# Patient Record
Sex: Female | Born: 1935 | Race: White | Hispanic: No | Smoking: Never smoker
Health system: Southern US, Community
[De-identification: ages and names within clinical notes are randomized; demographics above are authoritative.]

## PROBLEM LIST (undated history)

## (undated) DIAGNOSIS — M1990 Unspecified osteoarthritis, unspecified site: Secondary | ICD-10-CM

## (undated) DIAGNOSIS — I2510 Atherosclerotic heart disease of native coronary artery without angina pectoris: Secondary | ICD-10-CM

## (undated) DIAGNOSIS — N183 Chronic kidney disease, stage 3 unspecified: Secondary | ICD-10-CM

## (undated) DIAGNOSIS — I779 Disorder of arteries and arterioles, unspecified: Secondary | ICD-10-CM

## (undated) DIAGNOSIS — I4891 Unspecified atrial fibrillation: Secondary | ICD-10-CM

## (undated) DIAGNOSIS — E785 Hyperlipidemia, unspecified: Secondary | ICD-10-CM

## (undated) DIAGNOSIS — N189 Chronic kidney disease, unspecified: Secondary | ICD-10-CM

## (undated) DIAGNOSIS — I5030 Unspecified diastolic (congestive) heart failure: Secondary | ICD-10-CM

## (undated) DIAGNOSIS — I10 Essential (primary) hypertension: Secondary | ICD-10-CM

## (undated) DIAGNOSIS — K219 Gastro-esophageal reflux disease without esophagitis: Secondary | ICD-10-CM

## (undated) DIAGNOSIS — I4821 Permanent atrial fibrillation: Secondary | ICD-10-CM

## (undated) DIAGNOSIS — E039 Hypothyroidism, unspecified: Secondary | ICD-10-CM

## (undated) DIAGNOSIS — D649 Anemia, unspecified: Secondary | ICD-10-CM

## (undated) HISTORY — PX: JOINT REPLACEMENT: SHX530

## (undated) HISTORY — DX: Chronic kidney disease, stage 3 unspecified: N18.30

## (undated) HISTORY — DX: Permanent atrial fibrillation: I48.21

## (undated) HISTORY — PX: NISSEN FUNDOPLICATION: SHX2091

## (undated) HISTORY — PX: APPENDECTOMY: SHX54

## (undated) HISTORY — PX: CARDIAC CATHETERIZATION: SHX172

## (undated) HISTORY — PX: CORONARY STENT PLACEMENT: SHX1402

## (undated) HISTORY — DX: Unspecified diastolic (congestive) heart failure: I50.30

## (undated) HISTORY — DX: Disorder of arteries and arterioles, unspecified: I77.9

## (undated) HISTORY — PX: BACK SURGERY: SHX140

## (undated) HISTORY — PX: THORACOTOMY: SUR1349

## (undated) HISTORY — PX: ABDOMINAL HYSTERECTOMY: SHX81

## (undated) SURGERY — Surgical Case
Anesthesia: *Unknown

---

## 2004-10-03 ENCOUNTER — Ambulatory Visit: Payer: Self-pay | Admitting: Cardiology

## 2004-10-27 ENCOUNTER — Inpatient Hospital Stay: Payer: Self-pay | Admitting: General Practice

## 2004-12-23 ENCOUNTER — Ambulatory Visit: Payer: Self-pay

## 2005-06-25 ENCOUNTER — Ambulatory Visit: Payer: Self-pay

## 2005-08-04 ENCOUNTER — Ambulatory Visit: Payer: Self-pay | Admitting: Unknown Physician Specialty

## 2005-10-19 ENCOUNTER — Ambulatory Visit: Payer: Self-pay | Admitting: Cardiology

## 2005-12-30 ENCOUNTER — Ambulatory Visit: Payer: Self-pay

## 2006-07-09 ENCOUNTER — Ambulatory Visit: Payer: Self-pay | Admitting: Specialist

## 2006-09-29 ENCOUNTER — Ambulatory Visit: Payer: Self-pay | Admitting: Cardiology

## 2006-11-03 ENCOUNTER — Ambulatory Visit: Payer: Self-pay | Admitting: Internal Medicine

## 2006-12-29 ENCOUNTER — Ambulatory Visit: Payer: Self-pay | Admitting: Specialist

## 2007-01-07 ENCOUNTER — Ambulatory Visit: Payer: Self-pay | Admitting: Specialist

## 2007-01-22 ENCOUNTER — Ambulatory Visit: Payer: Self-pay | Admitting: Internal Medicine

## 2007-06-24 ENCOUNTER — Ambulatory Visit: Payer: Self-pay | Admitting: Specialist

## 2007-08-22 ENCOUNTER — Ambulatory Visit: Payer: Self-pay | Admitting: Oncology

## 2007-08-25 ENCOUNTER — Ambulatory Visit: Payer: Self-pay | Admitting: Specialist

## 2007-09-07 ENCOUNTER — Ambulatory Visit: Payer: Self-pay | Admitting: Oncology

## 2007-09-22 ENCOUNTER — Ambulatory Visit: Payer: Self-pay | Admitting: Oncology

## 2007-10-04 ENCOUNTER — Ambulatory Visit: Payer: Self-pay | Admitting: Otolaryngology

## 2007-10-04 ENCOUNTER — Other Ambulatory Visit: Payer: Self-pay

## 2007-11-24 ENCOUNTER — Ambulatory Visit: Payer: Self-pay | Admitting: Otolaryngology

## 2007-11-29 ENCOUNTER — Ambulatory Visit: Payer: Self-pay | Admitting: Otolaryngology

## 2007-12-01 ENCOUNTER — Ambulatory Visit: Payer: Self-pay | Admitting: Otolaryngology

## 2007-12-23 ENCOUNTER — Ambulatory Visit: Payer: Self-pay | Admitting: Internal Medicine

## 2007-12-27 ENCOUNTER — Ambulatory Visit: Payer: Self-pay | Admitting: General Surgery

## 2007-12-28 ENCOUNTER — Ambulatory Visit: Payer: Self-pay | Admitting: Cardiothoracic Surgery

## 2008-01-04 ENCOUNTER — Inpatient Hospital Stay: Payer: Self-pay | Admitting: General Surgery

## 2008-01-25 ENCOUNTER — Ambulatory Visit: Payer: Self-pay | Admitting: General Surgery

## 2008-09-04 ENCOUNTER — Ambulatory Visit: Payer: Self-pay | Admitting: Gastroenterology

## 2008-10-03 IMAGING — CT CT CHEST W/ CM
1 series · 15 of 31 positions shown, 19 images · non-contrast
Comparison: none

REASON FOR EXAM: Pulmonary nodule
COMMENTS:

[Series 2: soft tissue · axial · 0.61mm/px · z∈[+270,+570]mm · 15 of 66 slices shown, 19 images]
[im 3/66  mediastinal]
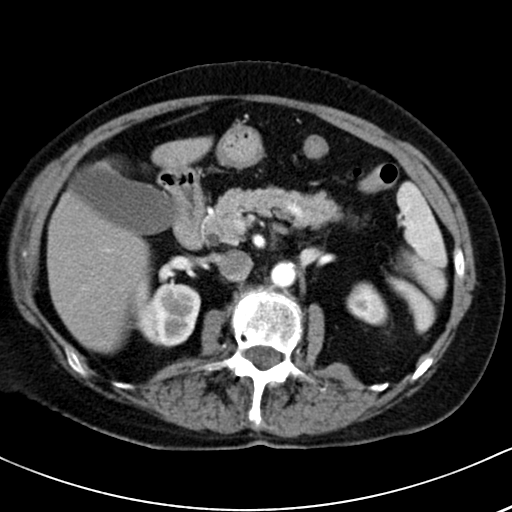
[im 3/66  lung]
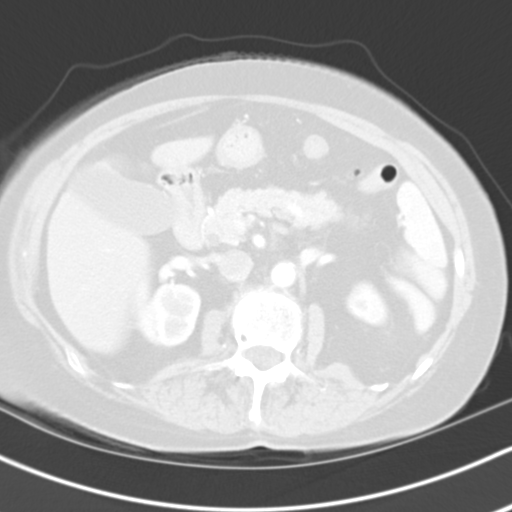
[im 8/66  lung]
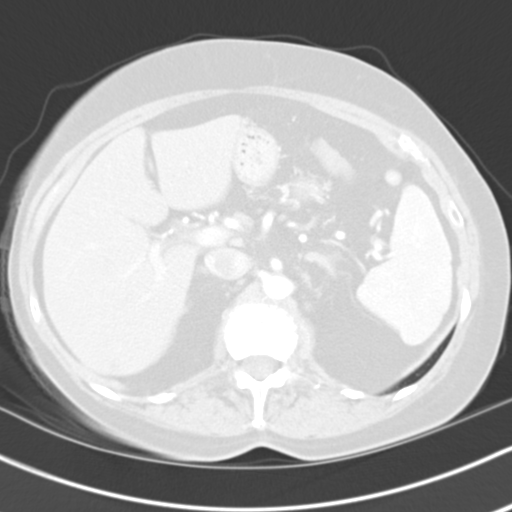
[im 13/66  lung]
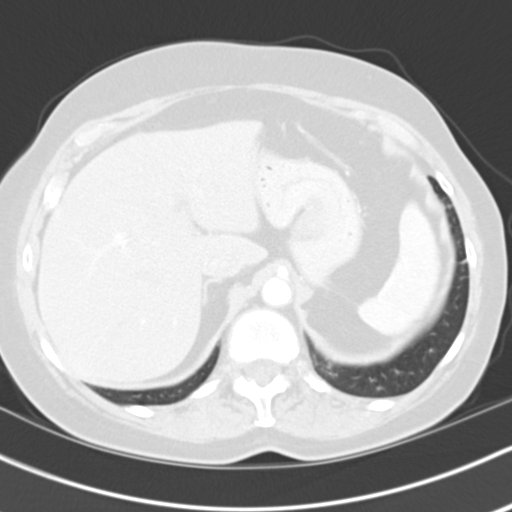
[im 15/66  lung]
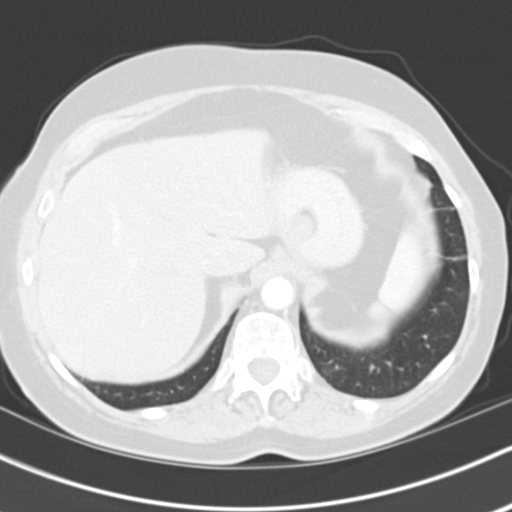
[im 20/66  mediastinal]
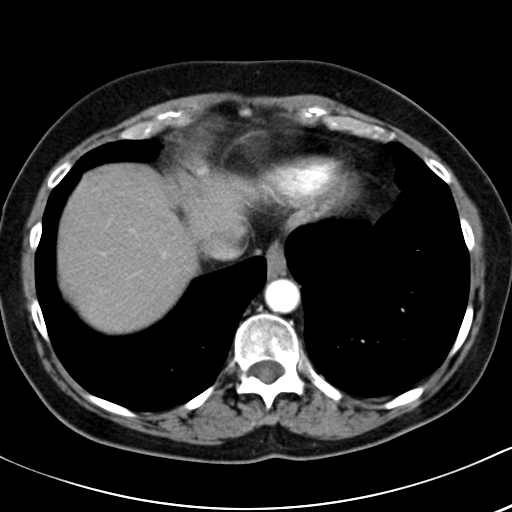
[im 20/66  lung]
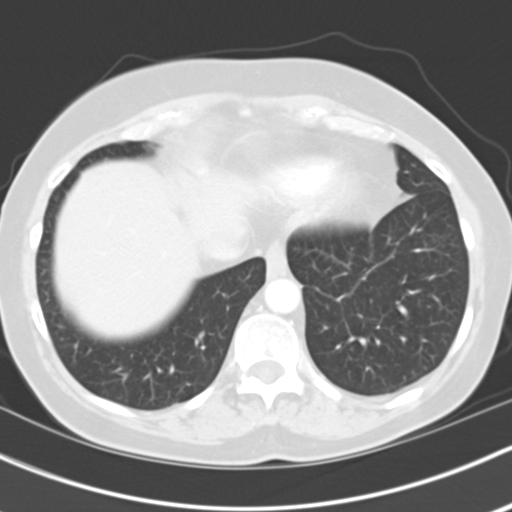
[im 25/66  lung]
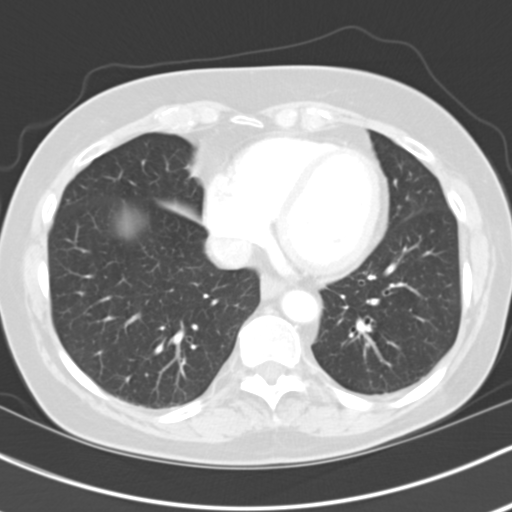
[im 29/66  lung]
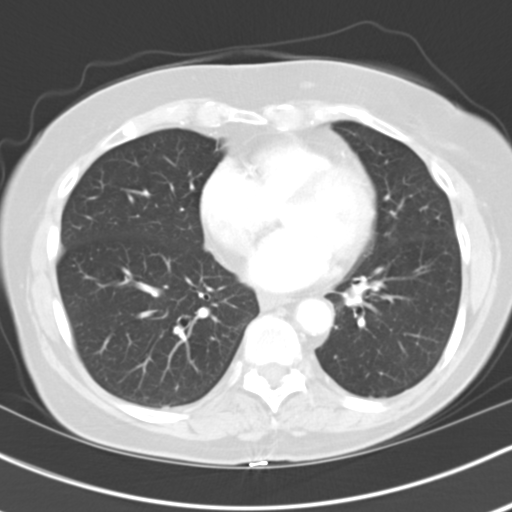
[im 34/66  lung]
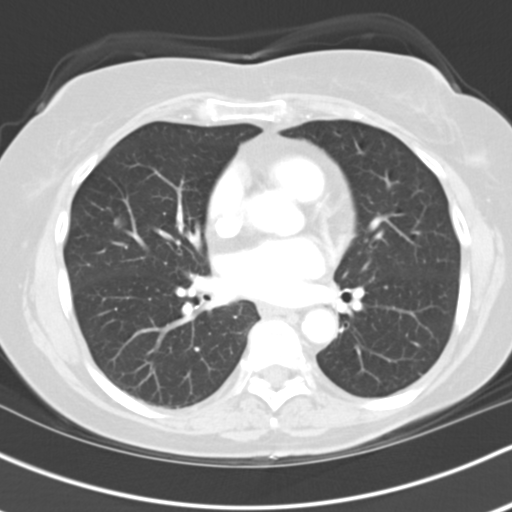
[im 37/66  mediastinal]
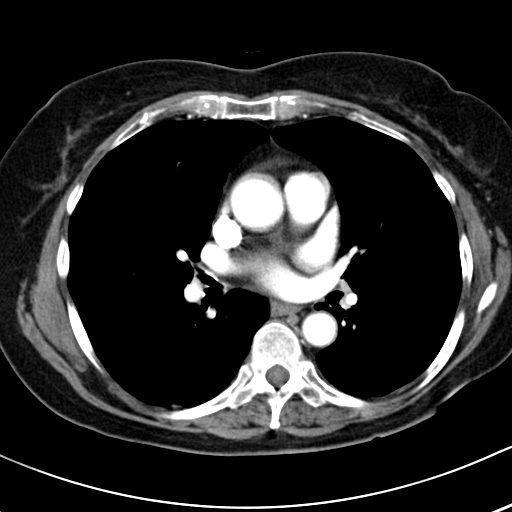
[im 37/66  lung]
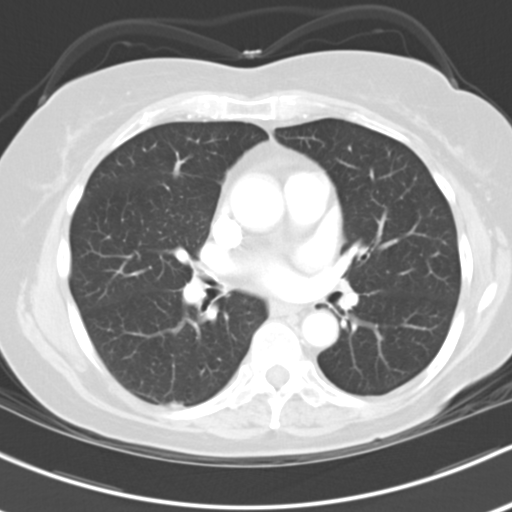
[im 40/66  lung]
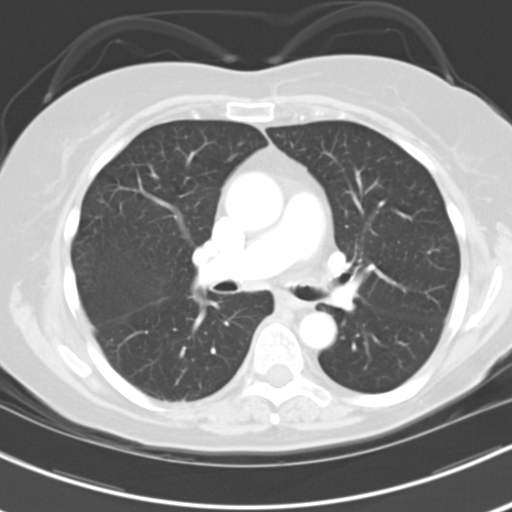
[im 44/66  lung]
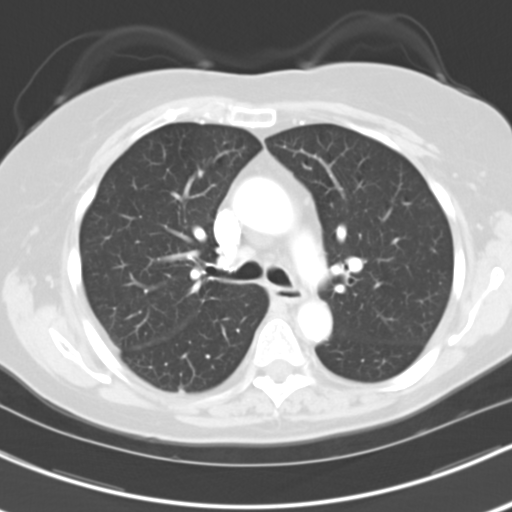
[im 49/66  lung]
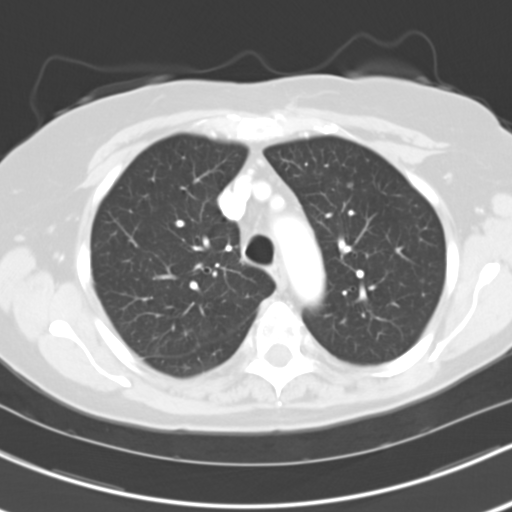
[im 53/66  mediastinal]
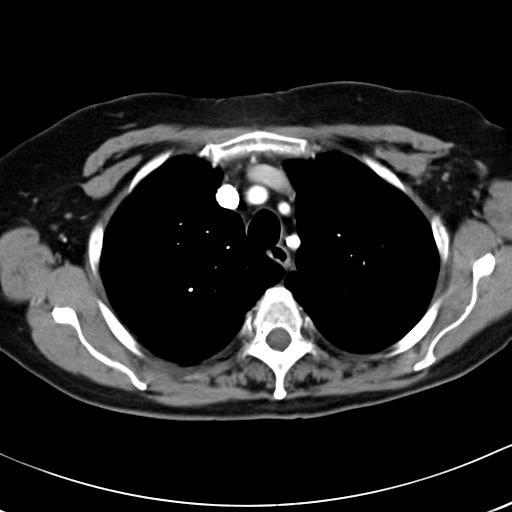
[im 53/66  lung]
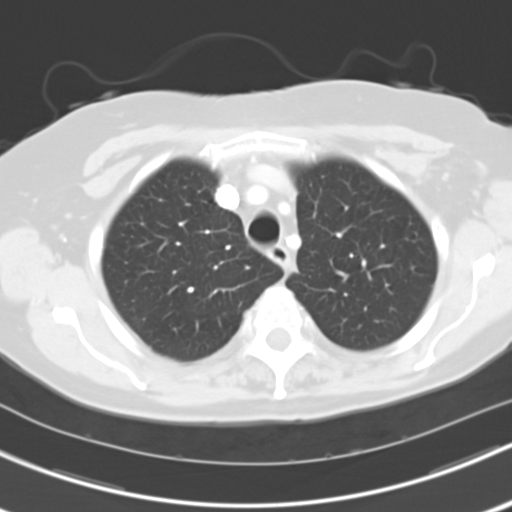
[im 58/66  lung]
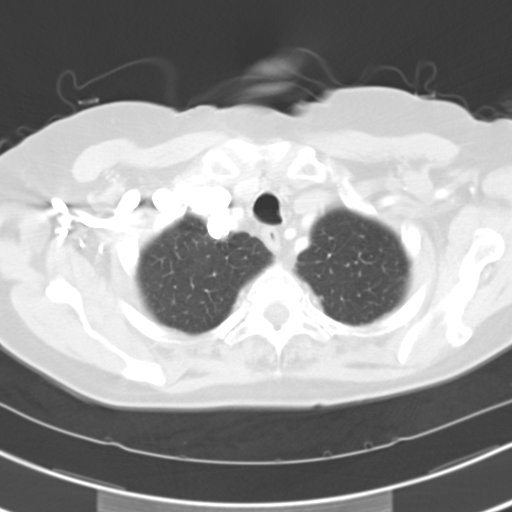
[im 63/66  lung]
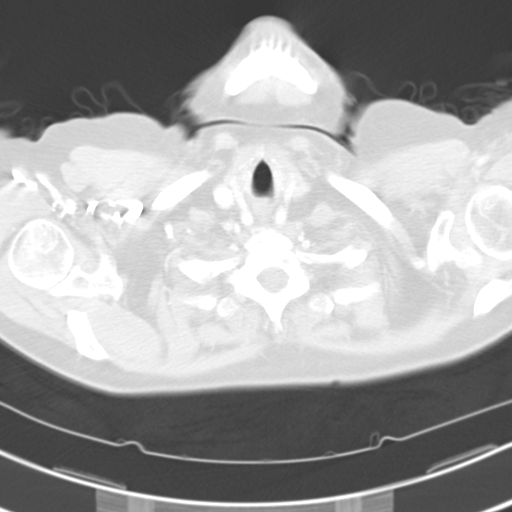

[15 of 31 positions shown; findings below may reference images not displayed]

PROCEDURE:     CT  - CT CHEST WITH CONTRAST  - December 29, 2006  [DATE]

RESULT:     Multislice helical acquisition through the chest during contrast
administration is compared to the previous CT scans of the chest dating back
to 12/23/2004. The RIGHT middle lobe nodular density has increased in size
with a volume now at 490.51 mm3 with a maximal diameter of 12.84 mm.
Originally in 4665 the volume was 280.1 mm with a maximal diameter measured
at 9.05 mm. Given the increase in size of this lesion PET/CT would be
suggested to evaluate for neoplasm. There is no mediastinal or hilar mass.
The axillary regions appear to be unremarkable. No new pulmonary parenchymal
nodules are present. There is no pleural or pericardial effusion. The
adrenal glands are unremarkable. The upper abdominal viscera appear to be
unremarkable. The thoracic aorta is normal in caliber. There is no
dissection.
IMPRESSION: 1.     Enlarging noncalcified mass in the RIGHT middle lobe region. PET/CT
is recommended for further investigation. The lungs otherwise are clear. No
additional nodules are present. There is no mediastinal or hilar mass or
adenopathy.

## 2008-11-19 ENCOUNTER — Ambulatory Visit: Payer: Self-pay | Admitting: Cardiovascular Disease

## 2008-12-19 ENCOUNTER — Encounter: Payer: Self-pay | Admitting: Cardiovascular Disease

## 2008-12-20 ENCOUNTER — Encounter: Payer: Self-pay | Admitting: Cardiovascular Disease

## 2009-01-19 ENCOUNTER — Encounter: Payer: Self-pay | Admitting: Cardiovascular Disease

## 2009-05-14 ENCOUNTER — Ambulatory Visit: Payer: Self-pay | Admitting: Internal Medicine

## 2009-05-28 ENCOUNTER — Ambulatory Visit: Payer: Self-pay | Admitting: Orthopedic Surgery

## 2009-10-09 IMAGING — CR DG CHEST 1V PORT
1 series · 1 of 1 positions shown · non-contrast
Comparison: none

REASON FOR EXAM: Post-Op
COMMENTS:

[view not recorded]
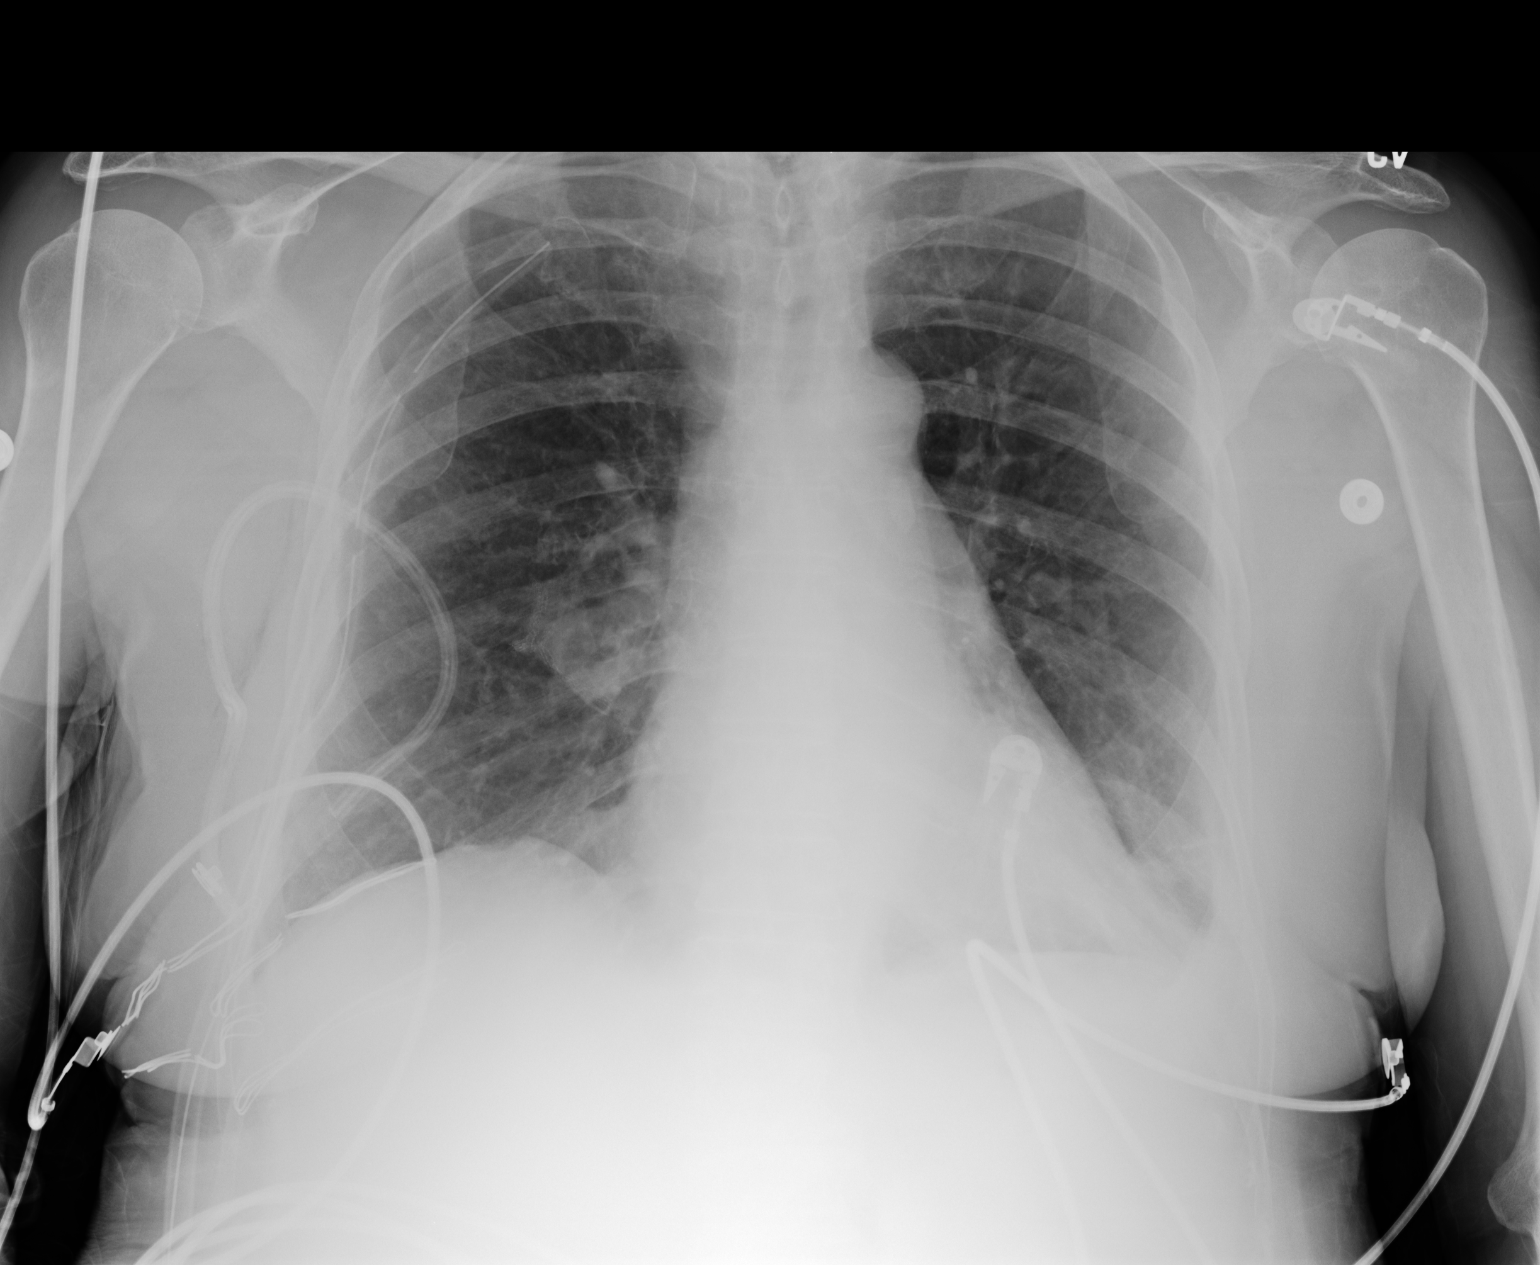

[1 of 1 positions shown; findings below may reference images not displayed]

PROCEDURE:     DXR - DXR PORTABLE CHEST SINGLE VIEW  - January 04, 2008 [DATE]

RESULT:     A RIGHT chest tube is present. No appreciable pneumothorax is
seen. No pleural effusion is noted. There is increased density about the
RIGHT hilum compatible with postoperative change or with atelectasis. Noted
also is mild atelectasis at the LEFT lung base. The pulmonary nodule on the
RIGHT previously noted at chest CT is not definitely appreciated on this
exam.
IMPRESSION: 1.     Please see above.

## 2009-10-10 IMAGING — CR DG CHEST 1V PORT
1 series · 1 of 1 positions shown · non-contrast
Comparison: none

REASON FOR EXAM: Post-Op
COMMENTS:

[view not recorded]
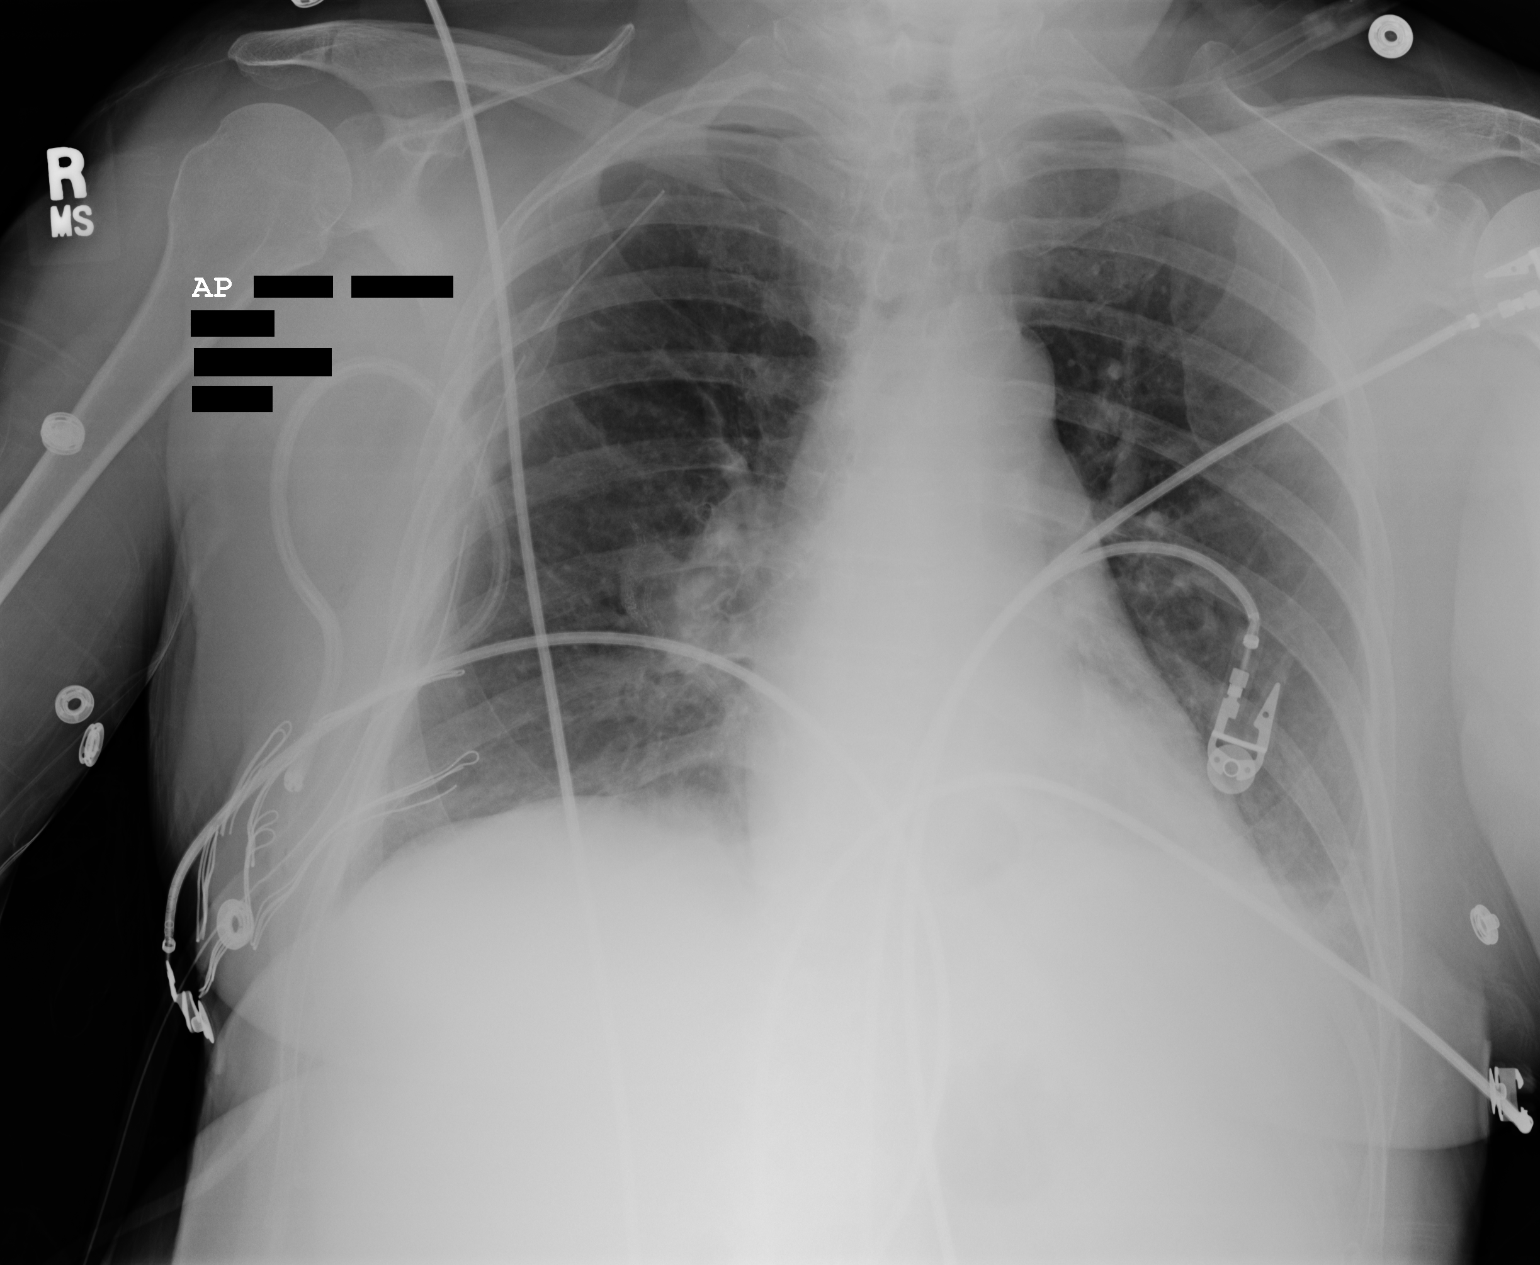

[1 of 1 positions shown; findings below may reference images not displayed]

PROCEDURE:     DXR - DXR PORTABLE CHEST SINGLE VIEW  - January 05, 2008  [DATE]

RESULT:     Comparison is made to the previous exam dated 01/04/2008.

There appears to be a trace LEFT pleural effusion with basilar atelectasis.
There is a RIGHT-sided chest tube. No significant pneumothorax or
subcutaneous emphysema is evident. Cardiac monitoring electrodes are
present. The heart appears to be normal in size.
IMPRESSION: Status post RIGHT thoracotomy with RIGHT chest tube
present. There is no significant pneumothorax. There is minimal LEFT basilar
atelectasis.

## 2010-06-12 ENCOUNTER — Ambulatory Visit: Payer: Self-pay | Admitting: Internal Medicine

## 2010-08-13 ENCOUNTER — Ambulatory Visit: Payer: Self-pay | Admitting: Ophthalmology

## 2010-11-27 ENCOUNTER — Ambulatory Visit: Payer: Self-pay | Admitting: Unknown Physician Specialty

## 2011-06-16 ENCOUNTER — Ambulatory Visit: Payer: Self-pay | Admitting: Internal Medicine

## 2012-06-21 ENCOUNTER — Ambulatory Visit: Payer: Self-pay | Admitting: Internal Medicine

## 2012-11-21 ENCOUNTER — Ambulatory Visit: Payer: Self-pay | Admitting: Nurse Practitioner

## 2013-03-08 ENCOUNTER — Ambulatory Visit: Payer: Self-pay | Admitting: Internal Medicine

## 2013-04-10 ENCOUNTER — Ambulatory Visit: Payer: Self-pay | Admitting: General Practice

## 2013-04-10 LAB — URINALYSIS, COMPLETE
Bacteria: NONE SEEN
Bilirubin,UR: NEGATIVE
Blood: NEGATIVE
Glucose,UR: >=500 mg/dL (ref 0–75)
Ketone: NEGATIVE
Leukocyte Esterase: NEGATIVE
Protein: NEGATIVE
Specific Gravity: 1.015 (ref 1.003–1.030)
Squamous Epithelial: 3

## 2013-04-10 LAB — BASIC METABOLIC PANEL
Calcium, Total: 9.3 mg/dL (ref 8.5–10.1)
Chloride: 104 mmol/L (ref 98–107)
Co2: 31 mmol/L (ref 21–32)
Creatinine: 1.19 mg/dL (ref 0.60–1.30)
EGFR (Non-African Amer.): 43 — ABNORMAL LOW
Osmolality: 279 (ref 275–301)
Sodium: 138 mmol/L (ref 136–145)

## 2013-04-10 LAB — CBC
MCH: 30.1 pg (ref 26.0–34.0)
RBC: 3.88 10*6/uL (ref 3.80–5.20)
RDW: 14.1 % (ref 11.5–14.5)

## 2013-04-10 LAB — SEDIMENTATION RATE: Erythrocyte Sed Rate: 21 mm/hr (ref 0–30)

## 2013-04-10 LAB — PROTIME-INR: INR: 0.9

## 2013-04-24 ENCOUNTER — Inpatient Hospital Stay: Payer: Self-pay | Admitting: General Practice

## 2013-04-25 LAB — BASIC METABOLIC PANEL
Anion Gap: 4 — ABNORMAL LOW (ref 7–16)
Calcium, Total: 7.6 mg/dL — ABNORMAL LOW (ref 8.5–10.1)
Chloride: 103 mmol/L (ref 98–107)
EGFR (African American): >60
EGFR (Non-African Amer.): >60
Glucose: 113 mg/dL — ABNORMAL HIGH (ref 65–99)
Potassium: 3.1 mmol/L — ABNORMAL LOW (ref 3.5–5.1)
Sodium: 135 mmol/L — ABNORMAL LOW (ref 136–145)

## 2013-04-25 LAB — HEMOGLOBIN: HGB: 8.2 g/dL — ABNORMAL LOW (ref 12.0–16.0)

## 2013-04-25 LAB — PLATELET COUNT: Platelet: 136 10*3/uL — ABNORMAL LOW (ref 150–440)

## 2013-04-26 LAB — BASIC METABOLIC PANEL
Anion Gap: 6 — ABNORMAL LOW (ref 7–16)
Calcium, Total: 7.9 mg/dL — ABNORMAL LOW (ref 8.5–10.1)
Chloride: 108 mmol/L — ABNORMAL HIGH (ref 98–107)
Creatinine: 0.83 mg/dL (ref 0.60–1.30)
EGFR (African American): >60
Potassium: 3.5 mmol/L (ref 3.5–5.1)
Sodium: 140 mmol/L (ref 136–145)

## 2013-04-26 LAB — PLATELET COUNT: Platelet: 138 10*3/uL — ABNORMAL LOW (ref 150–440)

## 2013-04-26 LAB — PATHOLOGY REPORT

## 2013-04-26 LAB — HEMOGLOBIN: HGB: 7.6 g/dL — ABNORMAL LOW (ref 12.0–16.0)

## 2013-07-11 ENCOUNTER — Ambulatory Visit: Payer: Self-pay | Admitting: Internal Medicine

## 2014-01-29 ENCOUNTER — Ambulatory Visit (INDEPENDENT_AMBULATORY_CARE_PROVIDER_SITE_OTHER): Payer: Medicare Other | Admitting: Podiatry

## 2014-01-29 VITALS — BP 133/63 | HR 76 | Resp 20 | Ht 61.0 in | Wt 140.0 lb

## 2014-01-29 DIAGNOSIS — M79609 Pain in unspecified limb: Secondary | ICD-10-CM

## 2014-01-29 DIAGNOSIS — B351 Tinea unguium: Secondary | ICD-10-CM

## 2014-01-29 NOTE — Progress Notes (Signed)
   Subjective:    Patient ID: Karen MagesColleen B Henes, female    DOB: [birth year], 80 y.o.   MRN: [id number]  HPI Comments: Its my big toenails. They do not hurt. They are very thick. i cut my toenails.     Review of Systems  HENT: Positive for hearing loss and sneezing.   Eyes: Positive for redness.  Respiratory: Positive for wheezing.   All other systems reviewed and are negative.      Objective:   Physical Exam: I have reviewed her past medical history medications allergies surgeries social history and review of systems. Pulses are strongly palpable bilateral no of the lesions are noted. Her nails are thick yellow dystrophic with mycotic and painful palpation.        Assessment & Plan:  Assessment: Pain in limb secondary to onychomycosis 1 through 5 bilateral.  Plan: Debridement of nails 1 through 5 bilateral.

## 2014-04-23 ENCOUNTER — Ambulatory Visit: Payer: Self-pay | Admitting: Unknown Physician Specialty

## 2014-04-23 LAB — BASIC METABOLIC PANEL
Anion Gap: 1 — ABNORMAL LOW (ref 7–16)
BUN: 17 mg/dL (ref 7–18)
CO2: 27 mmol/L (ref 21–32)
CREATININE: 1.15 mg/dL (ref 0.60–1.30)
Calcium, Total: 9 mg/dL (ref 8.5–10.1)
Chloride: 106 mmol/L (ref 98–107)
EGFR (African American): 52 — ABNORMAL LOW
EGFR (Non-African Amer.): 45 — ABNORMAL LOW
Glucose: 105 mg/dL — ABNORMAL HIGH (ref 65–99)
OSMOLALITY: 270 (ref 275–301)
Potassium: 3.2 mmol/L — ABNORMAL LOW (ref 3.5–5.1)
Sodium: 134 mmol/L — ABNORMAL LOW (ref 136–145)

## 2014-04-23 LAB — CBC WITH DIFFERENTIAL/PLATELET
BASOS PCT: 0.7 %
Basophil #: 0.1 10*3/uL (ref 0.0–0.1)
Eosinophil #: 0.1 10*3/uL (ref 0.0–0.7)
Eosinophil %: 1 %
HCT: 35.1 % (ref 35.0–47.0)
HGB: 11.5 g/dL — AB (ref 12.0–16.0)
LYMPHS PCT: 10.3 %
Lymphocyte #: 1 10*3/uL (ref 1.0–3.6)
MCH: 28 pg (ref 26.0–34.0)
MCHC: 32.8 g/dL (ref 32.0–36.0)
MCV: 85 fL (ref 80–100)
Monocyte #: 0.6 x10 3/mm (ref 0.2–0.9)
Monocyte %: 6.1 %
Neutrophil #: 8.4 10*3/uL — ABNORMAL HIGH (ref 1.4–6.5)
Neutrophil %: 81.9 %
PLATELETS: 314 10*3/uL (ref 150–440)
RBC: 4.12 10*6/uL (ref 3.80–5.20)
RDW: 15.3 % — ABNORMAL HIGH (ref 11.5–14.5)
WBC: 10.2 10*3/uL (ref 3.6–11.0)

## 2014-04-24 ENCOUNTER — Ambulatory Visit: Payer: Self-pay | Admitting: Unknown Physician Specialty

## 2014-04-25 LAB — PATHOLOGY REPORT

## 2014-05-07 ENCOUNTER — Ambulatory Visit (INDEPENDENT_AMBULATORY_CARE_PROVIDER_SITE_OTHER): Payer: Medicare Other | Admitting: Podiatry

## 2014-05-07 ENCOUNTER — Encounter: Payer: Self-pay | Admitting: Podiatry

## 2014-05-07 DIAGNOSIS — M79676 Pain in unspecified toe(s): Secondary | ICD-10-CM

## 2014-05-07 DIAGNOSIS — B351 Tinea unguium: Secondary | ICD-10-CM

## 2014-05-07 DIAGNOSIS — M79609 Pain in unspecified limb: Secondary | ICD-10-CM

## 2014-05-07 NOTE — Progress Notes (Signed)
She presents today chief complaint of painful elongated toenails one through 5 bilateral. ° °Objective: Nails are thick yellow dystrophic with mycotic and painful palpation. ° °Assessment: Pain in limb secondary to onychomycosis 1 through 5 bilateral. ° °Plan: Debridement nails 1 through 5 bilateral. °

## 2014-07-13 ENCOUNTER — Ambulatory Visit: Payer: Self-pay | Admitting: Internal Medicine

## 2014-08-06 ENCOUNTER — Ambulatory Visit (INDEPENDENT_AMBULATORY_CARE_PROVIDER_SITE_OTHER): Payer: Medicare Other | Admitting: Podiatry

## 2014-08-06 DIAGNOSIS — B351 Tinea unguium: Secondary | ICD-10-CM | POA: Diagnosis not present

## 2014-08-06 DIAGNOSIS — M79676 Pain in unspecified toe(s): Secondary | ICD-10-CM | POA: Diagnosis not present

## 2014-08-06 NOTE — Progress Notes (Signed)
She presents today chief complaint of painful elongated toenails one through 5 bilateral.  Objective: Nails are thick yellow dystrophic with mycotic and painful palpation.  Assessment: Pain in limb secondary to onychomycosis 1 through 5 bilateral.  Plan: Debridement nails 1 through 5 bilateral.

## 2014-09-11 ENCOUNTER — Emergency Department: Payer: Self-pay | Admitting: Emergency Medicine

## 2014-11-12 ENCOUNTER — Ambulatory Visit: Payer: Medicare Other

## 2014-11-12 ENCOUNTER — Ambulatory Visit: Payer: Medicare Other | Admitting: Podiatry

## 2015-01-07 ENCOUNTER — Ambulatory Visit (INDEPENDENT_AMBULATORY_CARE_PROVIDER_SITE_OTHER): Payer: Medicare Other | Admitting: Podiatry

## 2015-01-07 DIAGNOSIS — B351 Tinea unguium: Secondary | ICD-10-CM | POA: Diagnosis not present

## 2015-01-07 DIAGNOSIS — M79676 Pain in unspecified toe(s): Secondary | ICD-10-CM | POA: Diagnosis not present

## 2015-01-07 NOTE — Progress Notes (Signed)
She presents today chief complaint of painful elongated toenails one through 5 bilateral. ° °Objective: Nails are thick yellow dystrophic with mycotic and painful palpation. ° °Assessment: Pain in limb secondary to onychomycosis 1 through 5 bilateral. ° °Plan: Debridement nails 1 through 5 bilateral. °

## 2015-01-11 NOTE — Discharge Summary (Signed)
PATIENT NAME:  Karen Cunningham, Karen Cunningham MR#:  [id number] DATE OF BIRTH:  07/25/1931  DATE OF ADMISSION:  04/24/2013 DATE OF DISCHARGE:    ADMITTING DIAGNOSIS: Degenerative arthrosis of the left hip.   DISCHARGE DIAGNOSIS: Degenerative arthrosis of the left hip.   HISTORY: The patient is a pleasant 81 year old, who has been [facility] for progression of left hip pain. Her pain was noted to increase with activity such as weight-bearing. The patient did receive an intra-articular cortisone injection to the left hip , which gave her no significant improvement. She was having difficulty with home exercises. She recently was noted to have hypokalemia. She subsequently was started on Klor-Con. The patient states that her pain had increased to the point that it was significantly interfering with her activities and ability to get around the house. [facility] taken in [facility] showed severe degenerative arthrosis. After discussion of the risks and benefits of surgical intervention, the patient expressed her understanding of the risks and benefits and agreed for plans for surgical intervention.   HOSPITAL COURSE:   PROCEDURE: Left total hip arthroplasty.   ANESTHESIA: Spinal.   IMPLANTS UTILIZED: DePuy 12 mm small stature AML femoral stem, a 52 mm outer diameter Pinnacle 100 acetabular component, + 4 mm 10 degree Pinnacle Marathon polyethylene liner, and a 36 mm outer diameter M-Spec femoral head with a + 1.5 mm neck length.   The patient tolerated the procedure very well. She had no complications. She was then taken to the PACU where she was stabilized and then transferred to the orthopedic floor. The patient began receiving anticoagulation therapy of Lovenox 30 mg subcutaneous every 12 hours per anesthesia and pharmacy protocol. She was fitted with TED stockings bilaterally. These were allowed to be removed 1 hour per 8 hour shift. She was also fitted with AVI compression foot pumps  bilaterally set at 80 mmHg. Her calves were nontender. There has been no evidence of any deep venous thromboses. Heels were elevated off the bed using rolled towels.   The patient has denied any chest pain or shortness of breath. She has been asymptomatic. Her vital signs have been stable. No transfusions were needed following surgery. However, hemoglobin did drop to 7.5, but she was asymptomatic and she had no dizziness or any weakness. Followup hemoglobin showed this to be 8.6. The patient has been afebrile throughout the hospital course. There have been no complications.   Physical therapy was initiated on day 1 for gait training and transfers. Upon being discharged, she was ambulating greater than 200 feet. She was able go up and down 4 sets of steps. She was independent with bed to chair transfers. She could recall her 4 hip precautions. Occupational therapy was also initiated on day 1 for ADLs and assistive devices.   The patient's IV, Foley and Hemovac were discontinued on day 2 along with a dressing change. The wound was free of any drainage or any signs of infection.   DISPOSITION: The patient is being discharged to home in improved and stable condition.   DISCHARGE INSTRUCTIONS:  1.  She may weight bear as tolerated. 2.  She was put on hip precautions once again.  3.  Continue to use a walker until cleared by physical therapy to go to a quad cane.  4.  She will receive home health physical therapy.  5.  TED stockings are to be worn during the day, but may be removed at night.  6.  She was instructed in  wound care.  7.  Staples will be removed on 08/18 followed by application of benzoin and Steri-Strips.  8.  She has a follow-up appointment on 09/16 at 1:30 with Dr. Ernest Pine. Call sooner if any temperatures of 101.5 or greater or excessive bleeding.  9.  She is placed on a regular diet.  10. She is to resume her regular medication that she was on prior to admission. She; however, is to  start her aspirin and Plavix after finishing the Lovenox. She was given a prescription for Lovenox 40 mg subcutaneously daily for 14 days. Also, a prescription for oxycodone 5 to 10 mg every 4 to 6 hours p.r.n. for pain and tramadol 50 to 100 mg every 4 to 6 hours p.r.n. for pain.   PAST MEDICAL HISTORY:  1.  Arthritis. 2.  Cataracts. 3.  Hyperlipidemia. 4.  Coronary artery disease, status post stent.  5.  Hypertension.  6.  Diverticulosis.  7.  Gastroesophageal reflux disease. 8.  Thyroid disease.  ____________________________ Van Clines, PA jrw:aw D: 04/27/2013 07:05:13 ET T: 04/27/2013 08:31:15 ET JOB#: [id number]  cc: Van Clines, PA, <Dictator> Cathlene Gardella PA ELECTRONICALLY SIGNED 04/28/2013 8:27

## 2015-01-11 NOTE — Op Note (Signed)
PATIENT NAME:  Karen Cunningham, Karen Cunningham MR#:  [id number] DATE OF BIRTH:  05/21/1931  DATE OF PROCEDURE:  04/24/2013  PREOPERATIVE DIAGNOSIS: Degenerative arthrosis of the left hip.   POSTOPERATIVE DIAGNOSIS: Degenerative arthrosis of the left hip.   PROCEDURE PERFORMED: Left total hip arthroplasty.   SURGEON: Dr. Francesco Sor.   ASSISTANT: Van Clines, PA (required to maintain retraction throughout the procedure).   ANESTHESIA: Spinal.   ESTIMATED BLOOD LOSS: 450 mL.   FLUIDS REPLACED: 2000 mL of crystalloid.   DRAINS: Two medium drains to Hemovac reservoir.   IMPLANTS UTILIZED: DePuy 12 mm small-stature AML femoral stem, a 52 mm outer diameter Pinnacle 100 acetabular component, +4 mm, 10-degree Pinnacle Marathon polyethylene liner, and a 36 mm outer diameter M-SPEC femoral head with a +1.5 mm neck length.   INDICATIONS FOR SURGERY: The patient is an 81 year old female who has been seen for complaints of progressive left hip and groin pain. X-rays demonstrated severe degenerative arthrosis. After discussion of the risks and benefits of surgical intervention, the patient expressed understanding of the risks and benefits and agreed with plans for surgical intervention.   PROCEDURE IN DETAIL: The patient was brought into the operating room, and after adequate spinal anesthesia was achieved the patient was placed in a right lateral decubitus position. Axillary roll was placed, and all bony prominences were well-padded. The patient's left hip and leg were cleaned and prepped with alcohol and DuraPrep, draped in the usual sterile fashion. A "time-out" was performed as per usual protocol. A lateral curvilinear incision was made gently, curving towards the posterior superior iliac spine. IT band was incised in-line with the skin incision. Fibers of the gluteus maximus were split in-line. The piriformis tendon was identified, skeletonized, and incised at its insertion on the proximal femur and reflected  posteriorly. In a similar fashion, short external rotators were incised and reflected posteriorly. A T-type posterior capsulotomy was performed. Prior to dislocation of the femoral head a threaded Steinmann pin was inserted through a separate stab incision into the pelvis superior to the acetabulum and then bent in the form of a stylus so as to assess limb length and hip offset throughout the procedure.   The femoral head was then dislocated posteriorly. Inspection of the femoral head demonstrated severe degenerative changes, most notably to the superior aspect. The femoral neck cut was performed using an oscillating saw. The anterior capsule was elevated off the femoral neck. Inspection of the acetabulum also demonstrated degenerative changes. The labrum was excised using electrocautery. The acetabulum was reamed in a sequential fashion up to a 51 mm diameter. Excellent punctate, bleeding bone was encountered. The wound was irrigated with copious amounts of normal saline with antibiotic solution. A 52 mm outer diameter Pinnacle 100 acetabular component was positioned and impacted into place. Excellent scratch-fit was appreciated. A +4 neutral polyethylene trial was inserted and attention was directed  to the proximal femur.   A pilot hole for reaming of the proximal femoral canal was created using a high-speed bur. Proximal femoral canal was then reamed in a sequential fashion up to an 11.5 mm diameter. This allowed for approximately 6.5 cm of scratch-fit. A 12 mm aggressive side-biting reamers was used to prepare the proximal femur. Serial broaches were inserted up to a 12 mm small-stature broach. The calcar region was planed and a trial reduction was performed with a 36 mm ball with +1.5 mm neck length. Good equalization of limb lengths was appreciated, and good stability was appreciated anteriorly and posteriorly.  The hip was dislocated again and a +4 mm 10-degree trial liner was positioned with the high  side at approximately 4 o'clock position and stability again assessed. Improved posterior stability was appreciated with the 10-degree offset.   The trial components were removed. The acetabular shell was irrigated and suctioned dry. A +4 mm, 10-degree Pinnacle Marathon polyethylene liner was positioned with the high side at approximately the 4 o'clock position and impacted into place. Next, a 12 mm small-stature AML femoral component was positioned and impacted into place. Excellent scratch-fit was appreciated. Trial reduction was again performed with a 36 mm hip ball with a +1.5 mm neck length. Again, excellent stability was appreciated both anteriorly and posteriorly.   Good equalization of limb lengths was noted and good restoration of hip offset was appreciated. Trial hip ball was removed. The Morse taper was cleaned and dried. A 36 mm M-SPEC femoral head with a +1.5 mm neck length was placed on the trunnion and impacted into place. The hip was reduced and placed through a range of motion. Again, excellent stability was appreciated anteriorly and posteriorly.   The wound was irrigated with copious amounts of normal saline with antibiotic solution using pulsatile lavage and then suctioned dry. The posterior capsulotomy was repaired using #5 Ethibond.   The piriformis tendon was reapproximated on the undersurface of the gluteus medius tendon using #5 Ethibond. Two medium drains were placed in the wound bed and brought out through a separate stab incision, to be attached to a Hemovac reservoir. IT band was repaired using interrupted sutures of #1 Vicryl. The subcutaneous tissues were approximated in layers using, first, a #0 Vicryl, followed by a #2-0 Vicryl. Skin was closed with skin staples. A sterile dressing was applied.   The patient tolerated the procedure well. She was transported to the recovery room in stable condition.    ____________________________ Illene LabradorJames P. Angie FavaHooten Jr.,  MD jph:dm D: 04/24/2013 10:57:05 ET T: 04/24/2013 11:12:20 ET JOB#: [id number]  cc: Fayrene FearingJames P. Angie FavaHooten Jr., MD, <Dictator> Illene LabradorJAMES P Angie FavaHOOTEN JR MD ELECTRONICALLY SIGNED 04/30/2013 17:23

## 2015-01-12 NOTE — Op Note (Signed)
PATIENT NAME:  Karen Cunningham, Buffi B MR#:  [id number] DATE OF BIRTH:  13-Mar-1931  DATE OF PROCEDURE:  04/24/2014  PREOPERATIVE DIAGNOSIS: Left temporal arteritis.   POSTOPERATIVE DIAGNOSIS: Left temporal arteritis.  PROCEDURE: left temporal artery biopsy.   SURGEON: Davina Pokehapman T. Emmaclaire Switala, MD  OPERATIVE FINDINGS: Normal appearing temporal artery.   DESCRIPTION OF PROCEDURE: Jill SideColleen was identified in the holding area, taken to the operating room and placed in the supine position. The table was then turned 90 degrees, after IV sedation. With the left temporal artery exposed and the left temporal area prepped and draped sterilely, a Doppler was used to identify the temporal artery. An incision line was marked. Local anesthetic of 2% plain lidocaine was used to inject over the temporal artery region; approximately 2 mL was used. With the area prepped and draped sterilely, a 15 blade was used to incise down to and through the subcutaneous tissues overlying the temporal artery. A temporal vein was then identified as it ran over top of the temporal artery. This temporal vein was divided between hemostats and removed from the surface of the temporal artery, and this was sent for specimen. The stumps of the temporal vein were suture ligated using 2-0 silk suture ligature. Beneath this, the temporal artery could easily be identified. This was again Dopplered and indeed was pulsatile, consistent with the temporal artery. An approximately 2 cm segment was identified and dissected free superiorly and inferiorly. Clamps were placed across this, and a 2 cm segment was removed. Then 2-0 silk suture ligatures were placed around the hemostats for hemostasis.   With this completed, the wound was then copiously irrigated with saline. Any other bleeding points were cauterized using the microbipolar. With this completed, the skin incision was closed using interrupted 4-0 Prolene.   The patient was then returned to Anesthesia,  where she was awakened in the operating room and taken to the recovery room in stable condition.   CULTURES: None.   SPECIMENS: Left temporal artery and vein.   ESTIMATED BLOOD LOSS: Less than 5 mL.    ____________________________ Davina Pokehapman T. Ellis Mehaffey, MD ctm:ms D: 04/24/2014 09:40:26 ET T: 04/24/2014 10:28:27 ET JOB#: [id number]  cc: Davina Pokehapman T. Anacarolina Evelyn, MD, <Dictator> Davina PokeHAPMAN T Aven Christen MD ELECTRONICALLY SIGNED 05/25/2014 7:44

## 2015-04-09 ENCOUNTER — Ambulatory Visit (INDEPENDENT_AMBULATORY_CARE_PROVIDER_SITE_OTHER): Payer: Medicare Other | Admitting: Podiatry

## 2015-04-09 DIAGNOSIS — B351 Tinea unguium: Secondary | ICD-10-CM | POA: Diagnosis not present

## 2015-04-09 DIAGNOSIS — M79676 Pain in unspecified toe(s): Secondary | ICD-10-CM | POA: Diagnosis not present

## 2015-04-09 NOTE — Progress Notes (Signed)
Subjective: 81 y.o. female returns the office today for painful, elongated, thickened toenails which she is unable to trim herself. Denies any redness or drainage around the nails. Denies any acute changes since last appointment and no new complaints today. Denies any systemic complaints such as fevers, chills, nausea, vomiting.   Objective: AAO 3, NAD DP/PT pulses palpable, CRT less than 3 seconds Nails hypertrophic, dystrophic, elongated, brittle, discolored 10. There is tenderness overlying the nails 1-5 bilaterally. There is no surrounding erythema or drainage along the nail sites. No open lesions or pre-ulcerative lesions are identified. No other areas of tenderness bilateral lower extremities. No overlying edema, erythema, increased warmth. No pain with calf compression, swelling, warmth, erythema.  Assessment: Patient presents with symptomatic onychomycosis  Plan: -Treatment options including alternatives, risks, complications were discussed -Nails sharply debrided 10 without complication/bleeding. -Discussed daily foot inspection. If there are any changes, to call the office immediately.  -Follow-up in 3 months or sooner if any problems are to arise. In the meantime, encouraged to call the office with any questions, concerns, changes symptoms.   Ovid CurdMatthew Wagoner, DPM

## 2015-06-09 DIAGNOSIS — M169 Osteoarthritis of hip, unspecified: Secondary | ICD-10-CM | POA: Insufficient documentation

## 2015-06-09 DIAGNOSIS — Z96659 Presence of unspecified artificial knee joint: Secondary | ICD-10-CM | POA: Insufficient documentation

## 2015-06-09 DIAGNOSIS — Z96649 Presence of unspecified artificial hip joint: Secondary | ICD-10-CM | POA: Insufficient documentation

## 2015-07-16 ENCOUNTER — Ambulatory Visit: Payer: Medicare Other | Admitting: Sports Medicine

## 2015-07-16 ENCOUNTER — Ambulatory Visit: Payer: Medicare Other

## 2015-07-22 ENCOUNTER — Ambulatory Visit (INDEPENDENT_AMBULATORY_CARE_PROVIDER_SITE_OTHER): Payer: Medicare Other | Admitting: Podiatry

## 2015-07-22 DIAGNOSIS — B351 Tinea unguium: Secondary | ICD-10-CM

## 2015-07-22 DIAGNOSIS — M79676 Pain in unspecified toe(s): Secondary | ICD-10-CM

## 2015-07-22 NOTE — Progress Notes (Signed)
She presents today chief complaint of painful elongated toenails one through 5 bilateral.  Objective: Nails are thick yellow dystrophic with mycotic and painful palpation.  Assessment: Pain in limb secondary to onychomycosis 1 through 5 bilateral.  Plan: Debridement nails 1 through 5 bilateral. 3 mo.  Arbutus Pedodd Hyatt DPM

## 2015-08-23 ENCOUNTER — Other Ambulatory Visit: Payer: Self-pay | Admitting: Internal Medicine

## 2015-08-23 DIAGNOSIS — Z1231 Encounter for screening mammogram for malignant neoplasm of breast: Secondary | ICD-10-CM

## 2015-08-30 ENCOUNTER — Ambulatory Visit: Payer: Self-pay | Attending: Internal Medicine

## 2015-10-21 ENCOUNTER — Ambulatory Visit: Payer: Medicare Other

## 2015-10-21 ENCOUNTER — Encounter: Payer: Self-pay | Admitting: Podiatry

## 2015-10-21 ENCOUNTER — Encounter: Payer: Medicare Other | Admitting: Podiatry

## 2015-10-22 NOTE — Progress Notes (Signed)
This encounter was created in error - please disregard.

## 2015-10-30 ENCOUNTER — Ambulatory Visit: Payer: Medicare Other | Admitting: Podiatry

## 2015-11-08 ENCOUNTER — Ambulatory Visit (INDEPENDENT_AMBULATORY_CARE_PROVIDER_SITE_OTHER): Payer: Medicare Other | Admitting: Sports Medicine

## 2015-11-08 ENCOUNTER — Encounter: Payer: Self-pay | Admitting: Sports Medicine

## 2015-11-08 DIAGNOSIS — M79676 Pain in unspecified toe(s): Secondary | ICD-10-CM | POA: Diagnosis not present

## 2015-11-08 DIAGNOSIS — B351 Tinea unguium: Secondary | ICD-10-CM

## 2015-11-08 NOTE — Progress Notes (Signed)
Patient ID: Karen Cunningham, female   DOB: July 12, 1931, 82 y.o.   MRN: [id number] Subjective: Karen Cunningham is a 82 y.o. female patient seen today in office with complaint of painful thickened and elongated toenails; unable to trim. Patient denies history of Diabetes, Neuropathy, or Vascular disease. Patient has no other pedal complaints at this time.   There are no active problems to display for this patient.  Current Outpatient Prescriptions on File Prior to Visit  Medication Sig Dispense Refill  . amiodarone (PACERONE) 200 MG tablet Take 200 mg by mouth daily.    Marland Kitchen amLODipine (NORVASC) 10 MG tablet Take 10 mg by mouth daily.    Marland Kitchen aspirin EC 81 MG tablet Take 81 mg by mouth daily.    . Cholecalciferol (VITAMIN D3) 1000 UNITS CAPS Take by mouth daily.    . clopidogrel (PLAVIX) 75 MG tablet Take 75 mg by mouth daily with breakfast.    . Ferrous Sulfate (IRON) 325 (65 FE) MG TABS Take by mouth daily.    Marland Kitchen levothyroxine (SYNTHROID, LEVOTHROID) 88 MCG tablet Take 88 mcg by mouth daily before breakfast.    . losartan-hydrochlorothiazide (HYZAAR) 100-12.5 MG per tablet Take 1 tablet by mouth daily.    . ranitidine (ZANTAC) 150 MG capsule Take 150 mg by mouth 2 (two) times daily.    . simvastatin (ZOCOR) 20 MG tablet Take 20 mg by mouth daily.     No current facility-administered medications on file prior to visit.   Allergies  Allergen Reactions  . Codeine Other (See Comments)    unknown  . Codeine Sulfate Nausea Only  . Ibuprofen Other (See Comments)    INSTRUCTED NOT TO TAKE BY KIDNEY DOCTOR.  . Prednisone Other (See Comments)    ELEVATED BLOOD PRESSURE. unknown     Objective: Physical Exam  General: Well developed, nourished, no acute distress, awake, alert and oriented x 3  Vascular: Dorsalis pedis artery 1/4 bilateral, Posterior tibial artery 1/4 bilateral, skin temperature warm to warm proximal to distal bilateral lower extremities, + varicosities, pedal hair present  bilateral.  Neurological: Gross sensation present via light touch bilateral.   Dermatological: Skin is warm, dry, and supple bilateral, Nails 1-10 are tender, long, thick, and discolored with mild subungal debris, no webspace macerations present bilateral, no open lesions present bilateral, no callus/corns/hyperkeratotic tissue present bilateral. No signs of infection bilateral.  Musculoskeletal: No boney deformities noted bilateral. Muscular strength within normal limits without pain or limitation on range of motion. No pain with calf compression bilateral.  Assessment and Plan:  Problem List Items Addressed This Visit    None    Visit Diagnoses    Dermatophytosis of nail    -  Primary    Pain of toe, unspecified laterality          -Examined patient.  -Discussed treatment options for painful mycotic nails. -Mechanically debrided and reduced mycotic nails with sterile nail nipper and dremel nail file without incident. -Patient to return in 3 months  for follow up evaluation or sooner if symptoms worsen.  Asencion Islam, DPM

## 2016-02-11 ENCOUNTER — Ambulatory Visit (INDEPENDENT_AMBULATORY_CARE_PROVIDER_SITE_OTHER): Payer: Medicare Other | Admitting: Sports Medicine

## 2016-02-11 ENCOUNTER — Encounter: Payer: Self-pay | Admitting: Sports Medicine

## 2016-02-11 DIAGNOSIS — B351 Tinea unguium: Secondary | ICD-10-CM | POA: Diagnosis not present

## 2016-02-11 DIAGNOSIS — M79676 Pain in unspecified toe(s): Secondary | ICD-10-CM

## 2016-02-11 NOTE — Progress Notes (Signed)
Patient ID: Karen Cunningham, female   DOB: 10-17-1930, 82 y.o.   MRN: [id number]   Subjective: Karen Cunningham is a 82 y.o. female patient seen today in office with complaint of painful thickened and elongated toenails; unable to trim. Patient denies any changes with medical history since last encouter. Patient has no other pedal complaints at this time.   Patient Active Problem List   Diagnosis Date Noted  . Degenerative arthritis of hip 06/09/2015  . H/O total knee replacement 06/09/2015  . H/O total hip arthroplasty 06/09/2015   Current Outpatient Prescriptions on File Prior to Visit  Medication Sig Dispense Refill  . amiodarone (PACERONE) 200 MG tablet Take 200 mg by mouth daily.    Marland Kitchen. amLODipine (NORVASC) 10 MG tablet Take 10 mg by mouth daily.    Marland Kitchen. aspirin EC 81 MG tablet Take 81 mg by mouth daily.    . Cholecalciferol (VITAMIN D3) 1000 UNITS CAPS Take by mouth daily.    . clopidogrel (PLAVIX) 75 MG tablet Take 75 mg by mouth daily with breakfast.    . Ferrous Sulfate (IRON) 325 (65 FE) MG TABS Take by mouth daily.    Marland Kitchen. levothyroxine (SYNTHROID, LEVOTHROID) 88 MCG tablet Take 88 mcg by mouth daily before breakfast.    . losartan-hydrochlorothiazide (HYZAAR) 100-12.5 MG per tablet Take 1 tablet by mouth daily.    . ranitidine (ZANTAC) 150 MG capsule Take 150 mg by mouth 2 (two) times daily.    . simvastatin (ZOCOR) 20 MG tablet Take 20 mg by mouth daily.     No current facility-administered medications on file prior to visit.   Allergies  Allergen Reactions  . Codeine Other (See Comments)    unknown  . Codeine Sulfate Nausea Only  . Ibuprofen Other (See Comments)    INSTRUCTED NOT TO TAKE BY KIDNEY DOCTOR.  . Prednisone Other (See Comments)    ELEVATED BLOOD PRESSURE. unknown     Objective: Physical Exam  General: Well developed, nourished, no acute distress, awake, alert and oriented x 3  Vascular: Dorsalis pedis artery 1/4 bilateral, Posterior tibial artery 1/4  bilateral, skin temperature warm to warm proximal to distal bilateral lower extremities, + varicosities, pedal hair present bilateral.  Neurological: Gross sensation present via light touch bilateral.   Dermatological: Skin is warm, dry, and supple bilateral, Nails 1-10 are tender, long, thick, and discolored with mild subungal debris, no webspace macerations present bilateral, no open lesions present bilateral, no callus/corns/hyperkeratotic tissue present bilateral. No signs of infection bilateral.  Musculoskeletal: No boney deformities noted bilateral. Muscular strength within normal limits without pain or limitation on range of motion. No pain with calf compression bilateral.  Assessment and Plan:  Problem List Items Addressed This Visit    None    Visit Diagnoses    Dermatophytosis of nail    -  Primary    Pain of toe, unspecified laterality          -Examined patient.  -Discussed treatment options for painful mycotic nails. -Mechanically debrided and reduced mycotic nails with sterile nail nipper and dremel nail file without incident. -Patient to return in 3 months  for follow up evaluation or sooner if symptoms worsen.  Karen Cunningham, DPM

## 2016-02-21 ENCOUNTER — Ambulatory Visit: Payer: Medicare Other | Admitting: Sports Medicine

## 2016-02-21 ENCOUNTER — Other Ambulatory Visit: Payer: Self-pay | Admitting: Cardiovascular Disease

## 2016-02-21 ENCOUNTER — Encounter: Admission: RE | Payer: Self-pay | Source: Ambulatory Visit

## 2016-02-21 ENCOUNTER — Ambulatory Visit: Admission: RE | Admit: 2016-02-21 | Payer: Medicare Other | Source: Ambulatory Visit | Admitting: Cardiovascular Disease

## 2016-02-21 SURGERY — CARDIOVERSION (CATH LAB)
Anesthesia: General

## 2016-03-05 ENCOUNTER — Ambulatory Visit: Admit: 2016-03-05 | Payer: Self-pay | Admitting: Cardiovascular Disease

## 2016-03-05 SURGERY — CARDIOVERSION (CATH LAB)
Anesthesia: General

## 2016-05-15 ENCOUNTER — Ambulatory Visit (INDEPENDENT_AMBULATORY_CARE_PROVIDER_SITE_OTHER): Payer: Medicare Other | Admitting: Sports Medicine

## 2016-05-15 ENCOUNTER — Encounter: Payer: Self-pay | Admitting: Sports Medicine

## 2016-05-15 DIAGNOSIS — B351 Tinea unguium: Secondary | ICD-10-CM | POA: Diagnosis not present

## 2016-05-15 DIAGNOSIS — M79676 Pain in unspecified toe(s): Secondary | ICD-10-CM

## 2016-05-16 NOTE — Progress Notes (Signed)
Patient ID: Karen Cunningham Bieda, female   DOB: November 21, 1930, 82 y.o.   MRN: [id number]   Subjective: Karen Cunningham Boutelle is a 82 y.o. female patient seen today in office with complaint of painful thickened and elongated toenails; unable to trim. Patient denies any changes with medical history since last encouter. Patient has no other pedal complaints at this time.   Patient Active Problem List   Diagnosis Date Noted  . Degenerative arthritis of hip 06/09/2015  . H/O total knee replacement 06/09/2015  . H/O total hip arthroplasty 06/09/2015   Current Outpatient Prescriptions on File Prior to Visit  Medication Sig Dispense Refill  . amiodarone (PACERONE) 200 MG tablet Take 200 mg by mouth daily.    Marland Kitchen. amLODipine (NORVASC) 10 MG tablet Take 10 mg by mouth daily.    Marland Kitchen. aspirin EC 81 MG tablet Take 81 mg by mouth daily.    . Cholecalciferol (VITAMIN D3) 1000 UNITS CAPS Take by mouth daily.    . clopidogrel (PLAVIX) 75 MG tablet Take 75 mg by mouth daily with breakfast.    . Ferrous Sulfate (IRON) 325 (65 FE) MG TABS Take by mouth daily.    Marland Kitchen. levothyroxine (SYNTHROID, LEVOTHROID) 88 MCG tablet Take 88 mcg by mouth daily before breakfast.    . losartan-hydrochlorothiazide (HYZAAR) 100-12.5 MG per tablet Take 1 tablet by mouth daily.    . ranitidine (ZANTAC) 150 MG capsule Take 150 mg by mouth 2 (two) times daily.    . simvastatin (ZOCOR) 20 MG tablet Take 20 mg by mouth daily.     No current facility-administered medications on file prior to visit.    Allergies  Allergen Reactions  . Codeine Other (See Comments)    unknown  . Codeine Sulfate Nausea Only  . Ibuprofen Other (See Comments)    INSTRUCTED NOT TO TAKE BY KIDNEY DOCTOR.  . Prednisone Other (See Comments)    ELEVATED BLOOD PRESSURE. unknown     Objective: Physical Exam  General: Well developed, nourished, no acute distress, awake, alert and oriented x 3  Vascular: Dorsalis pedis artery 1/4 bilateral, Posterior tibial artery 1/4  bilateral, skin temperature warm to warm proximal to distal bilateral lower extremities, + varicosities, pedal hair present bilateral.  Neurological: Gross sensation present via light touch bilateral.   Dermatological: Skin is warm, dry, and supple bilateral, Nails 1-10 are tender, long, thick, and discolored with mild subungal debris, no webspace macerations present bilateral, no open lesions present bilateral, no callus/corns/hyperkeratotic tissue present bilateral. No signs of infection bilateral.  Musculoskeletal: No boney deformities noted bilateral. Muscular strength within normal limits without pain or limitation on range of motion. No pain with calf compression bilateral.  Assessment and Plan:  Problem List Items Addressed This Visit    None    Visit Diagnoses    Dermatophytosis of nail    -  Primary   Pain of toe, unspecified laterality         -Examined patient.  -Discussed treatment options for painful mycotic nails. -Mechanically debrided and reduced mycotic nails with sterile nail nipper and dremel nail file without incident. -Patient to return in 3 months  for follow up evaluation or sooner if symptoms worsen.  Asencion Islamitorya Dianey Suchy, DPM

## 2016-08-21 ENCOUNTER — Ambulatory Visit: Payer: Medicare Other | Admitting: Podiatry

## 2016-09-04 ENCOUNTER — Ambulatory Visit (INDEPENDENT_AMBULATORY_CARE_PROVIDER_SITE_OTHER): Payer: Medicare Other | Admitting: Podiatry

## 2016-09-04 ENCOUNTER — Encounter: Payer: Self-pay | Admitting: Podiatry

## 2016-09-04 DIAGNOSIS — M1990 Unspecified osteoarthritis, unspecified site: Secondary | ICD-10-CM

## 2016-09-04 DIAGNOSIS — M25571 Pain in right ankle and joints of right foot: Secondary | ICD-10-CM

## 2016-09-04 DIAGNOSIS — L603 Nail dystrophy: Secondary | ICD-10-CM

## 2016-09-04 DIAGNOSIS — M659 Synovitis and tenosynovitis, unspecified: Secondary | ICD-10-CM | POA: Diagnosis not present

## 2016-09-04 DIAGNOSIS — M19072 Primary osteoarthritis, left ankle and foot: Secondary | ICD-10-CM | POA: Diagnosis not present

## 2016-09-04 DIAGNOSIS — M7752 Other enthesopathy of left foot: Secondary | ICD-10-CM

## 2016-09-04 DIAGNOSIS — M79609 Pain in unspecified limb: Secondary | ICD-10-CM

## 2016-09-04 DIAGNOSIS — M19071 Primary osteoarthritis, right ankle and foot: Secondary | ICD-10-CM

## 2016-09-04 DIAGNOSIS — L608 Other nail disorders: Secondary | ICD-10-CM | POA: Diagnosis not present

## 2016-09-04 DIAGNOSIS — B351 Tinea unguium: Secondary | ICD-10-CM | POA: Diagnosis not present

## 2016-09-04 DIAGNOSIS — M7751 Other enthesopathy of right foot: Secondary | ICD-10-CM | POA: Diagnosis not present

## 2016-09-04 DIAGNOSIS — M25572 Pain in left ankle and joints of left foot: Secondary | ICD-10-CM

## 2016-09-04 MED ORDER — NONFORMULARY OR COMPOUNDED ITEM: 1.0000 g | Freq: Four times a day (QID) | 2 refills | Status: DC

## 2016-09-06 MED ORDER — BETAMETHASONE SOD PHOS & ACET 6 (3-3) MG/ML IJ SUSP: 3.0000 mg | Freq: Once | INTRAMUSCULAR | Status: DC

## 2016-09-06 NOTE — Progress Notes (Signed)
SUBJECTIVE Patient  presents to office today complaining of elongated, thickened nails. Pain while ambulating in shoes. Patient is unable to trim their own nails.  Patient also presents today for new complaint of bilateral ankle pain. Patient states that it hurts when she walks. She is unable to walk long distances without pain in her bilateral ankles. Patient denies a history of trauma. patient presents today for further treatment and evaluation  OBJECTIVE General Patient is awake, alert, and oriented x 3 and in no acute distress. Derm Skin is dry and supple bilateral. Negative open lesions or macerations. Remaining integument unremarkable. Nails are tender, long, thickened and dystrophic with subungual debris, consistent with onychomycosis, 1-5 bilateral. No signs of infection noted. Vasc  DP and PT pedal pulses palpable bilaterally. Temperature gradient within normal limits.  Neuro Epicritic and protective threshold sensation diminished bilaterally.  Musculoskeletal Exam pain on palpation to the anterior medial and lateral aspects of bilateral ankle joints. Likely consistent with a degenerative joint ankle arthritis. No symptomatic pedal deformities noted bilateral. Muscular strength within normal limits.  ASSESSMENT 1. Onychodystrophic nails 1-5 bilateral with hyperkeratosis of nails.  2. Onychomycosis of nail due to dermatophyte bilateral 3. Pain in foot bilateral 4. Bilateral ankle pain 5. 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 Patient evaluated today.  2. Instructed to maintain good pedal hygiene and foot care.  3. Mechanical debridement of nails 1-5 bilaterally performed using a nail nipper. Filed with dremel without incident.  4. Injection of 0.5 mL Celestone Soluspan injected in the patient's bilateral ankle joints  5. Prescription for anti-inflammatory pain cream  [facility]  6. Return to clinic in 3 mos.    Felecia ShellingBrent M Casimer Russett, DPM

## 2016-12-07 ENCOUNTER — Ambulatory Visit: Payer: Medicare Other | Admitting: Podiatry

## 2016-12-21 ENCOUNTER — Ambulatory Visit (INDEPENDENT_AMBULATORY_CARE_PROVIDER_SITE_OTHER): Payer: Medicare Other | Admitting: Podiatry

## 2016-12-21 ENCOUNTER — Encounter: Payer: Self-pay | Admitting: Podiatry

## 2016-12-21 DIAGNOSIS — B351 Tinea unguium: Secondary | ICD-10-CM | POA: Diagnosis not present

## 2016-12-21 DIAGNOSIS — M79609 Pain in unspecified limb: Secondary | ICD-10-CM

## 2016-12-21 NOTE — Progress Notes (Signed)
Complaint:  Visit Type: Patient returns to my office for continued preventative foot care services. Complaint: Patient states" my nails have grown long and thick and become painful to walk and wear shoes" . The patient presents for preventative foot care services. No changes to ROS  Podiatric Exam: Vascular: dorsalis pedis and posterior tibial pulses are palpable bilateral. Capillary return is immediate. Temperature gradient is WNL. Skin turgor WNL  Sensorium: Normal Semmes Weinstein monofilament test. Normal tactile sensation bilaterally. Nail Exam: Pt has thick disfigured discolored nails with subungual debris noted bilateral entire nail hallux through fifth toenails Ulcer Exam: There is no evidence of ulcer or pre-ulcerative changes or infection. Orthopedic Exam: Muscle tone and strength are WNL. No limitations in general ROM. No crepitus or effusions noted. Foot type and digits show no abnormalities. Bony prominences are unremarkable. Skin: No Porokeratosis. No infection or ulcers  Diagnosis:  Onychomycosis, , Pain in right toe, pain in left toes  Treatment & Plan Procedures and Treatment: Consent by patient was obtained for treatment procedures. The patient understood the discussion of treatment and procedures well. All questions were answered thoroughly reviewed. Debridement of mycotic and hypertrophic toenails, 1 through 5 bilateral and clearing of subungual debris. No ulceration, no infection noted.  Return Visit-Office Procedure: Patient instructed to return to the office for a follow up visit 3 months   for continued evaluation and treatment.    Giacomo Valone DPM 

## 2017-03-29 ENCOUNTER — Ambulatory Visit (INDEPENDENT_AMBULATORY_CARE_PROVIDER_SITE_OTHER): Payer: Medicare Other | Admitting: Podiatry

## 2017-03-29 DIAGNOSIS — M79609 Pain in unspecified limb: Secondary | ICD-10-CM | POA: Diagnosis not present

## 2017-03-29 DIAGNOSIS — B351 Tinea unguium: Secondary | ICD-10-CM

## 2017-03-29 NOTE — Progress Notes (Signed)
Complaint:  Visit Type: Patient returns to my office for continued preventative foot care services. Complaint: Patient states" my nails have grown long and thick and become painful to walk and wear shoes" . The patient presents for preventative foot care services. No changes to ROS  Podiatric Exam: Vascular: dorsalis pedis and posterior tibial pulses are palpable bilateral. Capillary return is immediate. Temperature gradient is WNL. Skin turgor WNL  Sensorium: Normal Semmes Weinstein monofilament test. Normal tactile sensation bilaterally. Nail Exam: Pt has thick disfigured discolored nails with subungual debris noted bilateral entire nail hallux through fifth toenails Ulcer Exam: There is no evidence of ulcer or pre-ulcerative changes or infection. Orthopedic Exam: Muscle tone and strength are WNL. No limitations in general ROM. No crepitus or effusions noted. Foot type and digits show no abnormalities. Bony prominences are unremarkable. Skin: No Porokeratosis. No infection or ulcers  Diagnosis:  Onychomycosis, , Pain in right toe, pain in left toes  Treatment & Plan Procedures and Treatment: Consent by patient was obtained for treatment procedures. The patient understood the discussion of treatment and procedures well. All questions were answered thoroughly reviewed. Debridement of mycotic and hypertrophic toenails, 1 through 5 bilateral and clearing of subungual debris. No ulceration, no infection noted.  Return Visit-Office Procedure: Patient instructed to return to the office for a follow up visit 3 months   for continued evaluation and treatment.    Ytzel Gubler DPM 

## 2017-03-30 ENCOUNTER — Encounter
Admission: RE | Admit: 2017-03-30 | Discharge: 2017-03-30 | Disposition: A | Discharge status: Home / Self Care | Payer: Medicare Other | Source: Ambulatory Visit | Attending: Orthopedic Surgery | Admitting: Orthopedic Surgery

## 2017-03-30 DIAGNOSIS — M1611 Unilateral primary osteoarthritis, right hip: Secondary | ICD-10-CM | POA: Insufficient documentation

## 2017-03-30 DIAGNOSIS — Z01812 Encounter for preprocedural laboratory examination: Secondary | ICD-10-CM | POA: Insufficient documentation

## 2017-03-30 DIAGNOSIS — Z0183 Encounter for blood typing: Secondary | ICD-10-CM | POA: Insufficient documentation

## 2017-03-30 HISTORY — DX: Hyperlipidemia, unspecified: E78.5

## 2017-03-30 HISTORY — DX: Chronic kidney disease, unspecified: N18.9

## 2017-03-30 HISTORY — DX: Unspecified atrial fibrillation: I48.91

## 2017-03-30 HISTORY — DX: Gastro-esophageal reflux disease without esophagitis: K21.9

## 2017-03-30 HISTORY — DX: Atherosclerotic heart disease of native coronary artery without angina pectoris: I25.10

## 2017-03-30 HISTORY — DX: Anemia, unspecified: D64.9

## 2017-03-30 HISTORY — DX: Essential (primary) hypertension: I10

## 2017-03-30 HISTORY — DX: Hypothyroidism, unspecified: E03.9

## 2017-03-30 HISTORY — DX: Unspecified osteoarthritis, unspecified site: M19.90

## 2017-03-30 LAB — COMPREHENSIVE METABOLIC PANEL
ALBUMIN: 4.1 g/dL (ref 3.5–5.0)
ALK PHOS: 75 U/L (ref 38–126)
ALT: 14 U/L (ref 14–54)
ANION GAP: 10 (ref 5–15)
AST: 21 U/L (ref 15–41)
BUN: 21 mg/dL — ABNORMAL HIGH (ref 6–20)
CALCIUM: 9.8 mg/dL (ref 8.9–10.3)
CO2: 26 mmol/L (ref 22–32)
Chloride: 106 mmol/L (ref 101–111)
Creatinine, Ser: 1.1 mg/dL — ABNORMAL HIGH (ref 0.44–1.00)
GFR calc non Af Amer: 45 mL/min — ABNORMAL LOW (ref >60)
GFR, EST AFRICAN AMERICAN: 52 mL/min — AB (ref >60)
Glucose, Bld: 104 mg/dL — ABNORMAL HIGH (ref 65–99)
POTASSIUM: 4.3 mmol/L (ref 3.5–5.1)
SODIUM: 142 mmol/L (ref 135–145)
Total Bilirubin: 1.3 mg/dL — ABNORMAL HIGH (ref 0.3–1.2)
Total Protein: 7.4 g/dL (ref 6.5–8.1)

## 2017-03-30 LAB — CBC
HCT: 39.4 % (ref 35.0–47.0)
Hemoglobin: 13.2 g/dL (ref 12.0–16.0)
MCH: 29.9 pg (ref 26.0–34.0)
MCHC: 33.5 g/dL (ref 32.0–36.0)
MCV: 89.3 fL (ref 80.0–100.0)
PLATELETS: 237 10*3/uL (ref 150–440)
RBC: 4.42 MIL/uL (ref 3.80–5.20)
RDW: 14.4 % (ref 11.5–14.5)
WBC: 9.2 10*3/uL (ref 3.6–11.0)

## 2017-03-30 LAB — TYPE AND SCREEN
ABO/RH(D): O NEG
ANTIBODY SCREEN: NEGATIVE

## 2017-03-30 LAB — SURGICAL PCR SCREEN
MRSA, PCR: NEGATIVE
STAPHYLOCOCCUS AUREUS: NEGATIVE

## 2017-03-30 LAB — C-REACTIVE PROTEIN

## 2017-03-30 LAB — URINALYSIS, ROUTINE W REFLEX MICROSCOPIC
Bacteria, UA: NONE SEEN
Bilirubin Urine: NEGATIVE
Glucose, UA: NEGATIVE mg/dL
HGB URINE DIPSTICK: NEGATIVE
Ketones, ur: NEGATIVE mg/dL
NITRITE: NEGATIVE
PROTEIN: NEGATIVE mg/dL
Specific Gravity, Urine: 1.011 (ref 1.005–1.030)
pH: 6 (ref 5.0–8.0)

## 2017-03-30 LAB — APTT: aPTT: 37 seconds — ABNORMAL HIGH (ref 24–36)

## 2017-03-30 LAB — PROTIME-INR
INR: 2.69
Prothrombin Time: 29.1 seconds — ABNORMAL HIGH (ref 11.4–15.2)

## 2017-03-30 LAB — SEDIMENTATION RATE: SED RATE: 18 mm/h (ref 0–30)

## 2017-03-30 NOTE — Patient Instructions (Signed)
Your procedure is scheduled on: Wednesday 04/14/17 Report to DAY SURGERY. 2ND FLOOR MEDICAL MALL ENTRANCE. To find out your arrival time please call [phone] between 1PM - 3PM on Tuesday 04/13/17.  Remember: Instructions that are not followed completely may result in serious medical risk, up to and including death, or upon the discretion of your surgeon and anesthesiologist your surgery may need to be rescheduled.    __X__ 1. Do not eat food or drink liquids after midnight. No gum chewing or hard candies.     __X__ 2. No Alcohol for 24 hours before or after surgery.   ____ 3. Bring all medications with you on the day of surgery if instructed.    __X__ 4. Notify your doctor if there is any change in your medical condition     (cold, fever, infections).             ___X__5. No smoking within 24 hours of your surgery.     Do not wear jewelry, make-up, hairpins, clips or nail polish.  Do not wear lotions, powders, or perfumes.   Do not shave 48 hours prior to surgery. Men may shave face and neck.  Do not bring valuables to the hospital.    [facility] is not responsible for any belongings or valuables.               Contacts, dentures or bridgework may not be worn into surgery.  Leave your suitcase in the car. After surgery it may be brought to your room.  For patients admitted to the hospital, discharge time is determined by your                treatment team.   Patients discharged the day of surgery will not be allowed to drive home.   Please read over the following fact sheets that you were given:   MRSA Information   __X__ Take these medicines the morning of surgery with A SIP OF WATER:    1. TAKE YOUR DIGOXIN IF IT IS THE DAY TO DO SO  2. LEVTHYROXINE  3. METOPROLOL  4. RANITIDAINE  5.  6.  ____ Fleet Enema (as directed)   __X__ Use CHG Soap as directed  ____ Use inhalers on the day of surgery  ____ Stop metformin 2 days prior to surgery    ____ Take 1/2 of usual  insulin dose the night before surgery and none on the morning of surgery.   __X__ CONTACT YOUR CARDIOLOGIST IN REGARDS TO YOUR ABILITY TO STOP YOUR WARFARIN/COUMADIN AND/OR YOUR ASPIRIN FOR THIS SURGERY  __X__ Stop Anti-inflammatories such as Advil, Aleve, Ibuprofen, Motrin, Naproxen, Naprosyn, Goodies,powder, or aspirin products.  OK to take Tylenol.   __X__ Stop supplements until after surgery.  STOP YOUR MELATONIN 7 DAYS BEFORE SURGERY  ____ Bring C-Pap to the hospital.

## 2017-03-31 ENCOUNTER — Encounter: Payer: Self-pay | Admitting: *Deleted

## 2017-03-31 LAB — URINE CULTURE: SPECIAL REQUESTS: NORMAL

## 2017-04-14 ENCOUNTER — Inpatient Hospital Stay: Admission: RE | Admit: 2017-04-14 | Payer: Medicare Other | Source: Ambulatory Visit | Admitting: Orthopedic Surgery

## 2017-04-14 ENCOUNTER — Encounter: Admission: RE | Payer: Self-pay | Source: Ambulatory Visit

## 2017-04-14 SURGERY — ARTHROPLASTY, HIP, TOTAL,POSTERIOR APPROACH
Anesthesia: Choice | Laterality: Right

## 2017-07-12 ENCOUNTER — Ambulatory Visit: Payer: Medicare Other | Admitting: Podiatry

## 2017-07-13 ENCOUNTER — Encounter: Payer: Self-pay | Admitting: Podiatry

## 2017-07-13 ENCOUNTER — Ambulatory Visit (INDEPENDENT_AMBULATORY_CARE_PROVIDER_SITE_OTHER): Payer: Medicare Other | Admitting: Podiatry

## 2017-07-13 DIAGNOSIS — M79609 Pain in unspecified limb: Secondary | ICD-10-CM | POA: Diagnosis not present

## 2017-07-13 DIAGNOSIS — B351 Tinea unguium: Secondary | ICD-10-CM

## 2017-07-15 NOTE — Progress Notes (Signed)
   SUBJECTIVE Patient presents to office today complaining of elongated, thickened nails. Pain while ambulating in shoes. Patient is unable to trim their own nails.   Past Medical History:  Diagnosis Date  . Anemia   . Arthritis   . Atrial fibrillation (HCC)   . Chronic kidney disease   . Coronary artery disease   . GERD (gastroesophageal reflux disease)   . HLD (hyperlipidemia)   . Hypertension   . Hypothyroidism     OBJECTIVE General Patient is awake, alert, and oriented x 3 and in no acute distress. Derm Skin is dry and supple bilateral. Negative open lesions or macerations. Remaining integument unremarkable. Nails are tender, long, thickened and dystrophic with subungual debris, consistent with onychomycosis, 1-5 bilateral. No signs of infection noted. Vasc  DP and PT pedal pulses palpable bilaterally. Temperature gradient within normal limits.  Neuro Epicritic and protective threshold sensation diminished bilaterally.  Musculoskeletal Exam No symptomatic pedal deformities noted bilateral. Muscular strength within normal limits.  ASSESSMENT 1. Onychodystrophic nails 1-5 bilateral with hyperkeratosis of nails.  2. Onychomycosis of nail due to dermatophyte bilateral 3. Pain in foot bilateral  PLAN OF CARE 1. Patient evaluated today.  2. Instructed to maintain good pedal hygiene and foot care.  3. Mechanical debridement of nails 1-5 bilaterally performed using a nail nipper. Filed with dremel without incident.  4. Return to clinic in 3 mos.    Felecia ShellingBrent M. Evans, DPM [facility]  Dr. Felecia ShellingBrent M. Evans, DPM    [address]                                        [place], [place] [address]                Office [phone]  Fax [phone]

## 2017-10-15 ENCOUNTER — Ambulatory Visit (INDEPENDENT_AMBULATORY_CARE_PROVIDER_SITE_OTHER): Payer: Medicare Other | Admitting: Podiatry

## 2017-10-15 ENCOUNTER — Encounter: Payer: Self-pay | Admitting: Podiatry

## 2017-10-15 DIAGNOSIS — M79609 Pain in unspecified limb: Secondary | ICD-10-CM

## 2017-10-15 DIAGNOSIS — B351 Tinea unguium: Secondary | ICD-10-CM

## 2017-10-17 NOTE — Progress Notes (Signed)
   SUBJECTIVE Patient presents to office today complaining of elongated, thickened nails. Pain while ambulating in shoes. Patient is unable to trim their own nails.   Past Medical History:  Diagnosis Date  . Anemia   . Arthritis   . Atrial fibrillation (HCC)   . Chronic kidney disease   . Coronary artery disease   . GERD (gastroesophageal reflux disease)   . HLD (hyperlipidemia)   . Hypertension   . Hypothyroidism     OBJECTIVE General Patient is awake, alert, and oriented x 3 and in no acute distress. Derm Skin is dry and supple bilateral. Negative open lesions or macerations. Remaining integument unremarkable. Nails are tender, long, thickened and dystrophic with subungual debris, consistent with onychomycosis, 1-5 bilateral. No signs of infection noted. Vasc  DP and PT pedal pulses palpable bilaterally. Temperature gradient within normal limits.  Neuro Epicritic and protective threshold sensation diminished bilaterally.  Musculoskeletal Exam No symptomatic pedal deformities noted bilateral. Muscular strength within normal limits.  ASSESSMENT 1. Onychodystrophic nails 1-5 bilateral with hyperkeratosis of nails.  2. Onychomycosis of nail due to dermatophyte bilateral 3. Pain in foot bilateral  PLAN OF CARE 1. Patient evaluated today.  2. Instructed to maintain good pedal hygiene and foot care.  3. Mechanical debridement of nails 1-5 bilaterally performed using a nail nipper. Filed with dremel without incident.  4. Return to clinic in 3 mos.    Carely Nappier M. Sherleen Pangborn, DPM [facility]  Dr. Alicianna Litchford M. Reyaan Thoma, DPM    [address]                                        [place], [place] [address]                Office [phone]  Fax [phone]     

## 2018-01-14 ENCOUNTER — Ambulatory Visit (INDEPENDENT_AMBULATORY_CARE_PROVIDER_SITE_OTHER): Payer: Medicare Other | Admitting: Podiatry

## 2018-01-14 ENCOUNTER — Encounter: Payer: Self-pay | Admitting: Podiatry

## 2018-01-14 DIAGNOSIS — M79609 Pain in unspecified limb: Secondary | ICD-10-CM | POA: Diagnosis not present

## 2018-01-14 DIAGNOSIS — B351 Tinea unguium: Secondary | ICD-10-CM

## 2018-01-17 NOTE — Progress Notes (Signed)
   SUBJECTIVE Patient presents to office today complaining of elongated, thickened nails that cause pain while ambulating in shoes. She is unable to trim her own nails. Patient is here for further evaluation and treatment.  Past Medical History:  Diagnosis Date  . Anemia   . Arthritis   . Atrial fibrillation (HCC)   . Chronic kidney disease   . Coronary artery disease   . GERD (gastroesophageal reflux disease)   . HLD (hyperlipidemia)   . Hypertension   . Hypothyroidism     OBJECTIVE General Patient is awake, alert, and oriented x 3 and in no acute distress. Derm Skin is dry and supple bilateral. Negative open lesions or macerations. Remaining integument unremarkable. Nails are tender, long, thickened and dystrophic with subungual debris, consistent with onychomycosis, 1-5 bilateral. No signs of infection noted. Vasc  DP and PT pedal pulses palpable bilaterally. Temperature gradient within normal limits.  Neuro Epicritic and protective threshold sensation grossly intact bilaterally.  Musculoskeletal Exam No symptomatic pedal deformities noted bilateral. Muscular strength within normal limits.  ASSESSMENT 1. Onychodystrophic nails 1-5 bilateral with hyperkeratosis of nails.  2. Onychomycosis of nail due to dermatophyte bilateral 3. Pain in foot bilateral  PLAN OF CARE 1. Patient evaluated today.  2. Instructed to maintain good pedal hygiene and foot care.  3. Mechanical debridement of nails 1-5 bilaterally performed using a nail nipper. Filed with dremel without incident.  4. Return to clinic in 3 mos.   Has right hip replacement on June 19th.   Felecia Shelling, DPM [facility]  Dr. Felecia Shelling, DPM    [address]                                        [place], [place] [address]                Office [phone]  Fax [phone]

## 2018-02-25 ENCOUNTER — Other Ambulatory Visit: Payer: Self-pay

## 2018-02-25 ENCOUNTER — Encounter
Admission: RE | Admit: 2018-02-25 | Discharge: 2018-02-25 | Disposition: A | Discharge status: Home / Self Care | Payer: Medicare Other | Source: Ambulatory Visit | Attending: Orthopedic Surgery | Admitting: Orthopedic Surgery

## 2018-02-25 DIAGNOSIS — Z01810 Encounter for preprocedural cardiovascular examination: Secondary | ICD-10-CM | POA: Insufficient documentation

## 2018-02-25 DIAGNOSIS — I4891 Unspecified atrial fibrillation: Secondary | ICD-10-CM | POA: Diagnosis not present

## 2018-02-25 DIAGNOSIS — Z01812 Encounter for preprocedural laboratory examination: Secondary | ICD-10-CM | POA: Insufficient documentation

## 2018-02-25 LAB — URINALYSIS, ROUTINE W REFLEX MICROSCOPIC
Bilirubin Urine: NEGATIVE
Glucose, UA: 50 mg/dL — AB
HGB URINE DIPSTICK: NEGATIVE
KETONES UR: NEGATIVE mg/dL
Leukocytes, UA: NEGATIVE
Nitrite: NEGATIVE
PROTEIN: NEGATIVE mg/dL
Specific Gravity, Urine: 1.009 (ref 1.005–1.030)
pH: 6 (ref 5.0–8.0)

## 2018-02-25 LAB — CBC
HCT: 41.1 % (ref 35.0–47.0)
HEMOGLOBIN: 13.6 g/dL (ref 12.0–16.0)
MCH: 29.6 pg (ref 26.0–34.0)
MCHC: 33.1 g/dL (ref 32.0–36.0)
MCV: 89.3 fL (ref 80.0–100.0)
Platelets: 259 10*3/uL (ref 150–440)
RBC: 4.6 MIL/uL (ref 3.80–5.20)
RDW: 14.3 % (ref 11.5–14.5)
WBC: 11 10*3/uL (ref 3.6–11.0)

## 2018-02-25 LAB — PROTIME-INR
INR: 2.25
Prothrombin Time: 24.7 seconds — ABNORMAL HIGH (ref 11.4–15.2)

## 2018-02-25 LAB — SURGICAL PCR SCREEN
MRSA, PCR: NEGATIVE
STAPHYLOCOCCUS AUREUS: NEGATIVE

## 2018-02-25 LAB — COMPREHENSIVE METABOLIC PANEL
ALBUMIN: 4.6 g/dL (ref 3.5–5.0)
ALT: 15 U/L (ref 14–54)
AST: 25 U/L (ref 15–41)
Alkaline Phosphatase: 87 U/L (ref 38–126)
Anion gap: 11 (ref 5–15)
BUN: 20 mg/dL (ref 6–20)
CO2: 26 mmol/L (ref 22–32)
CREATININE: 0.98 mg/dL (ref 0.44–1.00)
Calcium: 9.3 mg/dL (ref 8.9–10.3)
Chloride: 101 mmol/L (ref 101–111)
GFR calc Af Amer: 59 mL/min — ABNORMAL LOW (ref >60)
GFR calc non Af Amer: 51 mL/min — ABNORMAL LOW (ref >60)
GLUCOSE: 103 mg/dL — AB (ref 65–99)
Potassium: 3.1 mmol/L — ABNORMAL LOW (ref 3.5–5.1)
SODIUM: 138 mmol/L (ref 135–145)
Total Bilirubin: 1.5 mg/dL — ABNORMAL HIGH (ref 0.3–1.2)
Total Protein: 7.9 g/dL (ref 6.5–8.1)

## 2018-02-25 LAB — TYPE AND SCREEN
ABO/RH(D): O NEG
Antibody Screen: NEGATIVE

## 2018-02-25 LAB — SEDIMENTATION RATE: Sed Rate: 16 mm/hr (ref 0–30)

## 2018-02-25 LAB — APTT: APTT: 36 s (ref 24–36)

## 2018-02-25 LAB — C-REACTIVE PROTEIN: CRP: <0.8 mg/dL (ref <1.0)

## 2018-02-25 NOTE — Patient Instructions (Signed)
[facility]: [facility] 2nd [facility] of [facility]. To find out your arrival time, please call [phone] between 1PM - 3PM on: Tuesday March 08, 2018  REMEMBER: Instructions that are not followed completely may result in serious medical risk, up to and including death; or upon the discretion of your surgeon and anesthesiologist your surgery may need to be rescheduled.  Do not eat food after midnight the night before your procedure.  No gum chewing, lozengers or hard candies.  You may however, drink CLEAR liquids up to 2 hours before you are scheduled to arrive for your surgery. Do not drink anything within 2 hours of the start of your surgery.  Clear liquids include: - water  - apple juice without pulp - clear gatorade - black coffee or tea (Do NOT add anything to the coffee or tea) Do NOT drink anything that is not on this list.    No Alcohol for 24 hours before or after surgery.  No Smoking including e-cigarettes for 24 hours prior to surgery.  No chewable tobacco products for at least 6 hours prior to surgery.  No nicotine patches on the day of surgery.  On the morning of surgery brush your teeth with toothpaste and water, you may rinse your mouth with mouthwash if you wish. Do not swallow any toothpaste or mouthwash.  Notify your doctor if there is any change in your medical condition (cold, fever, infection).  Do not wear jewelry, make-up, hairpins, clips or nail polish.  Do not wear lotions, powders, or perfumes. You may wear deodorant.  Do not shave 48 hours prior to surgery. Men may shave face and neck.  Contacts and dentures may not be worn into surgery.  Do not bring valuables to the hospital, including drivers license, insurance or credit cards.  [facility] is not responsible for any belongings or valuables.   TAKE THESE MEDICATIONS THE MORNING OF SURGERY: AMLODIPINE IF EVERY OTHER TAKE  DIGOXIN LEVOTHYROXINE METOPROLOL  Use CHG Soap as directed on instruction sheet.  Follow recommendations from Cardiologist, Pulmonologist or PCP regarding stopping Aspirin LAST DOSE March 01, 2018 Coumadin LAST DOSE March 03, 2018 PER PREVIOUS INSTRUCTION FOR SURGERY THAT WAS CANCELLED  Stop Anti-inflammatories (NSAIDS) such as Advil, Aleve, Ibuprofen, Motrin, Naproxen, Naprosyn and Aspirin based products such as Excedrin, Goodys Powder, BC Powder. (May take Tylenol or Acetaminophen if needed.)  Stop ANY OVER THE COUNTER supplements until after surgery MELATONIN (May continue Vitamin D, Vitamin B, IRON and multivitamin.)  Wear comfortable clothing (specific to your surgery type) to the hospital.  Plan for stool softeners for home use.  If you are being admitted to the hospital overnight, leave your suitcase in the car. After surgery it may be brought to your room.  If you are being discharged the day of surgery, you will not be allowed to drive home. You will need a responsible adult to drive you home and stay with you that night.   If you are taking public transportation, you will need to have a responsible adult with you. Please confirm with your physician that it is acceptable to use public transportation.   Please call [phone] if you have any questions about these instructions.

## 2018-02-26 LAB — URINE CULTURE
Culture: <10000 — AB
Special Requests: NORMAL

## 2018-03-01 NOTE — Pre-Procedure Instructions (Signed)
REQUEST FOR CARDIAC CLEARANCE/ EKG CALLED AND FAXED TO DR SADAT Welton FlakesKHAN. ALSO FAXED TO DR Ernest PineHOOTEN

## 2018-03-08 MED ORDER — TRANEXAMIC ACID 1000 MG/10ML IV SOLN
1000.0000 mg | INTRAVENOUS | Status: DC
  Filled 2018-03-08: qty 10

## 2018-03-08 MED ORDER — CEFAZOLIN SODIUM-DEXTROSE 2-4 GM/100ML-% IV SOLN: 2.0000 g | INTRAVENOUS | Status: DC

## 2018-03-08 NOTE — Pre-Procedure Instructions (Signed)
CLEARANCE FROM DR Milta DeitersS KHAN ON CHART. RECHECK KT 4.6 ON 03/04/18

## 2018-03-09 ENCOUNTER — Inpatient Hospital Stay: Payer: Medicare Other

## 2018-03-09 ENCOUNTER — Encounter
Admission: RE | Disposition: A | Discharge status: Home Health | Payer: Self-pay | Source: Home / Self Care | Attending: Orthopedic Surgery

## 2018-03-09 ENCOUNTER — Inpatient Hospital Stay: Payer: Medicare Other | Admitting: Anesthesiology

## 2018-03-09 ENCOUNTER — Encounter: Payer: Self-pay | Admitting: Orthopedic Surgery

## 2018-03-09 ENCOUNTER — Other Ambulatory Visit: Payer: Self-pay

## 2018-03-09 ENCOUNTER — Inpatient Hospital Stay
Admission: RE | Admit: 2018-03-09 | Discharge: 2018-03-11 | DRG: 470 | Disposition: A | Discharge status: Home Health | Payer: Medicare Other | Attending: Orthopedic Surgery | Admitting: Orthopedic Surgery

## 2018-03-09 DIAGNOSIS — M1611 Unilateral primary osteoarthritis, right hip: Secondary | ICD-10-CM | POA: Diagnosis present

## 2018-03-09 DIAGNOSIS — Z8249 Family history of ischemic heart disease and other diseases of the circulatory system: Secondary | ICD-10-CM

## 2018-03-09 DIAGNOSIS — Z96649 Presence of unspecified artificial hip joint: Secondary | ICD-10-CM

## 2018-03-09 DIAGNOSIS — K219 Gastro-esophageal reflux disease without esophagitis: Secondary | ICD-10-CM | POA: Diagnosis present

## 2018-03-09 DIAGNOSIS — Z7901 Long term (current) use of anticoagulants: Secondary | ICD-10-CM | POA: Diagnosis not present

## 2018-03-09 DIAGNOSIS — Z7982 Long term (current) use of aspirin: Secondary | ICD-10-CM | POA: Diagnosis not present

## 2018-03-09 DIAGNOSIS — E039 Hypothyroidism, unspecified: Secondary | ICD-10-CM | POA: Diagnosis present

## 2018-03-09 DIAGNOSIS — Z823 Family history of stroke: Secondary | ICD-10-CM

## 2018-03-09 DIAGNOSIS — Z885 Allergy status to narcotic agent status: Secondary | ICD-10-CM

## 2018-03-09 DIAGNOSIS — Z96642 Presence of left artificial hip joint: Secondary | ICD-10-CM | POA: Diagnosis present

## 2018-03-09 DIAGNOSIS — D649 Anemia, unspecified: Secondary | ICD-10-CM | POA: Diagnosis present

## 2018-03-09 DIAGNOSIS — Z888 Allergy status to other drugs, medicaments and biological substances status: Secondary | ICD-10-CM | POA: Diagnosis not present

## 2018-03-09 DIAGNOSIS — E785 Hyperlipidemia, unspecified: Secondary | ICD-10-CM | POA: Diagnosis present

## 2018-03-09 DIAGNOSIS — Z96653 Presence of artificial knee joint, bilateral: Secondary | ICD-10-CM | POA: Diagnosis present

## 2018-03-09 DIAGNOSIS — I129 Hypertensive chronic kidney disease with stage 1 through stage 4 chronic kidney disease, or unspecified chronic kidney disease: Secondary | ICD-10-CM | POA: Diagnosis present

## 2018-03-09 DIAGNOSIS — Z79890 Hormone replacement therapy (postmenopausal): Secondary | ICD-10-CM | POA: Diagnosis not present

## 2018-03-09 DIAGNOSIS — Z79899 Other long term (current) drug therapy: Secondary | ICD-10-CM

## 2018-03-09 DIAGNOSIS — N189 Chronic kidney disease, unspecified: Secondary | ICD-10-CM | POA: Diagnosis present

## 2018-03-09 DIAGNOSIS — I4891 Unspecified atrial fibrillation: Secondary | ICD-10-CM | POA: Diagnosis present

## 2018-03-09 DIAGNOSIS — I2510 Atherosclerotic heart disease of native coronary artery without angina pectoris: Secondary | ICD-10-CM | POA: Diagnosis present

## 2018-03-09 DIAGNOSIS — Z90710 Acquired absence of both cervix and uterus: Secondary | ICD-10-CM

## 2018-03-09 HISTORY — PX: TOTAL HIP ARTHROPLASTY: SHX124

## 2018-03-09 LAB — POCT I-STAT 4, (NA,K, GLUC, HGB,HCT)
Glucose, Bld: 124 mg/dL — ABNORMAL HIGH (ref 65–99)
HEMATOCRIT: 37 % (ref 36.0–46.0)
Hemoglobin: 12.6 g/dL (ref 12.0–15.0)
POTASSIUM: 3.4 mmol/L — AB (ref 3.5–5.1)
SODIUM: 140 mmol/L (ref 135–145)

## 2018-03-09 LAB — APTT: aPTT: 27 seconds (ref 24–36)

## 2018-03-09 LAB — PROTIME-INR
INR: 1.05
PROTHROMBIN TIME: 13.6 s (ref 11.4–15.2)

## 2018-03-09 SURGERY — ARTHROPLASTY, HIP, TOTAL,POSTERIOR APPROACH
Anesthesia: Spinal | Laterality: Right

## 2018-03-09 MED ORDER — METOCLOPRAMIDE HCL 5 MG/ML IJ SOLN: 5.0000 mg | Freq: Three times a day (TID) | INTRAMUSCULAR | Status: DC | PRN

## 2018-03-09 MED ORDER — TRAMADOL HCL 50 MG PO TABS
50.0000 mg | ORAL_TABLET | ORAL | Status: DC | PRN
  Administered 2018-03-09 – 2018-03-11 (×6): 50 mg via ORAL
  Filled 2018-03-09 (×6): qty 1

## 2018-03-09 MED ORDER — FENTANYL CITRATE (PF) 100 MCG/2ML IJ SOLN: 25.0000 ug | INTRAMUSCULAR | Status: DC | PRN

## 2018-03-09 MED ORDER — ALUM & MAG HYDROXIDE-SIMETH 200-200-20 MG/5ML PO SUSP: 30.0000 mL | ORAL | Status: DC | PRN

## 2018-03-09 MED ORDER — LEVOTHYROXINE SODIUM 88 MCG PO TABS
88.0000 ug | ORAL_TABLET | Freq: Every day | ORAL | Status: DC
  Administered 2018-03-10 – 2018-03-11 (×2): 88 ug via ORAL
  Filled 2018-03-09 (×2): qty 1

## 2018-03-09 MED ORDER — SENNOSIDES-DOCUSATE SODIUM 8.6-50 MG PO TABS
1.0000 | ORAL_TABLET | Freq: Two times a day (BID) | ORAL | Status: DC
  Administered 2018-03-10 – 2018-03-11 (×3): 1 via ORAL
  Filled 2018-03-09 (×3): qty 1

## 2018-03-09 MED ORDER — FLEET ENEMA 7-19 GM/118ML RE ENEM: 1.0000 | ENEMA | Freq: Once | RECTAL | Status: DC | PRN

## 2018-03-09 MED ORDER — DIGOXIN 125 MCG PO TABS
0.1250 mg | ORAL_TABLET | ORAL | Status: DC
  Administered 2018-03-11: 0.125 mg via ORAL
  Filled 2018-03-09: qty 1

## 2018-03-09 MED ORDER — WARFARIN SODIUM 2 MG PO TABS
2.0000 mg | ORAL_TABLET | Freq: Every day | ORAL | Status: DC
  Administered 2018-03-10: 2 mg via ORAL
  Filled 2018-03-09 (×2): qty 1

## 2018-03-09 MED ORDER — PROPOFOL 500 MG/50ML IV EMUL
INTRAVENOUS | Status: DC | PRN
  Administered 2018-03-09: 60 ug/kg/min via INTRAVENOUS

## 2018-03-09 MED ORDER — ACETAMINOPHEN 10 MG/ML IV SOLN
1000.0000 mg | Freq: Four times a day (QID) | INTRAVENOUS | Status: AC
  Administered 2018-03-09 – 2018-03-10 (×3): 1000 mg via INTRAVENOUS
  Filled 2018-03-09 (×4): qty 100

## 2018-03-09 MED ORDER — FERROUS SULFATE 325 (65 FE) MG PO TABS
325.0000 mg | ORAL_TABLET | Freq: Every day | ORAL | Status: DC
  Administered 2018-03-10 – 2018-03-11 (×2): 325 mg via ORAL
  Filled 2018-03-09 (×2): qty 1

## 2018-03-09 MED ORDER — ACETAMINOPHEN 10 MG/ML IV SOLN
INTRAVENOUS | Status: AC
  Filled 2018-03-09: qty 100

## 2018-03-09 MED ORDER — DEXAMETHASONE SODIUM PHOSPHATE 10 MG/ML IJ SOLN
INTRAMUSCULAR | Status: AC
  Administered 2018-03-09: 8 mg via INTRAVENOUS
  Filled 2018-03-09: qty 1

## 2018-03-09 MED ORDER — VASOPRESSIN 20 UNIT/ML IV SOLN
INTRAVENOUS | Status: DC | PRN
  Administered 2018-03-09: 2 [IU] via INTRAVENOUS

## 2018-03-09 MED ORDER — FAMOTIDINE 20 MG PO TABS
ORAL_TABLET | ORAL | Status: AC
  Administered 2018-03-09: 20 mg via ORAL
  Filled 2018-03-09: qty 1

## 2018-03-09 MED ORDER — BISACODYL 10 MG RE SUPP
10.0000 mg | Freq: Every day | RECTAL | Status: DC | PRN
  Administered 2018-03-11: 10 mg via RECTAL
  Filled 2018-03-09: qty 1

## 2018-03-09 MED ORDER — METOCLOPRAMIDE HCL 10 MG PO TABS: 5.0000 mg | ORAL_TABLET | Freq: Three times a day (TID) | ORAL | Status: DC | PRN

## 2018-03-09 MED ORDER — FENTANYL CITRATE (PF) 100 MCG/2ML IJ SOLN
INTRAMUSCULAR | Status: AC
  Filled 2018-03-09: qty 2

## 2018-03-09 MED ORDER — FAMOTIDINE 20 MG PO TABS
20.0000 mg | ORAL_TABLET | Freq: Once | ORAL | Status: AC
  Administered 2018-03-09: 20 mg via ORAL

## 2018-03-09 MED ORDER — EPHEDRINE SULFATE 50 MG/ML IJ SOLN
INTRAMUSCULAR | Status: DC | PRN
  Administered 2018-03-09 (×3): 5 mg via INTRAVENOUS
  Administered 2018-03-09: 10 mg via INTRAVENOUS
  Administered 2018-03-09 (×4): 5 mg via INTRAVENOUS

## 2018-03-09 MED ORDER — ONDANSETRON HCL 4 MG/2ML IJ SOLN: 4.0000 mg | Freq: Four times a day (QID) | INTRAMUSCULAR | Status: DC | PRN

## 2018-03-09 MED ORDER — LOSARTAN POTASSIUM 50 MG PO TABS
50.0000 mg | ORAL_TABLET | Freq: Every day | ORAL | Status: DC
Start: 2018-03-09 — End: 2018-03-11
  Administered 2018-03-10 – 2018-03-11 (×2): 50 mg via ORAL
  Filled 2018-03-09 (×2): qty 1

## 2018-03-09 MED ORDER — PHENOL 1.4 % MT LIQD
1.0000 | OROMUCOSAL | Status: DC | PRN
  Filled 2018-03-09: qty 177

## 2018-03-09 MED ORDER — OXYCODONE HCL 5 MG PO TABS: 5.0000 mg | ORAL_TABLET | ORAL | Status: DC | PRN

## 2018-03-09 MED ORDER — PROPOFOL 500 MG/50ML IV EMUL
INTRAVENOUS | Status: AC
  Filled 2018-03-09: qty 50

## 2018-03-09 MED ORDER — OXYCODONE HCL 5 MG PO TABS: 10.0000 mg | ORAL_TABLET | ORAL | Status: DC | PRN

## 2018-03-09 MED ORDER — SPIRONOLACTONE 25 MG PO TABS
12.5000 mg | ORAL_TABLET | ORAL | Status: DC
  Administered 2018-03-10: 12.5 mg via ORAL
  Filled 2018-03-09: qty 0.5

## 2018-03-09 MED ORDER — ACETAMINOPHEN 10 MG/ML IV SOLN
INTRAVENOUS | Status: DC | PRN
  Administered 2018-03-09: 1000 mg via INTRAVENOUS

## 2018-03-09 MED ORDER — VITAMIN D3 25 MCG (1000 UNIT) PO TABS
1000.0000 [IU] | ORAL_TABLET | Freq: Every day | ORAL | Status: DC
  Administered 2018-03-10 – 2018-03-11 (×2): 1000 [IU] via ORAL
  Filled 2018-03-09 (×5): qty 1

## 2018-03-09 MED ORDER — METOPROLOL TARTRATE 50 MG PO TABS
100.0000 mg | ORAL_TABLET | Freq: Two times a day (BID) | ORAL | Status: DC
  Administered 2018-03-09 – 2018-03-11 (×3): 100 mg via ORAL
  Filled 2018-03-09 (×4): qty 2

## 2018-03-09 MED ORDER — CEFAZOLIN SODIUM-DEXTROSE 2-4 GM/100ML-% IV SOLN
2.0000 g | Freq: Four times a day (QID) | INTRAVENOUS | Status: AC
  Administered 2018-03-09 – 2018-03-10 (×4): 2 g via INTRAVENOUS
  Filled 2018-03-09 (×4): qty 100

## 2018-03-09 MED ORDER — LIDOCAINE HCL (PF) 2 % IJ SOLN
INTRAMUSCULAR | Status: AC
  Filled 2018-03-09: qty 10

## 2018-03-09 MED ORDER — PANTOPRAZOLE SODIUM 40 MG PO TBEC
40.0000 mg | DELAYED_RELEASE_TABLET | Freq: Two times a day (BID) | ORAL | Status: DC
  Administered 2018-03-09 – 2018-03-11 (×4): 40 mg via ORAL
  Filled 2018-03-09 (×4): qty 1

## 2018-03-09 MED ORDER — LOSARTAN POTASSIUM-HCTZ 50-12.5 MG PO TABS: 1.0000 | ORAL_TABLET | Freq: Every day | ORAL | Status: DC

## 2018-03-09 MED ORDER — ACETAMINOPHEN 325 MG PO TABS
325.0000 mg | ORAL_TABLET | Freq: Four times a day (QID) | ORAL | Status: DC | PRN
Start: 2018-03-10 — End: 2018-03-11

## 2018-03-09 MED ORDER — TRANEXAMIC ACID 1000 MG/10ML IV SOLN
1000.0000 mg | Freq: Once | INTRAVENOUS | Status: AC
  Administered 2018-03-09: 1000 mg via INTRAVENOUS
  Filled 2018-03-09: qty 1100

## 2018-03-09 MED ORDER — WARFARIN - PHYSICIAN DOSING INPATIENT: Freq: Every day | Status: DC

## 2018-03-09 MED ORDER — MAGNESIUM HYDROXIDE 400 MG/5ML PO SUSP
30.0000 mL | Freq: Every day | ORAL | Status: DC
  Administered 2018-03-10 – 2018-03-11 (×2): 30 mL via ORAL
  Filled 2018-03-09 (×2): qty 30

## 2018-03-09 MED ORDER — CEFAZOLIN SODIUM-DEXTROSE 2-3 GM-%(50ML) IV SOLR
INTRAVENOUS | Status: DC | PRN
  Administered 2018-03-09: 2 g via INTRAVENOUS

## 2018-03-09 MED ORDER — SODIUM CHLORIDE 0.9 % IV SOLN
INTRAVENOUS | Status: DC
  Administered 2018-03-09 (×2): via INTRAVENOUS

## 2018-03-09 MED ORDER — ONDANSETRON HCL 4 MG PO TABS: 4.0000 mg | ORAL_TABLET | Freq: Four times a day (QID) | ORAL | Status: DC | PRN

## 2018-03-09 MED ORDER — GLYCOPYRROLATE 0.2 MG/ML IJ SOLN
INTRAMUSCULAR | Status: AC
  Filled 2018-03-09: qty 1

## 2018-03-09 MED ORDER — MENTHOL 3 MG MT LOZG
1.0000 | LOZENGE | OROMUCOSAL | Status: DC | PRN
  Filled 2018-03-09: qty 9

## 2018-03-09 MED ORDER — CEFAZOLIN SODIUM-DEXTROSE 2-4 GM/100ML-% IV SOLN
INTRAVENOUS | Status: AC
  Filled 2018-03-09: qty 100

## 2018-03-09 MED ORDER — DIPHENHYDRAMINE HCL 12.5 MG/5ML PO ELIX: 12.5000 mg | ORAL_SOLUTION | ORAL | Status: DC | PRN

## 2018-03-09 MED ORDER — LACTATED RINGERS IV SOLN
INTRAVENOUS | Status: DC
  Administered 2018-03-09 (×2): via INTRAVENOUS

## 2018-03-09 MED ORDER — METOCLOPRAMIDE HCL 10 MG PO TABS
10.0000 mg | ORAL_TABLET | Freq: Three times a day (TID) | ORAL | Status: AC
  Administered 2018-03-09 – 2018-03-11 (×5): 10 mg via ORAL
  Filled 2018-03-09 (×5): qty 1

## 2018-03-09 MED ORDER — GABAPENTIN 300 MG PO CAPS
ORAL_CAPSULE | ORAL | Status: AC
  Administered 2018-03-09: 300 mg via ORAL
  Filled 2018-03-09: qty 1

## 2018-03-09 MED ORDER — FUROSEMIDE 20 MG PO TABS
20.0000 mg | ORAL_TABLET | ORAL | Status: DC
  Administered 2018-03-10: 20 mg via ORAL
  Filled 2018-03-09: qty 1

## 2018-03-09 MED ORDER — GABAPENTIN 300 MG PO CAPS
300.0000 mg | ORAL_CAPSULE | Freq: Once | ORAL | Status: AC
  Administered 2018-03-09: 300 mg via ORAL

## 2018-03-09 MED ORDER — HYDROMORPHONE HCL 1 MG/ML IJ SOLN: 0.5000 mg | INTRAMUSCULAR | Status: DC | PRN

## 2018-03-09 MED ORDER — BUPIVACAINE HCL (PF) 0.5 % IJ SOLN
INTRAMUSCULAR | Status: AC
  Filled 2018-03-09: qty 10

## 2018-03-09 MED ORDER — HYDROCHLOROTHIAZIDE 12.5 MG PO CAPS
12.5000 mg | ORAL_CAPSULE | Freq: Every day | ORAL | Status: DC
Start: 2018-03-09 — End: 2018-03-11
  Administered 2018-03-10 – 2018-03-11 (×2): 12.5 mg via ORAL
  Filled 2018-03-09: qty 1

## 2018-03-09 MED ORDER — AMLODIPINE BESYLATE 5 MG PO TABS
5.0000 mg | ORAL_TABLET | Freq: Every day | ORAL | Status: DC
  Administered 2018-03-10 – 2018-03-11 (×2): 5 mg via ORAL
  Filled 2018-03-09 (×2): qty 1

## 2018-03-09 MED ORDER — CELECOXIB 200 MG PO CAPS
400.0000 mg | ORAL_CAPSULE | Freq: Once | ORAL | Status: AC
  Administered 2018-03-09: 400 mg via ORAL

## 2018-03-09 MED ORDER — NEOMYCIN-POLYMYXIN B GU 40-200000 IR SOLN
Status: DC | PRN
Start: 2018-03-09 — End: 2018-03-09
  Administered 2018-03-09: 14 mL

## 2018-03-09 MED ORDER — FENTANYL CITRATE (PF) 100 MCG/2ML IJ SOLN
INTRAMUSCULAR | Status: DC | PRN
  Administered 2018-03-09: 100 ug via INTRAVENOUS

## 2018-03-09 MED ORDER — DEXAMETHASONE SODIUM PHOSPHATE 10 MG/ML IJ SOLN
8.0000 mg | Freq: Once | INTRAMUSCULAR | Status: AC
  Administered 2018-03-09: 8 mg via INTRAVENOUS

## 2018-03-09 MED ORDER — SODIUM CHLORIDE 0.9 % IV SOLN
INTRAVENOUS | Status: DC | PRN
  Administered 2018-03-09: 20 ug/min via INTRAVENOUS

## 2018-03-09 MED ORDER — GABAPENTIN 300 MG PO CAPS
300.0000 mg | ORAL_CAPSULE | Freq: Every day | ORAL | Status: DC
  Administered 2018-03-09 – 2018-03-10 (×2): 300 mg via ORAL
  Filled 2018-03-09 (×2): qty 1

## 2018-03-09 MED ORDER — TRANEXAMIC ACID 1000 MG/10ML IV SOLN
INTRAVENOUS | Status: DC | PRN
  Administered 2018-03-09: 1000 mg via INTRAVENOUS

## 2018-03-09 MED ORDER — CHLORHEXIDINE GLUCONATE 4 % EX LIQD: 60.0000 mL | Freq: Once | CUTANEOUS | Status: DC

## 2018-03-09 MED ORDER — PROPOFOL 10 MG/ML IV BOLUS
INTRAVENOUS | Status: AC
  Filled 2018-03-09: qty 20

## 2018-03-09 MED ORDER — CELECOXIB 200 MG PO CAPS
ORAL_CAPSULE | ORAL | Status: AC
  Administered 2018-03-09: 400 mg via ORAL
  Filled 2018-03-09: qty 2

## 2018-03-09 MED ORDER — ATORVASTATIN CALCIUM 10 MG PO TABS
10.0000 mg | ORAL_TABLET | Freq: Every day | ORAL | Status: DC
  Administered 2018-03-09 – 2018-03-10 (×2): 10 mg via ORAL
  Filled 2018-03-09 (×2): qty 1

## 2018-03-09 MED ORDER — PROPOFOL 10 MG/ML IV BOLUS
INTRAVENOUS | Status: DC | PRN
  Administered 2018-03-09 (×2): 11 mg via INTRAVENOUS

## 2018-03-09 SURGICAL SUPPLY — 54 items
BLADE DRUM FLTD (BLADE) ×2 IMPLANT
BLADE SAW 1 (BLADE) ×2 IMPLANT
CANISTER SUCT 1200ML W/VALVE (MISCELLANEOUS) ×2 IMPLANT
CANISTER SUCT 3000ML PPV (MISCELLANEOUS) ×4 IMPLANT
CAPT HIP TOTAL 2 ×1 IMPLANT
CARTRIDGE OIL MAESTRO DRILL (MISCELLANEOUS) ×1 IMPLANT
DIFFUSER DRILL AIR PNEUMATIC (MISCELLANEOUS) ×2 IMPLANT
DRAPE INCISE IOBAN 66X60 STRL (DRAPES) ×2 IMPLANT
DRAPE SHEET LG 3/4 BI-LAMINATE (DRAPES) ×2 IMPLANT
DRSG DERMACEA 8X12 NADH (GAUZE/BANDAGES/DRESSINGS) ×2 IMPLANT
DRSG OPSITE POSTOP 4X12 (GAUZE/BANDAGES/DRESSINGS) ×2 IMPLANT
DRSG OPSITE POSTOP 4X14 (GAUZE/BANDAGES/DRESSINGS) IMPLANT
DRSG TEGADERM 4X4.75 (GAUZE/BANDAGES/DRESSINGS) ×2 IMPLANT
DURAPREP 26ML APPLICATOR (WOUND CARE) ×2 IMPLANT
ELECT BLADE 6.5 EXT (BLADE) ×2 IMPLANT
ELECT CAUTERY BLADE 6.4 (BLADE) ×2 IMPLANT
GLOVE BIOGEL M STRL SZ7.5 (GLOVE) ×4 IMPLANT
GLOVE BIOGEL PI IND STRL 9 (GLOVE) ×1 IMPLANT
GLOVE BIOGEL PI INDICATOR 9 (GLOVE) ×1
GLOVE INDICATOR 8.0 STRL GRN (GLOVE) ×2 IMPLANT
GLOVE SURG SYN 9.0  PF PI (GLOVE) ×1
GLOVE SURG SYN 9.0 PF PI (GLOVE) ×1 IMPLANT
GOWN STRL REUS W/ TWL LRG LVL3 (GOWN DISPOSABLE) ×2 IMPLANT
GOWN STRL REUS W/TWL 2XL LVL3 (GOWN DISPOSABLE) ×2 IMPLANT
GOWN STRL REUS W/TWL LRG LVL3 (GOWN DISPOSABLE) ×4
HEMOVAC 400CC 10FR (MISCELLANEOUS) ×2 IMPLANT
HOLDER FOLEY CATH W/STRAP (MISCELLANEOUS) ×2 IMPLANT
HOOD PEEL AWAY FLYTE STAYCOOL (MISCELLANEOUS) ×4 IMPLANT
KIT TURNOVER KIT A (KITS) ×2 IMPLANT
NDL SAFETY ECLIPSE 18X1.5 (NEEDLE) ×1 IMPLANT
NEEDLE HYPO 18GX1.5 SHARP (NEEDLE) ×2
NS IRRIG 500ML POUR BTL (IV SOLUTION) ×3 IMPLANT
OIL CARTRIDGE MAESTRO DRILL (MISCELLANEOUS) ×2
PACK HIP PROSTHESIS (MISCELLANEOUS) ×2 IMPLANT
PIN STEIN THRED 5/32 (Pin) ×2 IMPLANT
PULSAVAC PLUS IRRIG FAN TIP (DISPOSABLE) ×2
SOL .9 NS 3000ML IRR  AL (IV SOLUTION) ×1
SOL .9 NS 3000ML IRR AL (IV SOLUTION) ×1
SOL .9 NS 3000ML IRR UROMATIC (IV SOLUTION) ×1 IMPLANT
SOL PREP PVP 2OZ (MISCELLANEOUS) ×2
SOLUTION PREP PVP 2OZ (MISCELLANEOUS) ×1 IMPLANT
SPONGE DRAIN TRACH 4X4 STRL 2S (GAUZE/BANDAGES/DRESSINGS) ×2 IMPLANT
STAPLER SKIN PROX 35W (STAPLE) ×2 IMPLANT
SUT ETHIBOND #5 BRAIDED 30INL (SUTURE) ×2 IMPLANT
SUT VIC AB 0 CT1 36 (SUTURE) ×2 IMPLANT
SUT VIC AB 1 CT1 36 (SUTURE) ×4 IMPLANT
SUT VIC AB 2-0 CT1 27 (SUTURE) ×2
SUT VIC AB 2-0 CT1 TAPERPNT 27 (SUTURE) ×1 IMPLANT
SYR 20CC LL (SYRINGE) ×2 IMPLANT
TAPE ADH 3 LX (MISCELLANEOUS) ×2 IMPLANT
TAPE TRANSPORE STRL 2 31045 (GAUZE/BANDAGES/DRESSINGS) ×2 IMPLANT
TIP FAN IRRIG PULSAVAC PLUS (DISPOSABLE) ×1 IMPLANT
TOWEL OR 17X26 4PK STRL BLUE (TOWEL DISPOSABLE) ×2 IMPLANT
TRAY FOLEY MTR SLVR 16FR STAT (SET/KITS/TRAYS/PACK) ×2 IMPLANT

## 2018-03-09 NOTE — Transfer of Care (Signed)
Immediate Anesthesia Transfer of Care Note  [name]  Procedure(s) Performed: TOTAL HIP ARTHROPLASTY (Right )  Patient Location: PACU  Anesthesia Type:Spinal  Level of Consciousness: awake and patient cooperative  Airway & Oxygen Therapy: Patient Spontanous Breathing and Patient connected to nasal cannula oxygen  Post-op Assessment: Report given to RN and Post -op Vital signs reviewed and stable  Post vital signs: Reviewed and stable  Last Vitals:  Vitals Value Taken Time  BP    Temp    Pulse 88 03/09/2018 10:41 AM  Resp    SpO2 97 % 03/09/2018 10:41 AM  Vitals shown include unvalidated device data.  Last Pain:  Vitals:   03/09/18 0614  TempSrc: Oral  PainSc: 0-No pain         Complications: No apparent anesthesia complications

## 2018-03-09 NOTE — Anesthesia Post-op Follow-up Note (Signed)
Anesthesia QCDR form completed.        

## 2018-03-09 NOTE — Clinical Social Work Note (Signed)
CSW received referral for SNF.  Pt is recommending home health, plan to discharge home with home health.  CSW to sign off please re-consult if social work needs arise.  Ervin KnackEric R. Rossi Silvestro, MSW, [employer]WA [phone]

## 2018-03-09 NOTE — Anesthesia Preprocedure Evaluation (Addendum)
Anesthesia Evaluation  Patient identified by MRN, date of birth, ID band Patient awake    Reviewed: Allergy & Precautions, H&P , NPO status , Patient's Chart, lab work & pertinent test results  History of Anesthesia Complications Negative for: history of anesthetic complications  Airway Mallampati: III  TM Distance: <3 FB Neck ROM: full    Dental  (+) Chipped, Poor Dentition, Missing   Pulmonary neg shortness of breath,           Cardiovascular Exercise Tolerance: Good hypertension, (-) angina+ CAD and + Cardiac Stents  (-) Past MI and (-) DOE      Neuro/Psych negative neurological ROS  negative psych ROS   GI/Hepatic Neg liver ROS, GERD  ,  Endo/Other  Hypothyroidism   Renal/GU Renal disease     Musculoskeletal  (+) Arthritis ,   Abdominal   Peds  Hematology negative hematology ROS (+)   Anesthesia Other Findings Past Medical History: No date: Anemia No date: Arthritis No date: Atrial fibrillation (HCC) No date: Chronic kidney disease No date: Coronary artery disease No date: GERD (gastroesophageal reflux disease) No date: HLD (hyperlipidemia) No date: Hypertension No date: Hypothyroidism  Past Surgical History: No date: ABDOMINAL HYSTERECTOMY No date: APPENDECTOMY No date: BACK SURGERY No date: CARDIAC CATHETERIZATION No date: CORONARY STENT PLACEMENT No date: JOINT REPLACEMENT; Left     Comment:  hip No date: JOINT REPLACEMENT; Bilateral     Comment:  knees No date: NISSEN FUNDOPLICATION No date: THORACOTOMY  BMI    Body Mass Index:  23.90 kg/m      Reproductive/Obstetrics negative OB ROS                             Anesthesia Physical Anesthesia Plan  ASA: III  Anesthesia Plan: Spinal   Post-op Pain Management:    Induction:   PONV Risk Score and Plan:   Airway Management Planned: Natural Airway and Nasal Cannula  Additional Equipment:   Intra-op  Plan:   Post-operative Plan:   Informed Consent: I have reviewed the patients History and Physical, chart, labs and discussed the procedure including the risks, benefits and alternatives for the proposed anesthesia with the patient or authorized representative who has indicated his/her understanding and acceptance.   Dental Advisory Given  Plan Discussed with: Anesthesiologist, CRNA and Surgeon  Anesthesia Plan Comments: (Patient reports no bleeding problems and no anticoagulant use (patients reports no warfarin in the past 6 days and normal INR today).  Plan for spinal with backup GA  Patient consented for risks of anesthesia including but not limited to:  - adverse reactions to medications - risk of bleeding, infection, nerve damage and headache - risk of failed spinal - damage to teeth, lips or other oral mucosa - sore throat or hoarseness - Damage to heart, brain, lungs or loss of life  Patient voiced understanding.)       Anesthesia Quick Evaluation

## 2018-03-09 NOTE — H&P (Signed)
The patient has been re-examined, and the chart reviewed, and there have been no interval changes to the documented history and physical.    The risks, benefits, and alternatives have been discussed at length. The patient expressed understanding of the risks benefits and agreed with plans for surgical intervention.  Zaine Elsass P. Benji Poynter, Jr. M.D.    

## 2018-03-09 NOTE — Progress Notes (Signed)
Patient is admitted to room 145. Family at bedside. Honeycomb dressing to right hip intact. Ice to hip. Foley, hemovac, TEDs and SCDs in place. A&O x4, HOH. Bed alarm on for safety. Reviewed POC and orders.

## 2018-03-09 NOTE — Op Note (Signed)
OPERATIVE NOTE  DATE OF SURGERY:  03/09/2018  PATIENT NAME:  Karen Cunningham   DOB: 01/16/1931  MRN: [id number]  PRE-OPERATIVE DIAGNOSIS: Degenerative arthrosis of the right hip, primary  POST-OPERATIVE DIAGNOSIS:  Same  PROCEDURE:  Right total hip arthroplasty  SURGEON:  Jena Gauss. M.D.  ASSISTANT:  Van Clines, PA (present and scrubbed throughout the case, critical for assistance with exposure, retraction, instrumentation, and closure)  ANESTHESIA: spinal  ESTIMATED BLOOD LOSS: 100 mL  FLUIDS REPLACED: 1400 mL of crystalloid  DRAINS: 2 medium drains to a Hemovac reservoir  IMPLANTS UTILIZED: DePuy 12 mm small stature AML femoral stem, 52 mm OD Pinnacle 100 acetabular component, +4 mm neutral Pinnacle Marathon polyethylene insert, and a 36 mm M-SPEC -2 mm hip ball  INDICATIONS FOR SURGERY: Karen Cunningham is a 84 y.o. year old female with a long history of progressive hip and groin  pain. X-rays demonstrated severe degenerative changes. The patient had not seen any significant improvement despite conservative nonsurgical intervention. After discussion of the risks and benefits of surgical intervention, the patient expressed understanding of the risks benefits and agree with plans for total hip arthroplasty.   The risks, benefits, and alternatives were discussed at length including but not limited to the risks of infection, bleeding, nerve injury, stiffness, blood clots, the need for revision surgery, limb length inequality, dislocation, cardiopulmonary complications, among others, and they were willing to proceed.  PROCEDURE IN DETAIL: The patient was brought into the operating room and, after adequate spinal anesthesia was achieved, the patient was placed in a left lateral decubitus position. Axillary roll was placed and all bony prominences were well-padded. The patient's right hip was cleaned and prepped with alcohol and DuraPrep and draped in the usual sterile fashion. A  "timeout" was performed as per usual protocol. A lateral curvilinear incision was made gently curving towards the posterior superior iliac spine. The IT band was incised in line with the skin incision and the fibers of the gluteus maximus were split in line. The piriformis tendon was identified, skeletonized, and incised at its insertion to the proximal femur and reflected posteriorly. A T type posterior capsulotomy was performed. Prior to dislocation of the femoral head, a threaded Steinmann pin was inserted through a separate stab incision into the pelvis superior to the acetabulum and bent in the form of a stylus so as to assess limb length and hip offset throughout the procedure. The femoral head was then dislocated posteriorly. Inspection of the femoral head demonstrated severe degenerative changes with full-thickness loss of articular cartilage. The femoral neck cut was performed using an oscillating saw. The anterior capsule was elevated off of the femoral neck using a periosteal elevator. Attention was then directed to the acetabulum. The remnant of the labrum was excised using electrocautery. Inspection of the acetabulum also demonstrated significant degenerative changes. The acetabulum was reamed in sequential fashion up to a 51 mm diameter. Good punctate bleeding bone was encountered. A 52 mm Pinnacle 100 acetabular component was positioned and impacted into place. Good scratch fit was appreciated. A +4 mm neutral polyethylene trial was inserted.  Attention was then directed to the proximal femur. A hole for reaming of the proximal femoral canal was created using a high-speed burr. The femoral canal was reamed in sequential fashion up to a 11.5 mm diameter. This allowed for approximately 6.5 cm of scratch fit.  It was less elected to ream up to a 12 mm diameter to allow for a line to  line fit.  Serial broaches were inserted up to a 12 mm small stature femoral broach. Calcar region was planed and a trial  reduction was performed using a 36 mm hip ball with a -2 mm neck length. Good equalization of limb lengths and hip offset was appreciated and excellent stability was noted both anteriorly and posteriorly. Trial components were removed. The acetabular shell was irrigated with copious amounts of normal saline with antibiotic solution and suctioned dry. A +4 mm neutral Pinnacle Marathon polyethylene insert was positioned and impacted into place. Next, a 12 mm small stature AML femoral stem was positioned and impacted into place. Excellent scratch fit was appreciated. A trial reduction was again performed with a 36 mm hip ball with a -2 mm neck length. Again, good equalization of limb lengths was appreciated and excellent stability appreciated both anteriorly and posteriorly. The hip was then dislocated and the trial hip ball was removed. The Morse taper was cleaned and dried. A 36 mm M-SPEC hip ball with a -2 mm neck length was placed on the trunnion and impacted into place. The hip was then reduced and placed through range of motion. Excellent stability was appreciated both anteriorly and posteriorly.  The wound was irrigated with copious amounts of normal saline with antibiotic solution and suctioned dry. Good hemostasis was appreciated. The posterior capsulotomy was repaired using #5 Ethibond. Piriformis tendon was reapproximated to the undersurface of the gluteus medius tendon using #5 Ethibond. Two medium drains were placed in the wound bed and brought out through separate stab incisions to be attached to a Hemovac reservoir. The IT band was reapproximated using interrupted sutures of #1 Vicryl. Subcutaneous tissue was approximated using first #0 Vicryl followed by #2-0 Vicryl. The skin was closed with skin staples.  The patient tolerated the procedure well and was transported to the recovery room in stable condition.   Jena GaussJames P Fedra Lanter, Jr., M.D.

## 2018-03-09 NOTE — Anesthesia Procedure Notes (Signed)
Spinal  Patient location during procedure: OR Start time: 03/09/2018 7:42 AM End time: 03/09/2018 7:47 AM Staffing Resident/CRNA: Bernardo Heater, CRNA Performed: resident/CRNA  Preanesthetic Checklist Completed: patient identified, site marked, surgical consent, pre-op evaluation, timeout performed, IV checked, risks and benefits discussed and monitors and equipment checked Spinal Block Patient position: sitting Prep: ChloraPrep Patient monitoring: heart rate, continuous pulse ox, blood pressure and cardiac monitor Approach: midline Location: L4-5 Injection technique: single-shot Needle Needle type: Introducer and Pencan  Needle gauge: 24 G Needle length: 9 cm Additional Notes Negative paresthesia. Negative blood return. Positive free-flowing CSF. Expiration date of kit checked and confirmed. Patient tolerated procedure well, without complications.

## 2018-03-09 NOTE — Progress Notes (Signed)
Physical Therapy Evaluation Patient Details Name: Karen Cunningham MRN: [id number] DOB: 04-09-1931 Today's Date: 03/09/2018   History of Present Illness  Pt underwent R THR without reported post-op complications. PMH includes hypothyroidism, Afib, CKD, CAD, HTN, and L hip THR. PT evaluation performed POD#0.    Clinical Impression  Pt admitted with above diagnosis. Pt currently with functional limitations due to the deficits listed below (see PT Problem List).  Pt is modified independent for bed mobility but requires minA+1 to come to standing. Pt able to take short, shuffling steps from bed to recliner. Cues for proper sequencing with rolling walker. No RLE buckling noted and encouragement provided to increase weight shifting to RLE. Pt with groaning and crying due to increase in pain which improves once she is sitting in recliner. Cues for R hip ER during R turning to sit down in recliner. Pt should be appropriate to return home with [facility] PT and support from family. Pt will benefit from PT services to address deficits in strength, balance, and mobility in order to return to full function at home.     Follow Up Recommendations Home health PT    Equipment Recommendations  None recommended by PT;Other (comment)(May benefit from tub bench for safe transfers)    Recommendations for Other Services       Precautions / Restrictions Precautions Precautions: Posterior Hip Precaution Booklet Issued: Yes (comment) Restrictions Weight Bearing Restrictions: Yes RLE Weight Bearing: Weight bearing as tolerated      Mobility  Bed Mobility Overal bed mobility: Modified Independent             General bed mobility comments: Increased time required. HOB elevated and use of bed rails. No external assist required  Transfers Overall transfer level: Needs assistance Equipment used: Rolling walker (2 wheeled) Transfers: Sit to/from Stand Sit to Stand: Min assist         General transfer comment:  Cues for safe hand placement. Decreased weight shifting to RLE. Initially requires minA+1 to stabilize in standing  Ambulation/Gait Ambulation/Gait assistance: Min guard Gait Distance (Feet): 5 Feet Assistive device: Rolling walker (2 wheeled)       General Gait Details: Pt able to take short, shuffling steps from bed to recliner. Cues for proper sequencing with rolling walker. No RLE buckling noted and encouragement provided to increase weight shifting to RLE. Pt with groaning and crying due to increase in pain which improves once she is sitting in recliner. Cues for R hip ER during R turning to sit down in recliner  Stairs            Wheelchair Mobility    Modified Rankin (Stroke Patients Only)       Balance Overall balance assessment: Needs assistance Sitting-balance support: No upper extremity supported Sitting balance-Leahy Scale: Good     Standing balance support: Bilateral upper extremity supported Standing balance-Leahy Scale: Fair Standing balance comment: Able to balance with bilateral UE support on walker                             Pertinent Vitals/Pain Pain Assessment: Faces Faces Pain Scale: Hurts whole lot Pain Location: R hip, denies pain at rest but pt groans and cries with ambulation Pain Intervention(s): Monitored during session;Premedicated before session;Limited activity within patient's tolerance    Home Living Family/patient expects to be discharged to:: Private residence Living Arrangements: Alone;Other (Comment)(Daughter in law will stay with patient at discharge) Available Help at Discharge:  Family Type of Home: House Home Access: Stairs to enter Entrance Stairs-Rails: Right Entrance Stairs-Number of Steps: 5 Home Layout: One level Home Equipment: Cane - single point;Walker - 2 wheels;Shower seat([facility])      Prior Function Level of Independence: Needs assistance   Gait / Transfers Assistance Needed:  Ambulates with single point cane and drives. No falls in the last 12 months  ADL's / Homemaking Assistance Needed: Independent with ADLs, assist from family with IADLs        Hand Dominance   Dominant Hand: Right    Extremity/Trunk Assessment   Upper Extremity Assessment Upper Extremity Assessment: Overall WFL for tasks assessed    Lower Extremity Assessment Lower Extremity Assessment: RLE deficits/detail RLE Deficits / Details: Pt requires 2 finger assist for R SLR. Able to perform SAQ without assist. Full DF/PF. Sensation intact to light touch       Communication   Communication: No difficulties  Cognition Arousal/Alertness: Awake/alert Behavior During Therapy: WFL for tasks assessed/performed Overall Cognitive Status: History of cognitive impairments - at baseline                                 General Comments: Pt is AOx4 but regularly forgetful of details provided during evaluation      General Comments      Exercises Total Joint Exercises Ankle Circles/Pumps: Both;10 reps Quad Sets: Both;10 reps Gluteal Sets: Both;10 reps Towel Squeeze: Both;10 reps Short Arc Quad: Right;10 reps Heel Slides: Right;10 reps Hip ABduction/ADduction: Right;10 reps Straight Leg Raises: Right;10 reps   Assessment/Plan    PT Assessment Patient needs continued PT services  PT Problem List Decreased strength;Decreased activity tolerance;Decreased balance;Decreased mobility       PT Treatment Interventions DME instruction;Gait training;Stair training;Functional mobility training;Therapeutic activities;Therapeutic exercise;Balance training;Neuromuscular re-education;Patient/family education    PT Goals (Current goals can be found in the Care Plan section)  Acute Rehab PT Goals Patient Stated Goal: Return to prior level of function at home PT Goal Formulation: With patient/family Time For Goal Achievement: 03/23/18 Potential to Achieve Goals: Good    Frequency  BID   Barriers to discharge        Co-evaluation               AM-PAC PT "6 Clicks" Daily Activity  Outcome Measure Difficulty turning over in bed (including adjusting bedclothes, sheets and blankets)?: A Lot Difficulty moving from lying on back to sitting on the side of the bed? : A Lot Difficulty sitting down on and standing up from a chair with arms (e.g., wheelchair, bedside commode, etc,.)?: A Little Help needed moving to and from a bed to chair (including a wheelchair)?: A Little Help needed walking in hospital room?: A Little Help needed climbing 3-5 steps with a railing? : Total 6 Click Score: 14    End of Session Equipment Utilized During Treatment: Gait belt Activity Tolerance: Patient tolerated treatment well Patient left: in chair;with call bell/phone within reach;with chair alarm set;with SCD's reapplied;Other (comment)(abduction pillow between knees, ice pack on hip) Nurse Communication: Mobility status PT Visit Diagnosis: Unsteadiness on feet (R26.81);Muscle weakness (generalized) (M62.81);Pain Pain - Right/Left: Right Pain - part of body: Hip    Time: [id number] PT Time Calculation (min) (ACUTE ONLY): 43 min   Charges:   PT Evaluation $PT Eval Low Complexity: 1 Low PT Treatments $Therapeutic Exercise: 8-22 mins   PT G Codes:  Sharalyn Ink Lakaisha Danish PT, DPT    Kalei Meda 03/09/2018, 5:21 PM

## 2018-03-10 LAB — CBC
HCT: 31.6 % — ABNORMAL LOW (ref 35.0–47.0)
Hemoglobin: 10.8 g/dL — ABNORMAL LOW (ref 12.0–16.0)
MCH: 30.1 pg (ref 26.0–34.0)
MCHC: 34.1 g/dL (ref 32.0–36.0)
MCV: 88.3 fL (ref 80.0–100.0)
Platelets: 185 10*3/uL (ref 150–440)
RBC: 3.57 MIL/uL — ABNORMAL LOW (ref 3.80–5.20)
RDW: 14 % (ref 11.5–14.5)
WBC: 11.4 10*3/uL — ABNORMAL HIGH (ref 3.6–11.0)

## 2018-03-10 LAB — BASIC METABOLIC PANEL
Anion gap: 8 (ref 5–15)
BUN: 18 mg/dL (ref 6–20)
CALCIUM: 8.5 mg/dL — AB (ref 8.9–10.3)
CO2: 22 mmol/L (ref 22–32)
CREATININE: 0.99 mg/dL (ref 0.44–1.00)
Chloride: 104 mmol/L (ref 101–111)
GFR calc Af Amer: 59 mL/min — ABNORMAL LOW (ref >60)
GFR calc non Af Amer: 51 mL/min — ABNORMAL LOW (ref >60)
GLUCOSE: 164 mg/dL — AB (ref 65–99)
Potassium: 3.9 mmol/L (ref 3.5–5.1)
Sodium: 134 mmol/L — ABNORMAL LOW (ref 135–145)

## 2018-03-10 LAB — PROTIME-INR
INR: 1.11
Prothrombin Time: 14.2 seconds (ref 11.4–15.2)

## 2018-03-10 NOTE — Anesthesia Postprocedure Evaluation (Signed)
Anesthesia Post Note  Patient: Karen MagesColleen B Eden  Procedure(s) Performed: TOTAL HIP ARTHROPLASTY (Right )  Patient location during evaluation: Nursing Unit Anesthesia Type: Spinal Level of consciousness: awake, awake and alert and oriented Pain management: pain level controlled Vital Signs Assessment: post-procedure vital signs reviewed and stable Respiratory status: spontaneous breathing, nonlabored ventilation and respiratory function stable Cardiovascular status: stable Anesthetic complications: no     Last Vitals:  Vitals:   03/09/18 2342 03/10/18 0408  BP: (!) 115/54 119/76  Pulse: 77 (!) 106  Resp: 18 18  Temp: 36.4 C (!) 36.4 C  SpO2: 99% 97%    Last Pain:  Vitals:   03/10/18 0408  TempSrc: Axillary  PainSc:                  Casey Burkitthuy Alani Lacivita

## 2018-03-10 NOTE — Progress Notes (Signed)
ORTHOPAEDICS PROGRESS NOTE  PATIENT NAME: Jenelle MagesColleen B Debois DOB: [birth year]  MRN: [id number]  POD # 1: Right total hip arthroplasty  Subjective: The patient states that the pain is been controlled.  She rested reasonably well last night. The patient tolerated physical therapy well yesterday after surgery.  Objective: Vital signs in last 24 hours: Temp:  [97.2 F (36.2 C)-98.3 F (36.8 C)] 97.5 F (36.4 C) (06/20 0408) Pulse Rate:  [74-126] 106 (06/20 0408) Resp:  [14-22] 18 (06/20 0408) BP: (87-119)/(49-86) 119/76 (06/20 0408) SpO2:  [91 %-100 %] 97 % (06/20 0408) Weight:  [55.9 kg (123 lb 5 oz)] 55.9 kg (123 lb 5 oz) (06/19 1200)  Intake/Output from previous day: 06/19 0701 - 06/20 0700 In: 2285 [I.V.:1975; IV Piggyback:200] Out: 2850 [Urine:2570; Drains:180; Blood:100]  Recent Labs    03/09/18 0646 03/09/18 0655 03/10/18 0323  WBC  --   --  11.4*  HGB  --  12.6 10.8*  HCT  --  37.0 31.6*  PLT  --   --  185  K  --  3.4* 3.9  CL  --   --  104  CO2  --   --  22  BUN  --   --  18  CREATININE  --   --  0.99  GLUCOSE  --  124* 164*  CALCIUM  --   --  8.5*  INR 1.05  --  1.11    EXAM General: Well-developed well-nourished female seen in no apparent discomfort. Lungs: clear to auscultation Cardiac: Irregular rate and rhythm.  Normal S1-S2.  No gallops or rubs. Right lower extremity: Dressing is dry and intact.  Hemovac is in place and functioning.  Homans test is negative. Neurologic: Awake, alert, and oriented.  Sensory and motor function are grossly intact.  Assessment: Right total hip arthroplasty  Secondary diagnoses: Hypothyroidism Hypertension Hyperlipidemia Gastroesophageal reflux disease Coronary artery disease Chronic kidney disease Atrial fibrillation History of anemia  Plan: Today's goals were reviewed with the patient. Continue with physical therapy and Occupational Therapy as per total hip arthroplasty rehab protocol.  Posterior hip precautions  are to be continued. Plan is to go Home after hospital stay. DVT Prophylaxis - Coumadin, Foot Pumps and TED hose  James P. Angie FavaHooten, Jr. M.D.

## 2018-03-10 NOTE — Evaluation (Signed)
Occupational Therapy Evaluation Patient Details Name: Karen Cunningham MRN: [id number] DOB: 05-18-1931 Today's Date: 03/10/2018    History of Present Illness 84yo female pt underwent R THR without reported post-op complications. PMH includes hypothyroidism, Afib, CKD, CAD, HTN, and L hip THR. PT evaluation performed POD#0.  OT evaluation performed POD#1.   Clinical Impression   Pt seen for OT evaluation this date, POD#1 from above surgery. Pt was independent in all ADLs prior to surgery, however using SPC due to R hip pain. Pt is eager to return to PLOF with less pain and improved safety and independence. Pt currently requires moderate assist for LB dressing and bathing while in seated position due to pain and limited AROM of R hip. Pt able to recall 1/3 posterior total hip precautions at start of session and unable to verbalize how to implement during ADL and mobility. >23 minutes aside from evaluation spent providing pt instruction in posterior total hip precautions and how to implement, self care skills, falls prevention strategies, home/routines modifications, DME/AE for LB bathing and dressing tasks, compression stocking mgt strategies, and car transfer techniques. At end of session pt able to recall 3/3 posterior hip precautions but had difficulty with verbalizing how to implement during mobility and ADL. Pt would benefit from additional instruction in self care skills and techniques to help maintain precautions with or without assistive devices to support recall and carryover prior to discharge. Good family support planned for return home. Will continue to assess for OT needs beyond discharge.      Follow Up Recommendations  Supervision - Intermittent    Equipment Recommendations  3 in 1 bedside commode;Other (comment)(reacher, sock aide, LH shoe horn, LH sponge)    Recommendations for Other Services       Precautions / Restrictions Precautions Precautions: Posterior Hip Precaution  Booklet Issued: No Precaution Comments: Pt able to recall 1/3 at start of session, with additional instruction, pt able to recall 3/3 at end of session. Would benefit from additional education/training to support recall and carryover during ADL and mobility. Restrictions Weight Bearing Restrictions: Yes RLE Weight Bearing: Weight bearing as tolerated      Mobility Bed Mobility Overal bed mobility: Modified Independent             General bed mobility comments: increased time/effort, HOB elevated. No physical assist required  Transfers Overall transfer level: Needs assistance Equipment used: Rolling walker (2 wheeled) Transfers: Sit to/from Stand Sit to Stand: Min guard         General transfer comment: VC for hand and foot placement to maintain precautions    Balance Overall balance assessment: Needs assistance Sitting-balance support: No upper extremity supported Sitting balance-Leahy Scale: Good     Standing balance support: Bilateral upper extremity supported Standing balance-Leahy Scale: Fair                             ADL either performed or assessed with clinical judgement   ADL Overall ADL's : Needs assistance/impaired Eating/Feeding: Sitting;Independent   Grooming: Sitting;Independent   Upper Body Bathing: Sitting;Supervision/ safety   Lower Body Bathing: Sit to/from stand;Moderate assistance Lower Body Bathing Details (indicate cue type and reason): pt instructed in adaptive strategies and AE to maintain precautions while performing LB bathing Upper Body Dressing : Sitting;Supervision/safety   Lower Body Dressing: Sit to/from stand;Moderate assistance;With adaptive equipment Lower Body Dressing Details (indicate cue type and reason): pt instructed in adaptive strategies and AE to maintain  precautions while performing LB dressing Toilet Transfer: RW;Min guard;Ambulation;BSC   Toileting- ArchitectClothing Manipulation and Hygiene: Min guard;Sit  to/from stand       Functional mobility during ADLs: Min guard;Rolling walker       Vision Baseline Vision/History: Wears glasses Wears Glasses: Reading only Patient Visual Report: No change from baseline       Perception     Praxis      Pertinent Vitals/Pain Pain Assessment: 0-10 Pain Score: 3  Pain Location: R hip with mobility Pain Descriptors / Indicators: Aching Pain Intervention(s): Limited activity within patient's tolerance;Monitored during session;Premedicated before session;Repositioned;Ice applied     Hand Dominance Right   Extremity/Trunk Assessment Upper Extremity Assessment Upper Extremity Assessment: Overall WFL for tasks assessed(WFL for age)   Lower Extremity Assessment Lower Extremity Assessment: Defer to PT evaluation;RLE deficits/detail   Cervical / Trunk Assessment Cervical / Trunk Assessment: Normal   Communication Communication Communication: No difficulties   Cognition Arousal/Alertness: Awake/alert Behavior During Therapy: WFL for tasks assessed/performed Overall Cognitive Status: History of cognitive impairments - at baseline                                 General Comments: Pt endorses being "forgetful" and with a "bad memory sometimes"    General Comments       Exercises Other Exercises Other Exercises: Pt educated in falls prevention strategies, compression stocking mgt, and posterior THPs   Shoulder Instructions      Home Living Family/patient expects to be discharged to:: Private residence Living Arrangements: Alone Available Help at Discharge: Family;Available 24 hours/day;Available PRN/intermittently(Dtr in law will be staying with her temporarily, other family nearby) Type of Home: House Home Access: Stairs to enter Entergy CorporationEntrance Stairs-Number of Steps: 5 Entrance Stairs-Rails: Right Home Layout: One level     Bathroom Shower/Tub: Chief Strategy OfficerTub/shower unit   Bathroom Toilet: Handicapped height     Home Equipment:  Cane - single point;Walker - 2 wheels;Shower seat   Additional [facility]      Prior Functioning/Environment Level of Independence: Needs assistance  Gait / Transfers Assistance Needed: Ambulates with single point cane and drives. No falls in the last 12 months ADL's / Homemaking Assistance Needed: Independent with ADLs, assist from family with IADLs            OT Problem List: Decreased strength;Decreased knowledge of use of DME or AE;Decreased range of motion;Decreased knowledge of precautions;Cardiopulmonary status limiting activity;Impaired balance (sitting and/or standing)      OT Treatment/Interventions: Self-care/ADL training;Balance training;Therapeutic exercise;Therapeutic activities;DME and/or AE instruction;Energy conservation;Patient/family education    OT Goals(Current goals can be found in the care plan section) Acute Rehab OT Goals Patient Stated Goal: Return to prior level of function at home OT Goal Formulation: With patient Time For Goal Achievement: 03/24/18 Potential to Achieve Goals: Good ADL Goals Pt Will Perform Lower Body Dressing: with modified independence;sit to/from stand;with adaptive equipment Pt Will Transfer to Toilet: with modified independence;ambulating(BSC over toilet, LRAD for ambulation) Additional ADL Goal #1: Pt will independently instruct family/caregiver in compression stocking mgt including wear schedule and doffing/donning.  OT Frequency: Min 1X/week   Barriers to D/C:            Co-evaluation              AM-PAC PT "6 Clicks" Daily Activity     Outcome Measure Help from another person eating meals?: None Help from another person taking  care of personal grooming?: None Help from another person toileting, which includes using toliet, bedpan, or urinal?: A Little Help from another person bathing (including washing, rinsing, drying)?: A Lot Help from another person to put on and taking off regular upper  body clothing?: None Help from another person to put on and taking off regular lower body clothing?: A Lot 6 Click Score: 19   End of Session Equipment Utilized During Treatment: Gait belt;Rolling walker  Activity Tolerance: Patient tolerated treatment well Patient left: in chair;with call bell/phone within reach;with chair alarm set;with SCD's reapplied;Other (comment)(ice pack on R hip)  OT Visit Diagnosis: Other abnormalities of gait and mobility (R26.89)                Time: 9147-8295 OT Time Calculation (min): 30 min Charges:  OT General Charges $OT Visit: 1 Visit OT Evaluation $OT Eval Low Complexity: 1 Low OT Treatments $Self Care/Home Management : 23-37 mins  Richrd Prime, MPH, MS, OTR/L ascom [phone] 03/10/18, 10:37 AM

## 2018-03-10 NOTE — Care Management Note (Signed)
Case Management Note  Patient Details  Name: Karen Cunningham MRN: [id number] Date of Birth: 07/13/1931  Subjective/Objective:   POD # 1 right total hip arthroplasty. Met with patient and her daughter in law, Karen Cunningham at bedside. Patient lives with Karen Cunningham. She has a walker and bedside commode. She is on coumadin. Please advise if she will need coumadin checks by home health and if so add nursing to home health orders and frequency of PT/INR levels.  Patient and Karen Cunningham told RNCM they wanted [facility] for their home health agency. Patient will need PT/ OT. Referral to [facility]. They are requesting Karen Cunningham. [facility] notified.                  Action/Plan: [facility] for PT/OT  Expected Discharge Date:  03/11/18               Expected Discharge Plan:  [place]  In-House Referral:     Discharge planning Services  CM Consult  Post Acute Care Choice:  Home Health Choice offered to:  Patient, Adult Children  DME Arranged:    DME Agency:     HH Arranged:  PT, OT [place] Agency:  [facility] (formerly [employer])  Status of Service:  In process, will continue to follow  If discussed at Long Length of Stay Meetings, dates discussed:    Additional Comments:  Jolly Mango, RN 03/10/2018, 2:31 PM

## 2018-03-10 NOTE — NC FL2 (Signed)
MEDICAID FL2 LEVEL OF CARE SCREENING TOOL     IDENTIFICATION  Patient Name: Karen Cunningham Birthdate: 15-Aug-1931 Sex: female Admission Date (Current Location): 03/09/2018  [place] Number:  Chiropodist and Address:  [facility], [address] [place], [place] [address]      Provider Number: [id number]  Attending Physician Name and Address:  Donato Heinz, MD  Relative Name and Phone Number:       Current Level of Care: Hospital Recommended Level of Care: Skilled Nursing Facility Prior Approval Number:    Date Approved/Denied:   PASRR Number: [id number])  Discharge Plan: SNF    Current Diagnoses: Patient Active Problem List   Diagnosis Date Noted  . Status post total replacement of hip 03/09/2018  . Degenerative arthritis of hip 06/09/2015  . H/O total knee replacement 06/09/2015  . H/O total hip arthroplasty 06/09/2015    Orientation RESPIRATION BLADDER Height & Weight     Self, Time, Situation, Place  Normal Continent Weight: 123 lb 5 oz (55.9 kg) Height:  5' (152.4 cm)  BEHAVIORAL SYMPTOMS/MOOD NEUROLOGICAL BOWEL NUTRITION STATUS      Continent Diet(Diet: Regular )  AMBULATORY STATUS COMMUNICATION OF NEEDS Skin   Extensive Assist Verbally Surgical wounds(Incision: Right Hip. )                       Personal Care Assistance Level of Assistance  Bathing, Feeding, Dressing Bathing Assistance: Limited assistance Feeding assistance: Independent Dressing Assistance: Limited assistance     Functional Limitations Info  Sight, Hearing, Speech Sight Info: Adequate Hearing Info: Impaired Speech Info: Adequate    SPECIAL CARE FACTORS FREQUENCY  PT (By licensed PT), OT (By licensed OT)     PT Frequency: (5) OT Frequency: (5)            Contractures      Additional Factors Info  Code Status, Allergies Code Status Info: (Full Code. ) Allergies Info: (Codeine, Nsaids, Ibuprofen,  Prednisone)           Current Medications (03/10/2018):  This is the current hospital active medication list Current Facility-Administered Medications  Medication Dose Route Frequency Provider Last Rate Last Dose  . 0.9 %  sodium chloride infusion   Intravenous Continuous Donato Heinz, MD 100 mL/hr at 03/09/18 2149    . acetaminophen (OFIRMEV) IV 1,000 mg  1,000 mg Intravenous Q6H Hooten, Illene Labrador, MD 400 mL/hr at 03/10/18 0533 1,000 mg at 03/10/18 0533  . acetaminophen (TYLENOL) tablet 325-650 mg  325-650 mg Oral Q6H PRN Hooten, Illene Labrador, MD      . alum & mag hydroxide-simeth (MAALOX/MYLANTA) 200-200-20 MG/5ML suspension 30 mL  30 mL Oral Q4H PRN Hooten, Illene Labrador, MD      . amLODipine (NORVASC) tablet 5 mg  5 mg Oral Daily Hooten, Illene Labrador, MD   5 mg at 03/10/18 0853  . atorvastatin (LIPITOR) tablet 10 mg  10 mg Oral QHS Hooten, Illene Labrador, MD   10 mg at 03/09/18 2150  . bisacodyl (DULCOLAX) suppository 10 mg  10 mg Rectal Daily PRN Hooten, Illene Labrador, MD      . cholecalciferol (VITAMIN D) tablet 1,000 Units  1,000 Units Oral Daily Donato Heinz, MD   1,000 Units at 03/10/18 [phone]  . [START ON 03/11/2018] digoxin (LANOXIN) tablet 0.125 mg  0.125 mg Oral QODAY Hooten, Illene Labrador, MD      . diphenhydrAMINE (BENADRYL) 12.5 MG/5ML elixir  12.5-25 mg  12.5-25 mg Oral Q4H PRN Hooten, Illene Labrador, MD      . ferrous sulfate tablet 325 mg  325 mg Oral Daily Hooten, Illene Labrador, MD   325 mg at 03/10/18 0853  . furosemide (LASIX) tablet 20 mg  20 mg Oral Once per day on Mon Thu Hooten, James P, MD   20 mg at 03/10/18 0853  . gabapentin (NEURONTIN) capsule 300 mg  300 mg Oral QHS Hooten, Illene Labrador, MD   300 mg at 03/09/18 2150  . losartan (COZAAR) tablet 50 mg  50 mg Oral Daily Hooten, Illene Labrador, MD   50 mg at 03/10/18 1610   And  . hydrochlorothiazide (MICROZIDE) capsule 12.5 mg  12.5 mg Oral Daily Donato Heinz, MD   12.5 mg at 03/10/18 0855  . HYDROmorphone (DILAUDID) injection 0.5-1 mg  0.5-1 mg Intravenous Q4H PRN  Hooten, Illene Labrador, MD      . levothyroxine (SYNTHROID, LEVOTHROID) tablet 88 mcg  88 mcg Oral QAC breakfast Donato Heinz, MD   88 mcg at 03/10/18 0854  . magnesium hydroxide (MILK OF MAGNESIA) suspension 30 mL  30 mL Oral Daily Hooten, Illene Labrador, MD   30 mL at 03/10/18 0855  . menthol-cetylpyridinium (CEPACOL) lozenge 3 mg  1 lozenge Oral PRN Hooten, Illene Labrador, MD       Or  . phenol (CHLORASEPTIC) mouth spray 1 spray  1 spray Mouth/Throat PRN Hooten, Illene Labrador, MD      . metoCLOPramide (REGLAN) tablet 5-10 mg  5-10 mg Oral Q8H PRN Hooten, Illene Labrador, MD       Or  . metoCLOPramide (REGLAN) injection 5-10 mg  5-10 mg Intravenous Q8H PRN Hooten, Illene Labrador, MD      . metoCLOPramide (REGLAN) tablet 10 mg  10 mg Oral TID AC & HS Hooten, Illene Labrador, MD   10 mg at 03/10/18 0854  . metoprolol tartrate (LOPRESSOR) tablet 100 mg  100 mg Oral BID Donato Heinz, MD   100 mg at 03/10/18 0854  . ondansetron (ZOFRAN) tablet 4 mg  4 mg Oral Q6H PRN Hooten, Illene Labrador, MD       Or  . ondansetron (ZOFRAN) injection 4 mg  4 mg Intravenous Q6H PRN Hooten, Illene Labrador, MD      . oxyCODONE (Oxy IR/ROXICODONE) immediate release tablet 10 mg  10 mg Oral Q4H PRN Hooten, Illene Labrador, MD      . oxyCODONE (Oxy IR/ROXICODONE) immediate release tablet 5 mg  5 mg Oral Q4H PRN Hooten, Illene Labrador, MD      . pantoprazole (PROTONIX) EC tablet 40 mg  40 mg Oral BID Donato Heinz, MD   40 mg at 03/10/18 0854  . senna-docusate (Senokot-S) tablet 1 tablet  1 tablet Oral BID Donato Heinz, MD   1 tablet at 03/10/18 0854  . sodium phosphate (FLEET) 7-19 GM/118ML enema 1 enema  1 enema Rectal Once PRN Hooten, Illene Labrador, MD      . spironolactone (ALDACTONE) tablet 12.5 mg  12.5 mg Oral Once per day on Mon Thu Donato Heinz, MD   12.5 mg at 03/10/18 0854  . traMADol (ULTRAM) tablet 50-100 mg  50-100 mg Oral Q4H PRN Donato Heinz, MD   50 mg at 03/10/18 0853  . warfarin (COUMADIN) tablet 2 mg  2 mg Oral q1800 Hooten, Illene Labrador, MD      . Warfarin - Physician  Dosing Inpatient   Does not apply  [id number] Donato HeinzHooten, James P, MD         Discharge Medications: Please see discharge summary for a list of discharge medications.  Relevant Imaging Results:  Relevant Lab Results:   Additional Information (SSN: [id number])  Arlen Legendre, Darleen CrockerBailey M, LCSW

## 2018-03-10 NOTE — Progress Notes (Signed)
Physical Therapy Treatment Patient Details Name: Karen MagesColleen B Cunningham MRN: [id number] DOB: 08-11-1931 Today's Date: 03/10/2018    History of Present Illness 84yo female pt underwent R THR without reported post-op complications. PMH includes hypothyroidism, Afib, CKD, CAD, HTN, and L hip THR. PT evaluation performed POD#0.  OT evaluation performed POD#1.    PT Comments    Pt in recliner, ready to walk.  Was able to progress gait this pm to 140' but declined stair training today due to general fatigue "Tomorrow".  Daughter in law in and personal walker adjusted to fit and education regarding discharge and safety given to pt and family.  Remained in recliner after session.      Follow Up Recommendations  Home health PT Pt had [facility]PT in the past and is requesting Karen KluverKevin Cunningham with [facility] if available.   Equipment Recommendations  None recommended by PT;Other (comment)    Recommendations for Other Services       Precautions / Restrictions Precautions Precautions: Posterior Hip Precaution Booklet Issued: No Precaution Comments: Pt able to recall 1/3 at start of session, with additional instruction, pt able to recall 3/3 at end of session. Would benefit from additional education/training to support recall and carryover during ADL and mobility. Restrictions Weight Bearing Restrictions: Yes RLE Weight Bearing: Weight bearing as tolerated    Mobility  Bed Mobility Overal bed mobility: Modified Independent             General bed mobility comments: in recliner  Transfers Overall transfer level: Needs assistance Equipment used: Rolling walker (2 wheeled) Transfers: Sit to/from Stand Sit to Stand: Min guard         General transfer comment: VC for hand and foot placement to maintain precautions  Ambulation/Gait Ambulation/Gait assistance: Min guard Gait Distance (Feet): 140 Feet Assistive device: Rolling walker (2 wheeled) Gait Pattern/deviations: Step-through pattern Gait  velocity: decreased       Stairs             Wheelchair Mobility    Modified Rankin (Stroke Patients Only)       Balance Overall balance assessment: Needs assistance Sitting-balance support: No upper extremity supported Sitting balance-Leahy Scale: Good     Standing balance support: Bilateral upper extremity supported Standing balance-Leahy Scale: Fair Standing balance comment: Able to balance with bilateral UE support on walker                            Cognition Arousal/Alertness: Awake/alert Behavior During Therapy: WFL for tasks assessed/performed Overall Cognitive Status: Within Functional Limits for tasks assessed                                 General Comments: Pt endorses being "forgetful" and with a "bad memory sometimes"       Exercises Other Exercises Other Exercises: supine and seated ex for RLE ankle pumps, heel slides, ab/add, LAQ, marches x 10    General Comments        Pertinent Vitals/Pain Pain Assessment: 0-10 Pain Score: 2  Pain Location: R hip with mobility Pain Descriptors / Indicators: Aching Pain Intervention(s): Limited activity within patient's tolerance;Monitored during session    Home Living Family/patient expects to be discharged to:: Private residence Living Arrangements: Alone Available Help at Discharge: Family;Available 24 hours/day;Available PRN/intermittently(Dtr in law will be staying with her temporarily, other family nearby) Type of Home: House Home Access: Stairs to  enter Entrance Stairs-Rails: Right Home Layout: One level Home Equipment: Cane - single point;Walker - 2 wheels;Shower seat Additional Comments: [facility] access to [facility]    Prior Function Level of Independence: Needs assistance  Gait / Transfers Assistance Needed: Ambulates with single point cane and drives. No falls in the last 12 months ADL's / Homemaking Assistance Needed: Independent with ADLs, assist from family with  IADLs     PT Goals (current goals can now be found in the care plan section) Acute Rehab PT Goals Patient Stated Goal: Return to prior level of function at home Progress towards PT goals: Progressing toward goals    Frequency    BID      PT Plan Current plan remains appropriate    Co-evaluation              AM-PAC PT "6 Clicks" Daily Activity  Outcome Measure  Difficulty turning over in bed (including adjusting bedclothes, sheets and blankets)?: A Little Difficulty moving from lying on back to sitting on the side of the bed? : A Little Difficulty sitting down on and standing up from a chair with arms (e.g., wheelchair, bedside commode, etc,.)?: A Little Help needed moving to and from a bed to chair (including a wheelchair)?: A Little Help needed walking in hospital room?: A Little Help needed climbing 3-5 steps with a railing? : A Lot 6 Click Score: 17    End of Session Equipment Utilized During Treatment: Gait belt Activity Tolerance: Patient tolerated treatment well Patient left: in chair;with chair alarm set;with call bell/phone within reach;with family/visitor present Nurse Communication: Mobility status Pain - Right/Left: Right Pain - part of body: Hip     Time: 5409-8119 PT Time Calculation (min) (ACUTE ONLY): 10 min  Charges:  $Gait Training: 8-22 mins $Therapeutic Exercise: 8-22 mins                    G Codes:       Danielle Dess, PTA 03/10/18, 1:45 PM

## 2018-03-10 NOTE — Progress Notes (Signed)
Physical Therapy Treatment Patient Details Name: Karen MagesColleen B Cunningham MRN: [id number] DOB: [birth year] Today's Date: 03/10/2018    History of Present Illness 84yo female pt underwent R THR without reported post-op complications. PMH includes hypothyroidism, Afib, CKD, CAD, HTN, and L hip THR. PT evaluation performed POD#0.  OT evaluation performed POD#1.    PT Comments    In recliner, ready for session.  Stood with min guard and was able to ambulate around bed to bathroom with min guard/assist.  Voided with ease then was able to ambulate into hallway for an additional 50'.  Fatigued upon return to chair.  Participated in exercises as described below.  Pt will have daughter in law stay with her upon discharge.  Progressing well.   Follow Up Recommendations  Home health PT     Equipment Recommendations  None recommended by PT;Other (comment)    Recommendations for Other Services       Precautions / Restrictions Precautions Precautions: Posterior Hip Precaution Booklet Issued: No Precaution Comments: Pt able to recall 1/3 at start of session, with additional instruction, pt able to recall 3/3 at end of session. Would benefit from additional education/training to support recall and carryover during ADL and mobility. Restrictions Weight Bearing Restrictions: Yes RLE Weight Bearing: Weight bearing as tolerated    Mobility  Bed Mobility Overal bed mobility: Modified Independent             General bed mobility comments: in recliner  Transfers Overall transfer level: Needs assistance Equipment used: Rolling walker (2 wheeled) Transfers: Sit to/from Stand Sit to Stand: Min guard         General transfer comment: VC for hand and foot placement to maintain precautions  Ambulation/Gait Ambulation/Gait assistance: Min guard Gait Distance (Feet): 40 Feet Assistive device: Rolling walker (2 wheeled) Gait Pattern/deviations: Step-through pattern Gait velocity: decreased        Stairs             Wheelchair Mobility    Modified Rankin (Stroke Patients Only)       Balance Overall balance assessment: Needs assistance Sitting-balance support: No upper extremity supported Sitting balance-Leahy Scale: Good     Standing balance support: Bilateral upper extremity supported Standing balance-Leahy Scale: Fair Standing balance comment: Able to balance with bilateral UE support on walker                            Cognition Arousal/Alertness: Awake/alert Behavior During Therapy: WFL for tasks assessed/performed Overall Cognitive Status: History of cognitive impairments - at baseline                                 General Comments: Pt endorses being "forgetful" and with a "bad memory sometimes"       Exercises Other Exercises Other Exercises: supine and seated ex for RLE ankle pumps, heel slides, ab/add, LAQ, marches x 10    General Comments        Pertinent Vitals/Pain Pain Assessment: 0-10 Pain Score: 2  Pain Location: R hip with mobility Pain Descriptors / Indicators: Aching Pain Intervention(s): Monitored during session    Home Living Family/patient expects to be discharged to:: Private residence Living Arrangements: Alone Available Help at Discharge: Family;Available 24 hours/day;Available PRN/intermittently(Dtr in law will be staying with her temporarily, other family nearby) Type of Home: House Home Access: Stairs to enter Entrance Stairs-Rails: Right Home Layout: One level  Home Equipment: Gilmer Mor - single point;Walker - 2 wheels;Shower seat Additional Comments: possible access to [facility]    Prior Function Level of Independence: Needs assistance  Gait / Transfers Assistance Needed: Ambulates with single point cane and drives. No falls in the last 12 months ADL's / Homemaking Assistance Needed: Independent with ADLs, assist from family with IADLs     PT Goals (current goals can now be found in the care  plan section) Acute Rehab PT Goals Patient Stated Goal: Return to prior level of function at home Progress towards PT goals: Progressing toward goals    Frequency    BID      PT Plan Current plan remains appropriate    Co-evaluation              AM-PAC PT "6 Clicks" Daily Activity  Outcome Measure  Difficulty turning over in bed (including adjusting bedclothes, sheets and blankets)?: A Little Difficulty moving from lying on back to sitting on the side of the bed? : A Little Difficulty sitting down on and standing up from a chair with arms (e.g., wheelchair, bedside commode, etc,.)?: A Little Help needed moving to and from a bed to chair (including a wheelchair)?: A Little Help needed walking in hospital room?: A Little Help needed climbing 3-5 steps with a railing? : A Lot 6 Click Score: 17    End of Session Equipment Utilized During Treatment: Gait belt Activity Tolerance: Patient tolerated treatment well Patient left: in chair;with chair alarm set;with call bell/phone within reach Nurse Communication: Mobility status Pain - Right/Left: Right Pain - part of body: Hip     Time: 1107-1130 PT Time Calculation (min) (ACUTE ONLY): 23 min  Charges:  $Gait Training: 8-22 mins $Therapeutic Exercise: 8-22 mins                    G Codes:       Danielle Dess, PTA 03/10/18, 12:26 PM

## 2018-03-11 ENCOUNTER — Encounter: Payer: Self-pay | Admitting: Orthopedic Surgery

## 2018-03-11 LAB — CBC
HEMATOCRIT: 31.4 % — AB (ref 35.0–47.0)
Hemoglobin: 10.5 g/dL — ABNORMAL LOW (ref 12.0–16.0)
MCH: 29.7 pg (ref 26.0–34.0)
MCHC: 33.5 g/dL (ref 32.0–36.0)
MCV: 88.6 fL (ref 80.0–100.0)
Platelets: 174 10*3/uL (ref 150–440)
RBC: 3.55 MIL/uL — ABNORMAL LOW (ref 3.80–5.20)
RDW: 14.3 % (ref 11.5–14.5)
WBC: 13.3 10*3/uL — ABNORMAL HIGH (ref 3.6–11.0)

## 2018-03-11 LAB — BASIC METABOLIC PANEL
Anion gap: 8 (ref 5–15)
BUN: 17 mg/dL (ref 6–20)
CALCIUM: 8.3 mg/dL — AB (ref 8.9–10.3)
CO2: 23 mmol/L (ref 22–32)
CREATININE: 0.82 mg/dL (ref 0.44–1.00)
Chloride: 108 mmol/L (ref 101–111)
GFR calc Af Amer: >60 mL/min (ref >60)
GFR calc non Af Amer: >60 mL/min (ref >60)
GLUCOSE: 114 mg/dL — AB (ref 65–99)
Potassium: 3.3 mmol/L — ABNORMAL LOW (ref 3.5–5.1)
Sodium: 139 mmol/L (ref 135–145)

## 2018-03-11 LAB — PROTIME-INR
INR: 1.07
Prothrombin Time: 13.8 seconds (ref 11.4–15.2)

## 2018-03-11 LAB — SURGICAL PATHOLOGY

## 2018-03-11 MED ORDER — POTASSIUM CHLORIDE 20 MEQ PO PACK
40.0000 meq | PACK | Freq: Once | ORAL | Status: AC
  Administered 2018-03-11: 40 meq via ORAL
  Filled 2018-03-11: qty 2

## 2018-03-11 MED ORDER — OXYCODONE HCL 5 MG PO TABS: 5.0000 mg | ORAL_TABLET | ORAL | 0 refills | Status: DC | PRN

## 2018-03-11 NOTE — Progress Notes (Signed)
  [facility]. Changed wound vac over to Prevena. DC & RX instructions given and patient acknowledged understanding. Patient is aware of taking 4mg  Warfarin tonight only and then start on regular dosing schedule.

## 2018-03-11 NOTE — Progress Notes (Signed)
Physical Therapy Treatment Patient Details Name: Karen Cunningham MRN: [id number] DOB: [birth year] Today's Date: 03/11/2018    History of Present Illness 84yo female pt underwent R THR without reported post-op complications. PMH includes hypothyroidism, Afib, CKD, CAD, HTN, and L hip THR.    PT Comments    Pt demonstrates improving physical ability and function demonstrated through decreased assistance needed with increasing difficulty of tasks. Pt CGA for transfers, gait, and stair training. Pt displays ability to perform there-ex safely with verbal cuing, pt given and reviewed HEP packet. Pt able to amb' 200' using RW and continued verbal cuing for posture throughout. Pt able to ascend and descend 4 stairs with CGA after education and demonstration indicating ability to safely enter and exit home. Pt unable to recall posterior total hip precautions at beginning of session or after continued education. Pt does not place self in compromising positions throughout session. Pt given packet about posterior total hip precautions. Pt educated on safety with car transfers including review of handout. Pt has met all PT goals at this time. Pt could benefit from continued skilled therapy to improve deficits towards PLOF. PT believes that pt is safe to d/c home with home health PT largely due to pts large family support in home.   Follow Up Recommendations  Home health PT     Equipment Recommendations  None recommended by PT    Recommendations for Other Services       Precautions / Restrictions Precautions Precautions: Posterior Hip Precaution Booklet Issued: Yes (comment)(HEP with precautions) Precaution Comments: pt unable to recall precautions at beginning of session. Even after education throughout session pt unable to recall without cuing at end of session. Restrictions Weight Bearing Restrictions: Yes RLE Weight Bearing: Weight bearing as tolerated    Mobility  Bed Mobility                General bed mobility comments: not assessed this date to pt being up in chair on arrival and prefering to stay up in chair at end of session.  Transfers Overall transfer level: Needs assistance Equipment used: Rolling walker (2 wheeled) Transfers: Sit to/from Stand Sit to Stand: Min guard         General transfer comment: verbal and tactile cuing to maintain posterior hip precautions during transfer. Pt performs safely.  Ambulation/Gait Ambulation/Gait assistance: Min guard Gait Distance (Feet): 200 Feet Assistive device: Rolling walker (2 wheeled) Gait Pattern/deviations: Step-through pattern     General Gait Details: pt demonstrates adequate step though gait pattern and safe use of RW however requires continued verbal cuing for upright posture during amb.   Stairs Stairs: Yes Stairs assistance: Min guard Stair Management: One rail Right Number of Stairs: 4 General stair comments: pt able to safely ascend and descend 4 stairs with one rail on R. Pt educated verbally and through cuing how to safely perform. No gross LOB during stair training. pt verbalizes that she feels safe to do this to enter and exit home enviornment   Wheelchair Mobility    Modified Rankin (Stroke Patients Only)       Balance                                            Cognition Arousal/Alertness: Awake/alert Behavior During Therapy: WFL for tasks assessed/performed Overall Cognitive Status: Within Functional Limits for tasks assessed  General Comments: Pt endorses being "forgetful" and with a "bad memory sometimes." This evident through inability to recall precautions for posterior total hip      Exercises Other Exercises Other Exercises: seated ther-ex for RLE ankle pumps, glut sets, SLR, heel slides, ab/add, LAQ, marches, hip add isometrics x 15. Pt performs safely with verbal cuing and no physical assistance. HEP packet  given and reviewed    General Comments        Pertinent Vitals/Pain Pain Assessment: No/denies pain Pain Score: 0-No pain Pain Location: R hip with mobility Pain Intervention(s): Limited activity within patient's tolerance;Monitored during session;Repositioned    Home Living                      Prior Function            PT Goals (current goals can now be found in the care plan section) Acute Rehab PT Goals Patient Stated Goal: Return to prior level of function at home PT Goal Formulation: With patient/family Time For Goal Achievement: 03/23/18 Potential to Achieve Goals: Good Progress towards PT goals: Progressing toward goals    Frequency    BID      PT Plan Current plan remains appropriate    Co-evaluation              AM-PAC PT "6 Clicks" Daily Activity  Outcome Measure  Difficulty turning over in bed (including adjusting bedclothes, sheets and blankets)?: A Little Difficulty moving from lying on back to sitting on the side of the bed? : A Little Difficulty sitting down on and standing up from a chair with arms (e.g., wheelchair, bedside commode, etc,.)?: A Little Help needed moving to and from a bed to chair (including a wheelchair)?: A Little Help needed walking in hospital room?: A Little Help needed climbing 3-5 steps with a railing? : A Little 6 Click Score: 18    End of Session Equipment Utilized During Treatment: Gait belt Activity Tolerance: Patient tolerated treatment well Patient left: in chair;with chair alarm set;with call bell/phone within reach;with family/visitor present Nurse Communication: Mobility status;Other (comment)(d/c planning) PT Visit Diagnosis: Unsteadiness on feet (R26.81);Muscle weakness (generalized) (M62.81);Pain Pain - Right/Left: Right Pain - part of body: Hip     Time: 0940-1008 PT Time Calculation (min) (ACUTE ONLY): 28 min  Charges:                       G Codes:       Aida Lemaire Marylu Lund,  SPT    Durand Wittmeyer 03/11/2018, 11:47 AM

## 2018-03-11 NOTE — Care Management Note (Signed)
Case Management Note  Patient Details  Name: Karen Cunningham MRN: [id number] Date of Birth: 1931-09-07  Subjective/Objective:  Discharging today                  Action/Plan: [facility] made aware of discharge. Will need. PT, RN and OT. RN to draw INR on Monday. [facility] made aware.   Expected Discharge Date:  03/11/18               Expected Discharge Plan:  Home w Home Health Services  In-House Referral:     Discharge planning Services  CM Consult  Post Acute Care Choice:  Home Health Choice offered to:  Patient, Adult Children  DME Arranged:    DME Agency:     HH Arranged:  PT, OT HH Agency:  [facility] ([facility]  Status of Service:  Completed, signed off  If discussed at [employer] Meetings, dates discussed:    Additional Comments:  Marily MemosLisa M Jameah Rouser, RN 03/11/2018, 10:55 AM

## 2018-03-11 NOTE — Discharge Instructions (Signed)
Instructions after Total Hip Replacement     Karen Molyneux P. Angie FavaHooten, Jr., M.D.     Dept. of Orthopaedics & Sports Medicine  [facility]  [address]  [place], [place]  [address]  Phone: [phone]   Fax: [phone]    DIET:  Drink plenty of non-alcoholic fluids.  Resume your normal diet. Include foods high in fiber.  ACTIVITY:   You may use crutches or a walker with weight-bearing as tolerated, unless instructed otherwise.  You may be weaned off of the walker or crutches by your Physical Therapist.   Do NOT reach below the level of your knees or cross your legs until allowed.     Continue doing gentle exercises. Exercising will reduce the pain and swelling, increase motion, and prevent muscle weakness.    Please continue to use the TED compression stockings for 6 weeks. You may remove the stockings at night, but should reapply them in the morning.  Do not drive or operate any equipment until instructed.  WOUND CARE:   Continue to use ice packs periodically to reduce pain and swelling.  Keep the incision clean and dry.  You may bathe or shower after the staples are removed at the first office visit following surgery.  MEDICATIONS:  You may resume your regular medications.  Please take the pain medication as prescribed on the medication.  Do not take pain medication on an empty stomach.  You have been given a prescription for a blood thinner to prevent blood clots. Please take the medication as instructed. (NOTE: After completing a 2 week course of Lovenox, take one Enteric-coated aspirin once a day.)  Pain medications and iron supplements can cause constipation. Use a stool softener (Senokot or Colace) on a daily basis and a laxative (dulcolax or miralax) as needed.  Do not drive or drink alcoholic beverages when taking pain medications.  CALL THE OFFICE FOR:  Temperature above 101 degrees  Excessive bleeding or drainage on the dressing.  Excessive  swelling, coldness, or paleness of the toes.  Persistent nausea and vomiting.  FOLLOW-UP:   You should have an appointment to return to the office in 6 weeks after surgery.  Arrangements have been made for continuation of Physical Therapy (either home therapy or outpatient therapy).  On 03/11/18 when returning home take 4mg  of Coumadin and then return back to normal dosing on 03/12/18.

## 2018-03-11 NOTE — Progress Notes (Addendum)
Subjective: 2 Days Post-Op Procedure(s) (LRB): TOTAL HIP ARTHROPLASTY (Right) Patient reports pain as mild.   Patient is well, and has had no acute complaints or problems Plan is to go Home after hospital stay. Negative for chest pain and shortness of breath Fever: no Gastrointestinal:Negative for nausea and vomiting  Objective: Vital signs in last 24 hours: Temp:  [97.5 F (36.4 C)-98 F (36.7 C)] 98 F (36.7 C) (06/21 0037) Pulse Rate:  [41-98] 96 (06/21 0037) Resp:  [16-18] 16 (06/21 0037) BP: (104-132)/(56-73) 106/73 (06/21 0037) SpO2:  [92 %-94 %] 93 % (06/21 0037)  Intake/Output from previous day:  Intake/Output Summary (Last 24 hours) at 03/11/2018 0751 Last data filed at 03/10/2018 2234 Gross per 24 hour  Intake 720 ml  Output 120 ml  Net 600 ml    Intake/Output this shift: No intake/output data recorded.  Labs: Recent Labs    03/09/18 0655 03/10/18 0323 03/11/18 0357  HGB 12.6 10.8* 10.5*   Recent Labs    03/10/18 0323 03/11/18 0357  WBC 11.4* 13.3*  RBC 3.57* 3.55*  HCT 31.6* 31.4*  PLT 185 174   Recent Labs    03/10/18 0323 03/11/18 0357  NA 134* 139  K 3.9 3.3*  CL 104 108  CO2 22 23  BUN 18 17  CREATININE 0.99 0.82  GLUCOSE 164* 114*  CALCIUM 8.5* 8.3*   Recent Labs    03/10/18 0323 03/11/18 0357  INR 1.11 1.07     EXAM General - Patient is Alert, Appropriate and Oriented Extremity - ABD soft Neurovascular intact Sensation intact distally Intact pulses distally Dorsiflexion/Plantar flexion intact Incision: dressing C/D/I No cellulitis present Dressing/Incision - clean, dry, no drainage, hemovac removed today. Motor Function - intact, moving foot and toes well on exam.  Abdomen soft with normals BS.  Past Medical History:  Diagnosis Date  . Anemia   . Arthritis   . Atrial fibrillation (HCC)   . Chronic kidney disease   . Coronary artery disease   . GERD (gastroesophageal reflux disease)   . HLD (hyperlipidemia)   .  Hypertension   . Hypothyroidism    Assessment/Plan: 2 Days Post-Op Procedure(s) (LRB): TOTAL HIP ARTHROPLASTY (Right) Active Problems:   Status post total replacement of hip  Estimated body mass index is 24.08 kg/m as calculated from the following:   Height as of this encounter: 5' (1.524 m).   Weight as of this encounter: 55.9 kg (123 lb 5 oz). Advance diet Up with therapy D/C IV fluids when tolerating po intake.  Labs reviewed, INR 1.07 this AM, will have the patient take 4mg  this evening following discharge and then return to her normal coumadin dosing in addition to continuing Digoxin. Will have the patient receive PT/INR checks with HHPT, starting on Monday. Hemovac removed today from the right hip this morning. Up with therapy today will work on stairs this AM. Plan will be for discharge to home following working with PT today.  DVT Prophylaxis - Coumadin, Foot Pumps and TED hose Weight-Bearing as tolerated to right leg  J. Horris LatinoLance Altie Savard, PA-C [facility] 03/11/2018, 7:51 AM

## 2018-03-11 NOTE — Discharge Summary (Signed)
Physician Discharge Summary  Patient ID: Karen Cunningham MRN: [id number] DOB/AGE: 82-Aug-1932 82 y.o.  Admit date: 03/09/2018 Discharge date: 03/11/2018  Admission Diagnoses:  primary osteoarthritis of right hip  Discharge Diagnoses: Patient Active Problem List   Diagnosis Date Noted  . Status post total replacement of hip 03/09/2018  . Degenerative arthritis of hip 06/09/2015  . H/O total knee replacement 06/09/2015  . H/O total hip arthroplasty 06/09/2015    Past Medical History:  Diagnosis Date  . Anemia   . Arthritis   . Atrial fibrillation (HCC)   . Chronic kidney disease   . Coronary artery disease   . GERD (gastroesophageal reflux disease)   . HLD (hyperlipidemia)   . Hypertension   . Hypothyroidism      Transfusion: None.   Consultants (if any):   Discharged Condition: Improved  Hospital Course: Karen Cunningham is an 84 y.o. female who was admitted 03/09/2018 with a diagnosis of primary osteoarthritis of the right hip and went to the operating room on 03/09/2018 and underwent the above named procedures.    Surgeries: Procedure(s): TOTAL HIP ARTHROPLASTY on 03/09/2018 Patient tolerated the surgery well. Taken to PACU where she was stabilized and then transferred to the orthopedic floor.  Patient was continued on her 2mg  daily dose of Coumadin, PT/INR 1.04 on 03/11/18 and continued on Digoxin. Foot pumps applied bilaterally at 80 mm. Heels elevated on bed with rolled towels. No evidence of DVT. Negative Homan. Physical therapy started on day #1 for gait training and transfer. OT started day #1 for ADL and assisted devices.  Patient's IV and foley were removed on POD1, Hemovac drain removed on POD2.  Implants: DePuy 12 mm small stature AML femoral stem, 52 mm OD Pinnacle 100 acetabular component, +4 mm neutral Pinnacle Marathon polyethylene insert, and a 36 mm M-SPEC -2 mm hip ball.  She was given perioperative antibiotics:  Anti-infectives (From admission, onward)    Start     Dose/Rate Route Frequency Ordered Stop   03/09/18 1400  ceFAZolin (ANCEF) IVPB 2g/100 mL premix     2 g 200 mL/hr over 30 Minutes Intravenous Every 6 hours 03/09/18 1201 03/10/18 0925   03/09/18 0611  ceFAZolin (ANCEF) 2-4 GM/100ML-% IVPB    Note to Pharmacy:  Rayann Heman   : cabinet override      03/09/18 0611 03/09/18 1829   03/09/18 0600  ceFAZolin (ANCEF) IVPB 2g/100 mL premix  Status:  Discontinued     2 g 200 mL/hr over 30 Minutes Intravenous On call to O.R. 03/08/18 2317 03/09/18 0640    .  She was given sequential compression devices, early ambulation, and Coumadin for DVT prophylaxis.  She benefited maximally from the hospital stay and there were no complications.    Recent vital signs:  Vitals:   03/11/18 0037 03/11/18 0823  BP: 106/73 126/73  Pulse: 96 87  Resp: 16   Temp: 98 F (36.7 C) 98.7 F (37.1 C)  SpO2: 93% 96%    Recent laboratory studies:  Lab Results  Component Value Date   HGB 10.5 (L) 03/11/2018   HGB 10.8 (L) 03/10/2018   HGB 12.6 03/09/2018   Lab Results  Component Value Date   WBC 13.3 (H) 03/11/2018   PLT 174 03/11/2018   Lab Results  Component Value Date   INR 1.07 03/11/2018   Lab Results  Component Value Date   NA 139 03/11/2018   K 3.3 (L) 03/11/2018   CL 108 03/11/2018   CO2  23 03/11/2018   BUN 17 03/11/2018   CREATININE 0.82 03/11/2018   GLUCOSE 114 (H) 03/11/2018    Discharge Medications:   Allergies as of 03/11/2018      Reactions   Codeine Nausea And Vomiting   Nsaids    Dr instructed to avoid due to kidney issues    Ibuprofen Other (See Comments)   Dr instructed to avoid due to kidney issues    Prednisone Other (See Comments)   ELEVATED BLOOD PRESSURE.      Medication List    TAKE these medications   acetaminophen 500 MG tablet Commonly known as:  TYLENOL Take 500 mg by mouth every 6 (six) hours as needed for moderate pain (pain).   amLODipine 5 MG tablet Commonly known as:   NORVASC Take 5 mg by mouth daily.   aspirin EC 81 MG tablet Take 81 mg by mouth daily.   atorvastatin 10 MG tablet Commonly known as:  LIPITOR Take 10 mg by mouth at bedtime.   digoxin 0.125 MG tablet Commonly known as:  LANOXIN Take 0.125 mg by mouth every other day.   furosemide 20 MG tablet Commonly known as:  LASIX Take 20 mg by mouth 2 (two) times a week. Take on Mondays and Thursdays   Iron 325 (65 Fe) MG Tabs Take 325 mg by mouth daily.   levothyroxine 88 MCG tablet Commonly known as:  SYNTHROID, LEVOTHROID Take 88 mcg by mouth daily before breakfast.   losartan-hydrochlorothiazide 50-12.5 MG tablet Commonly known as:  HYZAAR Take 1 tablet by mouth daily.   Melatonin 10 MG Tabs Take 10 mg by mouth at bedtime.   metoprolol tartrate 100 MG tablet Commonly known as:  LOPRESSOR Take 100 mg by mouth 2 (two) times daily.   oxyCODONE 5 MG immediate release tablet Commonly known as:  Oxy IR/ROXICODONE Take 1-2 tablets (5-10 mg total) by mouth every 4 (four) hours as needed for moderate pain (pain score 4-6).   ranitidine 150 MG capsule Commonly known as:  ZANTAC Take 150 mg by mouth 2 (two) times daily.   spironolactone 25 MG tablet Commonly known as:  ALDACTONE Take 12.5 mg by mouth 2 (two) times a week. Take Mondays and Thursdays   Vitamin D3 1000 units Caps Take 1,000 Units by mouth daily.   warfarin 2 MG tablet Commonly known as:  COUMADIN Take 2 mg by mouth daily.            Durable Medical Equipment  (From admission, onward)        Start     Ordered   03/09/18 1202  DME Walker rolling  Once    Question:  Patient needs a walker to treat with the following condition  Answer:  S/P total hip arthroplasty   03/09/18 1201   03/09/18 1202  DME Bedside commode  Once    Question:  Patient needs a bedside commode to treat with the following condition  Answer:  S/P total hip arthroplasty   03/09/18 1201      Diagnostic Studies: Dg Hip Port Unilat  With Pelvis 1v Right  Result Date: 03/09/2018 CLINICAL DATA:  Status post right hip arthroplasty EXAM: DG HIP (WITH OR WITHOUT PELVIS) 2V PORT RIGHT COMPARISON:  None. FINDINGS: Right hip replacement is now seen. Two surgical drains are noted in place. Old hip replacement on the left is noted. The pelvic ring is intact. No acute bony abnormality is seen. IMPRESSION: Status post right hip replacement Electronically Signed   By:  Alcide Clever M.D.   On: 03/09/2018 11:18   Disposition: Plan will be for discharge home today with HHPT.  HHPT to re-check PT/INR on Monday 03/14/18.  Upon discharge home will take 4mg  of Coumadin overall on 03/11/18.    Follow-up Information    Hooten, Illene Labrador, MD On 04/21/2018.   Specialty:  Orthopedic Surgery Why:  at 1:30pm Contact information: [address] [facility] [place] [address] [phone]          Signed: Meriel Pica PA-C 03/11/2018, 10:40 AM

## 2018-04-15 ENCOUNTER — Ambulatory Visit: Payer: Medicare Other | Admitting: Podiatry

## 2018-04-26 ENCOUNTER — Ambulatory Visit: Payer: Medicare Other | Admitting: Podiatry

## 2018-05-10 ENCOUNTER — Encounter: Payer: Self-pay | Admitting: Podiatry

## 2018-05-10 ENCOUNTER — Ambulatory Visit (INDEPENDENT_AMBULATORY_CARE_PROVIDER_SITE_OTHER): Payer: Medicare Other | Admitting: Podiatry

## 2018-05-10 DIAGNOSIS — M79676 Pain in unspecified toe(s): Secondary | ICD-10-CM | POA: Diagnosis not present

## 2018-05-10 DIAGNOSIS — B351 Tinea unguium: Secondary | ICD-10-CM

## 2018-05-10 DIAGNOSIS — M79609 Pain in unspecified limb: Principal | ICD-10-CM

## 2018-05-12 NOTE — Progress Notes (Signed)
   SUBJECTIVE Patient presents to office today complaining of elongated, thickened nails that cause pain while ambulating in shoes. She is unable to trim her own nails. Patient is here for further evaluation and treatment.  Past Medical History:  Diagnosis Date  . Anemia   . Arthritis   . Atrial fibrillation (HCC)   . Chronic kidney disease   . Coronary artery disease   . GERD (gastroesophageal reflux disease)   . HLD (hyperlipidemia)   . Hypertension   . Hypothyroidism     OBJECTIVE General Patient is awake, alert, and oriented x 3 and in no acute distress. Derm Skin is dry and supple bilateral. Negative open lesions or macerations. Remaining integument unremarkable. Nails are tender, long, thickened and dystrophic with subungual debris, consistent with onychomycosis, 1-5 bilateral. No signs of infection noted. Vasc  DP and PT pedal pulses palpable bilaterally. Temperature gradient within normal limits.  Neuro Epicritic and protective threshold sensation grossly intact bilaterally.  Musculoskeletal Exam No symptomatic pedal deformities noted bilateral. Muscular strength within normal limits.  ASSESSMENT 1. Onychodystrophic nails 1-5 bilateral with hyperkeratosis of nails.  2. Onychomycosis of nail due to dermatophyte bilateral 3. Pain in foot bilateral  PLAN OF CARE 1. Patient evaluated today.  2. Instructed to maintain good pedal hygiene and foot care.  3. Mechanical debridement of nails 1-5 bilaterally performed using a nail nipper. Filed with dremel without incident.  4. Return to clinic in 3 mos.    Brent M. Evans, DPM [facility]  Dr. Brent M. Evans, DPM    [address]                                        [place], [place] [address]                Office [phone]  Fax [phone]     

## 2018-08-12 ENCOUNTER — Encounter: Payer: Self-pay | Admitting: Podiatry

## 2018-08-12 ENCOUNTER — Ambulatory Visit (INDEPENDENT_AMBULATORY_CARE_PROVIDER_SITE_OTHER): Payer: Medicare Other | Admitting: Podiatry

## 2018-08-12 DIAGNOSIS — B351 Tinea unguium: Secondary | ICD-10-CM | POA: Diagnosis not present

## 2018-08-12 DIAGNOSIS — M79676 Pain in unspecified toe(s): Secondary | ICD-10-CM

## 2018-08-12 DIAGNOSIS — M79609 Pain in unspecified limb: Principal | ICD-10-CM

## 2018-08-15 NOTE — Progress Notes (Signed)
   SUBJECTIVE Patient presents to office today complaining of elongated, thickened nails that cause pain while ambulating in shoes. She is unable to trim her own nails. Patient is here for further evaluation and treatment.  Past Medical History:  Diagnosis Date  . Anemia   . Arthritis   . Atrial fibrillation (HCC)   . Chronic kidney disease   . Coronary artery disease   . GERD (gastroesophageal reflux disease)   . HLD (hyperlipidemia)   . Hypertension   . Hypothyroidism     OBJECTIVE General Patient is awake, alert, and oriented x 3 and in no acute distress. Derm Skin is dry and supple bilateral. Negative open lesions or macerations. Remaining integument unremarkable. Nails are tender, long, thickened and dystrophic with subungual debris, consistent with onychomycosis, 1-5 bilateral. No signs of infection noted. Vasc  DP and PT pedal pulses palpable bilaterally. Temperature gradient within normal limits.  Neuro Epicritic and protective threshold sensation grossly intact bilaterally.  Musculoskeletal Exam No symptomatic pedal deformities noted bilateral. Muscular strength within normal limits.  ASSESSMENT 1. Onychodystrophic nails 1-5 bilateral with hyperkeratosis of nails.  2. Onychomycosis of nail due to dermatophyte bilateral 3. Pain in foot bilateral  PLAN OF CARE 1. Patient evaluated today.  2. Instructed to maintain good pedal hygiene and foot care.  3. Mechanical debridement of nails 1-5 bilaterally performed using a nail nipper. Filed with dremel without incident.  4. Return to clinic in 3 mos.     M. , DPM [facility]  Dr.  M. , DPM    [address]                                        , [place] [address]                Office [phone]  Fax [phone]     

## 2018-09-26 ENCOUNTER — Other Ambulatory Visit: Payer: Self-pay

## 2018-09-26 ENCOUNTER — Emergency Department: Payer: Medicare Other

## 2018-09-26 ENCOUNTER — Encounter: Payer: Self-pay | Admitting: Emergency Medicine

## 2018-09-26 ENCOUNTER — Inpatient Hospital Stay
Admission: EM | Admit: 2018-09-26 | Discharge: 2018-09-29 | DRG: 184 | Disposition: A | Discharge status: Home / Self Care | Payer: Medicare Other | Attending: Internal Medicine | Admitting: Internal Medicine

## 2018-09-26 DIAGNOSIS — Y92410 Unspecified street and highway as the place of occurrence of the external cause: Secondary | ICD-10-CM

## 2018-09-26 DIAGNOSIS — Z7901 Long term (current) use of anticoagulants: Secondary | ICD-10-CM

## 2018-09-26 DIAGNOSIS — Z79891 Long term (current) use of opiate analgesic: Secondary | ICD-10-CM | POA: Diagnosis not present

## 2018-09-26 DIAGNOSIS — Z885 Allergy status to narcotic agent status: Secondary | ICD-10-CM

## 2018-09-26 DIAGNOSIS — K219 Gastro-esophageal reflux disease without esophagitis: Secondary | ICD-10-CM | POA: Diagnosis present

## 2018-09-26 DIAGNOSIS — D649 Anemia, unspecified: Secondary | ICD-10-CM | POA: Diagnosis present

## 2018-09-26 DIAGNOSIS — Z7982 Long term (current) use of aspirin: Secondary | ICD-10-CM

## 2018-09-26 DIAGNOSIS — Z888 Allergy status to other drugs, medicaments and biological substances status: Secondary | ICD-10-CM

## 2018-09-26 DIAGNOSIS — N189 Chronic kidney disease, unspecified: Secondary | ICD-10-CM | POA: Diagnosis present

## 2018-09-26 DIAGNOSIS — Z955 Presence of coronary angioplasty implant and graft: Secondary | ICD-10-CM | POA: Diagnosis not present

## 2018-09-26 DIAGNOSIS — Z952 Presence of prosthetic heart valve: Secondary | ICD-10-CM

## 2018-09-26 DIAGNOSIS — Z96641 Presence of right artificial hip joint: Secondary | ICD-10-CM | POA: Diagnosis present

## 2018-09-26 DIAGNOSIS — Z79890 Hormone replacement therapy (postmenopausal): Secondary | ICD-10-CM | POA: Diagnosis not present

## 2018-09-26 DIAGNOSIS — S27329A Contusion of lung, unspecified, initial encounter: Secondary | ICD-10-CM | POA: Diagnosis present

## 2018-09-26 DIAGNOSIS — Z9089 Acquired absence of other organs: Secondary | ICD-10-CM | POA: Diagnosis not present

## 2018-09-26 DIAGNOSIS — E785 Hyperlipidemia, unspecified: Secondary | ICD-10-CM | POA: Diagnosis present

## 2018-09-26 DIAGNOSIS — I350 Nonrheumatic aortic (valve) stenosis: Secondary | ICD-10-CM | POA: Diagnosis present

## 2018-09-26 DIAGNOSIS — S2242XA Multiple fractures of ribs, left side, initial encounter for closed fracture: Secondary | ICD-10-CM | POA: Diagnosis not present

## 2018-09-26 DIAGNOSIS — I2510 Atherosclerotic heart disease of native coronary artery without angina pectoris: Secondary | ICD-10-CM | POA: Diagnosis present

## 2018-09-26 DIAGNOSIS — W228XXA Striking against or struck by other objects, initial encounter: Secondary | ICD-10-CM | POA: Diagnosis present

## 2018-09-26 DIAGNOSIS — I4891 Unspecified atrial fibrillation: Secondary | ICD-10-CM | POA: Diagnosis present

## 2018-09-26 DIAGNOSIS — Z90710 Acquired absence of both cervix and uterus: Secondary | ICD-10-CM | POA: Diagnosis not present

## 2018-09-26 DIAGNOSIS — I129 Hypertensive chronic kidney disease with stage 1 through stage 4 chronic kidney disease, or unspecified chronic kidney disease: Secondary | ICD-10-CM | POA: Diagnosis present

## 2018-09-26 DIAGNOSIS — E039 Hypothyroidism, unspecified: Secondary | ICD-10-CM | POA: Diagnosis present

## 2018-09-26 DIAGNOSIS — I4821 Permanent atrial fibrillation: Secondary | ICD-10-CM | POA: Diagnosis present

## 2018-09-26 DIAGNOSIS — Z96642 Presence of left artificial hip joint: Secondary | ICD-10-CM | POA: Diagnosis present

## 2018-09-26 DIAGNOSIS — Z96653 Presence of artificial knee joint, bilateral: Secondary | ICD-10-CM | POA: Diagnosis present

## 2018-09-26 DIAGNOSIS — I482 Chronic atrial fibrillation, unspecified: Secondary | ICD-10-CM | POA: Diagnosis present

## 2018-09-26 LAB — CBC
HCT: 43.6 % (ref 36.0–46.0)
Hemoglobin: 13.8 g/dL (ref 12.0–15.0)
MCH: 28.6 pg (ref 26.0–34.0)
MCHC: 31.7 g/dL (ref 30.0–36.0)
MCV: 90.3 fL (ref 80.0–100.0)
NRBC: 0 % (ref 0.0–0.2)
PLATELETS: 223 10*3/uL (ref 150–400)
RBC: 4.83 MIL/uL (ref 3.87–5.11)
RDW: 13.9 % (ref 11.5–15.5)
WBC: 10 10*3/uL (ref 4.0–10.5)

## 2018-09-26 LAB — BASIC METABOLIC PANEL
ANION GAP: 14 (ref 5–15)
BUN: 22 mg/dL (ref 8–23)
CALCIUM: 9.4 mg/dL (ref 8.9–10.3)
CO2: 21 mmol/L — ABNORMAL LOW (ref 22–32)
Chloride: 105 mmol/L (ref 98–111)
Creatinine, Ser: 1.04 mg/dL — ABNORMAL HIGH (ref 0.44–1.00)
GFR, EST AFRICAN AMERICAN: 57 mL/min — AB (ref >60)
GFR, EST NON AFRICAN AMERICAN: 49 mL/min — AB (ref >60)
GLUCOSE: 166 mg/dL — AB (ref 70–99)
POTASSIUM: 3.2 mmol/L — AB (ref 3.5–5.1)
SODIUM: 140 mmol/L (ref 135–145)

## 2018-09-26 LAB — PROTIME-INR
INR: 2.86
Prothrombin Time: 29.6 seconds — ABNORMAL HIGH (ref 11.4–15.2)

## 2018-09-26 MED ORDER — ALBUTEROL SULFATE (2.5 MG/3ML) 0.083% IN NEBU: 2.5000 mg | INHALATION_SOLUTION | RESPIRATORY_TRACT | Status: DC | PRN

## 2018-09-26 MED ORDER — METOPROLOL TARTRATE 50 MG PO TABS
100.0000 mg | ORAL_TABLET | Freq: Once | ORAL | Status: AC
  Administered 2018-09-26: 100 mg via ORAL
  Filled 2018-09-26: qty 2

## 2018-09-26 MED ORDER — LOSARTAN POTASSIUM 25 MG PO TABS
25.0000 mg | ORAL_TABLET | Freq: Every day | ORAL | Status: DC
  Administered 2018-09-26 – 2018-09-27 (×2): 25 mg via ORAL
  Filled 2018-09-26 (×2): qty 1

## 2018-09-26 MED ORDER — WARFARIN SODIUM 2 MG PO TABS
2.0000 mg | ORAL_TABLET | Freq: Every day | ORAL | Status: DC
  Administered 2018-09-26 – 2018-09-28 (×3): 2 mg via ORAL
  Filled 2018-09-26 (×4): qty 1

## 2018-09-26 MED ORDER — POTASSIUM CHLORIDE CRYS ER 20 MEQ PO TBCR
40.0000 meq | EXTENDED_RELEASE_TABLET | Freq: Once | ORAL | Status: AC
  Administered 2018-09-26: 40 meq via ORAL
  Filled 2018-09-26: qty 2

## 2018-09-26 MED ORDER — METOPROLOL TARTRATE 5 MG/5ML IV SOLN
INTRAVENOUS | Status: AC
  Filled 2018-09-26: qty 5

## 2018-09-26 MED ORDER — LOSARTAN POTASSIUM-HCTZ 50-12.5 MG PO TABS: 0.5000 | ORAL_TABLET | Freq: Every day | ORAL | Status: DC

## 2018-09-26 MED ORDER — ACETAMINOPHEN 325 MG PO TABS: 650.0000 mg | ORAL_TABLET | Freq: Four times a day (QID) | ORAL | Status: DC | PRN

## 2018-09-26 MED ORDER — FENTANYL CITRATE (PF) 100 MCG/2ML IJ SOLN
50.0000 ug | Freq: Once | INTRAMUSCULAR | Status: AC
  Administered 2018-09-26: 50 ug via INTRAVENOUS
  Filled 2018-09-26: qty 2

## 2018-09-26 MED ORDER — ONDANSETRON HCL 4 MG PO TABS: 4.0000 mg | ORAL_TABLET | Freq: Four times a day (QID) | ORAL | Status: DC | PRN

## 2018-09-26 MED ORDER — METOPROLOL TARTRATE 5 MG/5ML IV SOLN
5.0000 mg | Freq: Once | INTRAVENOUS | Status: AC
  Administered 2018-09-26: 5 mg via INTRAVENOUS

## 2018-09-26 MED ORDER — SODIUM CHLORIDE 0.9 % IV BOLUS
1000.0000 mL | Freq: Once | INTRAVENOUS | Status: AC
  Administered 2018-09-26: 1000 mL via INTRAVENOUS

## 2018-09-26 MED ORDER — FUROSEMIDE 20 MG PO TABS
20.0000 mg | ORAL_TABLET | ORAL | Status: DC
  Administered 2018-09-26 – 2018-09-29 (×2): 20 mg via ORAL
  Filled 2018-09-26 (×2): qty 1

## 2018-09-26 MED ORDER — FERROUS SULFATE 325 (65 FE) MG PO TABS
325.0000 mg | ORAL_TABLET | Freq: Every day | ORAL | Status: DC
  Administered 2018-09-26 – 2018-09-29 (×4): 325 mg via ORAL
  Filled 2018-09-26 (×4): qty 1

## 2018-09-26 MED ORDER — TRAMADOL HCL 50 MG PO TABS
50.0000 mg | ORAL_TABLET | Freq: Four times a day (QID) | ORAL | Status: DC | PRN
  Administered 2018-09-26 – 2018-09-27 (×2): 50 mg via ORAL
  Filled 2018-09-26 (×2): qty 1

## 2018-09-26 MED ORDER — DILTIAZEM HCL-DEXTROSE 100-5 MG/100ML-% IV SOLN (PREMIX)
5.0000 mg/h | INTRAVENOUS | Status: DC
  Administered 2018-09-26: 5 mg/h via INTRAVENOUS
  Filled 2018-09-26: qty 100

## 2018-09-26 MED ORDER — DIGOXIN 0.25 MG/ML IJ SOLN: 0.2500 mg | Freq: Once | INTRAMUSCULAR | Status: DC

## 2018-09-26 MED ORDER — LEVOTHYROXINE SODIUM 88 MCG PO TABS
88.0000 ug | ORAL_TABLET | Freq: Every day | ORAL | Status: DC
  Administered 2018-09-27 – 2018-09-29 (×3): 88 ug via ORAL
  Filled 2018-09-26 (×3): qty 1

## 2018-09-26 MED ORDER — METOPROLOL TARTRATE 50 MG PO TABS
100.0000 mg | ORAL_TABLET | Freq: Two times a day (BID) | ORAL | Status: DC
  Administered 2018-09-26 – 2018-09-29 (×6): 100 mg via ORAL
  Filled 2018-09-26 (×6): qty 2

## 2018-09-26 MED ORDER — MELATONIN 5 MG PO TABS
10.0000 mg | ORAL_TABLET | Freq: Every day | ORAL | Status: DC
  Administered 2018-09-26 – 2018-09-28 (×3): 10 mg via ORAL
  Filled 2018-09-26 (×4): qty 2

## 2018-09-26 MED ORDER — DIGOXIN 125 MCG PO TABS
0.1250 mg | ORAL_TABLET | Freq: Every day | ORAL | Status: DC
  Administered 2018-09-26 – 2018-09-28 (×3): 0.125 mg via ORAL
  Filled 2018-09-26 (×3): qty 1

## 2018-09-26 MED ORDER — SODIUM CHLORIDE 0.9% FLUSH
3.0000 mL | Freq: Two times a day (BID) | INTRAVENOUS | Status: DC
  Administered 2018-09-26 – 2018-09-29 (×6): 3 mL via INTRAVENOUS

## 2018-09-26 MED ORDER — PANTOPRAZOLE SODIUM 40 MG PO TBEC
40.0000 mg | DELAYED_RELEASE_TABLET | Freq: Every day | ORAL | Status: DC
  Administered 2018-09-26 – 2018-09-29 (×4): 40 mg via ORAL
  Filled 2018-09-26 (×4): qty 1

## 2018-09-26 MED ORDER — WARFARIN - PHYSICIAN DOSING INPATIENT
Freq: Every day | Status: DC
Start: 2018-09-26 — End: 2018-09-27

## 2018-09-26 MED ORDER — SPIRONOLACTONE 25 MG PO TABS
12.5000 mg | ORAL_TABLET | ORAL | Status: DC
Start: 2018-09-26 — End: 2018-09-29
  Administered 2018-09-26 – 2018-09-29 (×2): 12.5 mg via ORAL
  Filled 2018-09-26: qty 1
  Filled 2018-09-26 (×2): qty 0.5

## 2018-09-26 MED ORDER — ATORVASTATIN CALCIUM 10 MG PO TABS
10.0000 mg | ORAL_TABLET | Freq: Every day | ORAL | Status: DC
  Administered 2018-09-27 – 2018-09-28 (×2): 10 mg via ORAL
  Filled 2018-09-26 (×2): qty 1

## 2018-09-26 MED ORDER — HYDROCHLOROTHIAZIDE 10 MG/ML ORAL SUSPENSION
6.2500 mg | Freq: Every day | ORAL | Status: DC
  Administered 2018-09-27: 6.25 mg via ORAL
  Filled 2018-09-26 (×3): qty 1.25

## 2018-09-26 MED ORDER — OXYCODONE HCL 5 MG PO TABS
5.0000 mg | ORAL_TABLET | Freq: Four times a day (QID) | ORAL | Status: DC | PRN
  Administered 2018-09-26 – 2018-09-29 (×5): 5 mg via ORAL
  Filled 2018-09-26 (×6): qty 1

## 2018-09-26 MED ORDER — POLYETHYLENE GLYCOL 3350 17 G PO PACK
17.0000 g | PACK | Freq: Every day | ORAL | Status: DC | PRN
  Administered 2018-09-29: 17 g via ORAL
  Filled 2018-09-26: qty 1

## 2018-09-26 MED ORDER — ONDANSETRON HCL 4 MG/2ML IJ SOLN: 4.0000 mg | Freq: Four times a day (QID) | INTRAMUSCULAR | Status: DC | PRN

## 2018-09-26 MED ORDER — ACETAMINOPHEN 650 MG RE SUPP: 650.0000 mg | Freq: Four times a day (QID) | RECTAL | Status: DC | PRN

## 2018-09-26 NOTE — Progress Notes (Signed)
Advance care planning  Purpose of Encounter Afib, Rib fractures  Parties in Attendance Patient and daughter in law at bedside  Patients Decisional capacity Alert and oriented. Able to make medical decisions  Son and daughter in law make help make decisions. No documented HCPOA or advance directives in place.  Afib and rib fractures discussed. Patient wants to be a full code  FULL CODE orders entered  Time spent - 17 minutes

## 2018-09-26 NOTE — ED Notes (Signed)
EDP at bedside  

## 2018-09-26 NOTE — ED Notes (Signed)
Patient transported to CT. Ok for pt to go without nurse per dr Lenard Lance
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
## 2018-09-26 NOTE — ED Notes (Signed)
Attempted to call report. Unable to take patient at this time r/t drip she is on.

## 2018-09-26 NOTE — ED Notes (Signed)
MD aware pts HR remains 140s
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
## 2018-09-27 ENCOUNTER — Inpatient Hospital Stay
Admit: 2018-09-27 | Discharge: 2018-09-27 | Disposition: A | Discharge status: Home / Self Care | Payer: Medicare Other | Attending: Internal Medicine | Admitting: Internal Medicine

## 2018-09-27 LAB — BASIC METABOLIC PANEL
Anion gap: 10 (ref 5–15)
BUN: 20 mg/dL (ref 8–23)
CO2: 23 mmol/L (ref 22–32)
CREATININE: 0.92 mg/dL (ref 0.44–1.00)
Calcium: 8.9 mg/dL (ref 8.9–10.3)
Chloride: 105 mmol/L (ref 98–111)
GFR calc Af Amer: >60 mL/min (ref >60)
GFR calc non Af Amer: 57 mL/min — ABNORMAL LOW (ref >60)
GLUCOSE: 127 mg/dL — AB (ref 70–99)
Potassium: 4.1 mmol/L (ref 3.5–5.1)
Sodium: 138 mmol/L (ref 135–145)

## 2018-09-27 LAB — CBC
HCT: 38.4 % (ref 36.0–46.0)
HEMOGLOBIN: 12.1 g/dL (ref 12.0–15.0)
MCH: 28.3 pg (ref 26.0–34.0)
MCHC: 31.5 g/dL (ref 30.0–36.0)
MCV: 89.9 fL (ref 80.0–100.0)
Platelets: 208 10*3/uL (ref 150–400)
RBC: 4.27 MIL/uL (ref 3.87–5.11)
RDW: 14.2 % (ref 11.5–15.5)
WBC: 11.5 10*3/uL — ABNORMAL HIGH (ref 4.0–10.5)
nRBC: 0 % (ref 0.0–0.2)

## 2018-09-27 LAB — ECHOCARDIOGRAM COMPLETE
Height: 61 in
WEIGHTICAEL: 2003.2 [oz_av]

## 2018-09-27 LAB — DIGOXIN LEVEL: Digoxin Level: 0.8 ng/mL (ref 0.8–2.0)

## 2018-09-27 MED ORDER — WARFARIN - PHARMACIST DOSING INPATIENT
Freq: Every day | Status: DC
  Administered 2018-09-27 – 2018-09-28 (×2)

## 2018-09-27 MED ORDER — HYDROCHLOROTHIAZIDE 10 MG/ML ORAL SUSPENSION
6.2500 mg | Freq: Every day | ORAL | Status: DC
  Administered 2018-09-28 – 2018-09-29 (×2): 6.25 mg via ORAL
  Filled 2018-09-27 (×4): qty 0.63

## 2018-09-27 MED ORDER — LOSARTAN POTASSIUM 25 MG PO TABS
25.0000 mg | ORAL_TABLET | Freq: Every day | ORAL | Status: DC
  Administered 2018-09-28 – 2018-09-29 (×2): 25 mg via ORAL
  Filled 2018-09-27 (×2): qty 1
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
## 2018-09-27 NOTE — Progress Notes (Signed)
*  PRELIMINARY RESULTS* Echocardiogram 2D Echocardiogram has been performed.  Cristela Blue 09/27/2018, 12:18 PM

## 2018-09-28 LAB — CBC
HCT: 38.4 % (ref 36.0–46.0)
Hemoglobin: 12.2 g/dL (ref 12.0–15.0)
MCH: 28.6 pg (ref 26.0–34.0)
MCHC: 31.8 g/dL (ref 30.0–36.0)
MCV: 89.9 fL (ref 80.0–100.0)
Platelets: 200 10*3/uL (ref 150–400)
RBC: 4.27 MIL/uL (ref 3.87–5.11)
RDW: 14.2 % (ref 11.5–15.5)
WBC: 12.4 10*3/uL — AB (ref 4.0–10.5)
nRBC: 0 % (ref 0.0–0.2)

## 2018-09-28 LAB — PROTIME-INR
INR: 2.47
Prothrombin Time: 26.4 seconds — ABNORMAL HIGH (ref 11.4–15.2)

## 2018-09-28 MED ORDER — CYCLOBENZAPRINE HCL 10 MG PO TABS
5.0000 mg | ORAL_TABLET | Freq: Three times a day (TID) | ORAL | Status: DC | PRN
  Administered 2018-09-28 (×2): 5 mg via ORAL
  Filled 2018-09-28 (×2): qty 1

## 2018-09-28 MED ORDER — AMIODARONE HCL 200 MG PO TABS
400.0000 mg | ORAL_TABLET | Freq: Every day | ORAL | Status: DC
  Administered 2018-09-28 – 2018-09-29 (×2): 400 mg via ORAL
  Filled 2018-09-28 (×2): qty 2

## 2018-09-28 MED ORDER — DIGOXIN 125 MCG PO TABS
0.0625 mg | ORAL_TABLET | Freq: Every day | ORAL | Status: DC
  Administered 2018-09-29: 0.0625 mg via ORAL
  Filled 2018-09-28: qty 0.5
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
## 2018-09-28 NOTE — Progress Notes (Signed)
PT Cancellation Note  Patient Details Name: Karen Cunningham MRN: [id number] DOB: 1931/07/17   Cancelled Treatment:    Reason Eval/Treat Not Completed: Fatigue/lethargy limiting ability to participate(Consult received and chart reviewed. Patient politely declines session at this time, stating she "just got settled" after being up in chair (x1 hour) and ambulating to/from bathroom with staff.  Will re-attempt next date as appropriate.)  Sinclair Alligood H. Manson Passey, PT, DPT, NCS 09/28/18, 3:53 PM [phone]

## 2018-09-28 NOTE — Plan of Care (Signed)
  Problem: Health Behavior/Discharge Planning: Goal: Ability to manage health-related needs will improve Outcome: Not Progressing Note:  H.R. still not adequately controlled, patient started on PO Amiodarone to help manage the rates. Care team to monitor 1 more day, probable d/c in the AM. Raynald Blend

## 2018-09-29 MED ORDER — AMIODARONE HCL 200 MG PO TABS: ORAL_TABLET | ORAL | 7 refills | Status: DC

## 2018-09-29 MED ORDER — METOPROLOL TARTRATE 5 MG/5ML IV SOLN: 5.0000 mg | INTRAVENOUS | Status: DC | PRN

## 2018-09-29 MED ORDER — OXYCODONE HCL 5 MG PO TABS: 5.0000 mg | ORAL_TABLET | Freq: Four times a day (QID) | ORAL | 0 refills | Status: DC | PRN

## 2018-09-29 MED ORDER — ACETAMINOPHEN 500 MG PO TABS: 500.0000 mg | ORAL_TABLET | Freq: Four times a day (QID) | ORAL | 0 refills | Status: AC | PRN

## 2018-09-29 MED ORDER — OXYCODONE HCL 5 MG PO TABS
5.0000 mg | ORAL_TABLET | Freq: Four times a day (QID) | ORAL | Status: DC | PRN
  Administered 2018-09-29: 5 mg via ORAL
  Filled 2018-09-29: qty 1

## 2018-09-29 NOTE — Progress Notes (Signed)
Patient given discharge instructions with daughter in law at bedside. IV's taken out and tele monitor off. Patient and daughter in law verbalized understanding with all questions answered. Patient going home via family vehicle.
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
## 2018-09-29 NOTE — Plan of Care (Signed)
  Problem: Activity: Goal: Ability to tolerate increased activity will improve Outcome: Progressing   Problem: Cardiac: Goal: Ability to achieve and maintain adequate cardiopulmonary perfusion will improve Outcome: Progressing   

## 2018-09-29 NOTE — Plan of Care (Signed)
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
## 2018-09-29 NOTE — Progress Notes (Signed)
PT Cancellation Note  Patient Details Name: Karen Cunningham MRN: [id number] DOB: 26-Nov-1930   Cancelled Treatment:    Reason Eval/Treat Not Completed: Medical issues which prohibited therapy(Evaluation re-attempted.  Patient noted with resting HR between 130s-150s; contraindicated for exertional activity at this time.  Will contine to follow and initiate as medically appropriate.)   Gertrude Bucks H. Manson Passey, PT, DPT, NCS 09/29/18, 9:04 AM [phone]

## 2018-09-29 NOTE — Care Management Important Message (Signed)
Copy of signed Medicare IM left with patient in room. 

## 2018-09-29 NOTE — Progress Notes (Signed)
Patient's heart rate much better. Ranging from 80s- 1'teens. Patient has ambulated to the bathroom. PRN medication given for pain and MD updated. MD at bedside- patient being discharged home.

## 2018-09-29 NOTE — Care Management Note (Signed)
Case Management Note  Patient Details  Name: Jenelle MagesColleen B Sanders MRN: [id number] Date of Birth: Jun 13, 1931  Subjective/Objective:     Patient is discharging to home today.  Rosey Batheresa, with [facility] aware.                 Action/Plan:   Expected Discharge Date:  09/29/18               Expected Discharge Plan:  Home w Home Health Services  In-House Referral:     Discharge planning Services  CM Consult  Post Acute Care Choice:  Home Health Choice offered to:  Patient  DME Arranged:    DME Agency:     HH Arranged:  RN, PT, Nurse's Aide HH Agency:  [facility] ([facility])  Status of Service:  Completed, signed off  If discussed at [employer] Meetings, dates discussed:    Additional Comments:  Sherren KernsJennifer L Cohen Doleman, RN 09/29/2018, 2:33 PM

## 2018-10-07 ENCOUNTER — Other Ambulatory Visit: Payer: Self-pay

## 2018-10-07 ENCOUNTER — Emergency Department
Admission: EM | Admit: 2018-10-07 | Discharge: 2018-10-07 | Disposition: A | Discharge status: Home / Self Care | Payer: Medicare Other | Attending: Emergency Medicine | Admitting: Emergency Medicine

## 2018-10-07 DIAGNOSIS — I129 Hypertensive chronic kidney disease with stage 1 through stage 4 chronic kidney disease, or unspecified chronic kidney disease: Secondary | ICD-10-CM | POA: Insufficient documentation

## 2018-10-07 DIAGNOSIS — R799 Abnormal finding of blood chemistry, unspecified: Secondary | ICD-10-CM | POA: Diagnosis present

## 2018-10-07 DIAGNOSIS — E039 Hypothyroidism, unspecified: Secondary | ICD-10-CM | POA: Insufficient documentation

## 2018-10-07 DIAGNOSIS — Z96651 Presence of right artificial knee joint: Secondary | ICD-10-CM | POA: Insufficient documentation

## 2018-10-07 DIAGNOSIS — Z96642 Presence of left artificial hip joint: Secondary | ICD-10-CM | POA: Insufficient documentation

## 2018-10-07 DIAGNOSIS — Z79899 Other long term (current) drug therapy: Secondary | ICD-10-CM | POA: Insufficient documentation

## 2018-10-07 DIAGNOSIS — Z96641 Presence of right artificial hip joint: Secondary | ICD-10-CM | POA: Insufficient documentation

## 2018-10-07 DIAGNOSIS — Z96652 Presence of left artificial knee joint: Secondary | ICD-10-CM | POA: Diagnosis not present

## 2018-10-07 DIAGNOSIS — N189 Chronic kidney disease, unspecified: Secondary | ICD-10-CM | POA: Insufficient documentation

## 2018-10-07 DIAGNOSIS — R791 Abnormal coagulation profile: Secondary | ICD-10-CM | POA: Diagnosis not present

## 2018-10-07 DIAGNOSIS — Z7901 Long term (current) use of anticoagulants: Secondary | ICD-10-CM | POA: Diagnosis not present

## 2018-10-07 NOTE — Discharge Instructions (Addendum)
Your cardiologist recommends that you do not take your Coumadin or your amiodarone until you see him at 9 AM on Monday
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
## 2018-10-07 NOTE — ED Notes (Signed)
ED Provider at bedside. 

## 2018-11-11 ENCOUNTER — Other Ambulatory Visit: Payer: Self-pay | Admitting: Internal Medicine

## 2018-11-11 DIAGNOSIS — R945 Abnormal results of liver function studies: Secondary | ICD-10-CM

## 2018-11-15 ENCOUNTER — Ambulatory Visit: Payer: Medicare Other | Admitting: Podiatry

## 2018-11-16 ENCOUNTER — Ambulatory Visit
Admission: RE | Admit: 2018-11-16 | Discharge: 2018-11-16 | Disposition: A | Discharge status: Home / Self Care | Payer: Medicare Other | Source: Ambulatory Visit | Attending: Internal Medicine | Admitting: Internal Medicine

## 2018-11-16 DIAGNOSIS — R945 Abnormal results of liver function studies: Secondary | ICD-10-CM | POA: Insufficient documentation

## 2019-06-16 ENCOUNTER — Ambulatory Visit (INDEPENDENT_AMBULATORY_CARE_PROVIDER_SITE_OTHER): Payer: Medicare Other | Admitting: Podiatry

## 2019-06-16 ENCOUNTER — Other Ambulatory Visit: Payer: Self-pay

## 2019-06-16 DIAGNOSIS — M79676 Pain in unspecified toe(s): Secondary | ICD-10-CM

## 2019-06-16 DIAGNOSIS — B351 Tinea unguium: Secondary | ICD-10-CM

## 2019-06-16 DIAGNOSIS — M79609 Pain in unspecified limb: Secondary | ICD-10-CM

## 2019-06-19 NOTE — Progress Notes (Signed)
   SUBJECTIVE Patient presents to office today complaining of elongated, thickened nails that cause pain while ambulating in shoes. She is unable to trim her own nails. Patient is here for further evaluation and treatment.  Past Medical History:  Diagnosis Date  . Anemia   . Arthritis   . Atrial fibrillation ([place])   . Chronic kidney disease   . Coronary artery disease   . GERD (gastroesophageal reflux disease)   . HLD (hyperlipidemia)   . Hypertension   . Hypothyroidism     OBJECTIVE General Patient is awake, alert, and oriented x 3 and in no acute distress. Derm Skin is dry and supple bilateral. Negative open lesions or macerations. Remaining integument unremarkable. Nails are tender, long, thickened and dystrophic with subungual debris, consistent with onychomycosis, 1-5 bilateral. No signs of infection noted. Vasc  DP and PT pedal pulses palpable bilaterally. Temperature gradient within normal limits.  Neuro Epicritic and protective threshold sensation grossly intact bilaterally.  Musculoskeletal Exam No symptomatic pedal deformities noted bilateral. Muscular strength within normal limits.  ASSESSMENT 1. Onychodystrophic nails 1-5 bilateral with hyperkeratosis of nails.  2. Onychomycosis of nail due to dermatophyte bilateral 3. Pain in foot bilateral  PLAN OF CARE 1. Patient evaluated today.  2. Instructed to maintain good pedal hygiene and foot care.  3. Mechanical debridement of nails 1-5 bilaterally performed using a nail nipper. Filed with dremel without incident.  4. Return to clinic in 3 mos.    Edrick Kins, DPM [facility]  Dr. Edrick Kins, [place]                                        [place], [place] [address]                Office [phone]  Fax [phone]

## 2019-07-10 ENCOUNTER — Other Ambulatory Visit: Payer: Self-pay

## 2019-07-10 DIAGNOSIS — Z20822 Contact with and (suspected) exposure to covid-19: Secondary | ICD-10-CM

## 2019-07-12 LAB — NOVEL CORONAVIRUS, NAA: SARS-CoV-2, NAA: NOT DETECTED

## 2019-08-31 ENCOUNTER — Ambulatory Visit (INDEPENDENT_AMBULATORY_CARE_PROVIDER_SITE_OTHER): Payer: Medicare Other | Admitting: Internal Medicine

## 2019-08-31 ENCOUNTER — Encounter: Payer: Self-pay | Admitting: Internal Medicine

## 2019-08-31 ENCOUNTER — Other Ambulatory Visit: Payer: Self-pay

## 2019-08-31 VITALS — BP 140/92 | HR 129 | Ht 61.0 in | Wt 142.5 lb

## 2019-08-31 DIAGNOSIS — I5032 Chronic diastolic (congestive) heart failure: Secondary | ICD-10-CM | POA: Diagnosis not present

## 2019-08-31 DIAGNOSIS — I10 Essential (primary) hypertension: Secondary | ICD-10-CM | POA: Diagnosis not present

## 2019-08-31 DIAGNOSIS — E785 Hyperlipidemia, unspecified: Secondary | ICD-10-CM

## 2019-08-31 DIAGNOSIS — Z79899 Other long term (current) drug therapy: Secondary | ICD-10-CM

## 2019-08-31 DIAGNOSIS — I2510 Atherosclerotic heart disease of native coronary artery without angina pectoris: Secondary | ICD-10-CM

## 2019-08-31 DIAGNOSIS — I4821 Permanent atrial fibrillation: Secondary | ICD-10-CM

## 2019-08-31 MED ORDER — SPIRONOLACTONE 25 MG PO TABS: 12.5000 mg | ORAL_TABLET | Freq: Every day | ORAL | 3 refills | Status: DC

## 2019-08-31 MED ORDER — FUROSEMIDE 20 MG PO TABS: 20.0000 mg | ORAL_TABLET | Freq: Every day | ORAL | 2 refills | Status: DC

## 2019-08-31 MED ORDER — DILTIAZEM HCL ER COATED BEADS 180 MG PO CP24: 180.0000 mg | ORAL_CAPSULE | Freq: Every day | ORAL | 3 refills | Status: DC

## 2019-08-31 NOTE — Patient Instructions (Signed)
Medication Instructions:  Your physician has recommended you make the following change in your medication:  1- CHANGE Furosemide to 20 mg by mouth once a day. 2- CHANGE Spironolactone to 12.5 mg (0.5 tablet) by mouth once a day. 3- START Diltiazem 180 mg by mouth once a day.  *If you need a refill on your cardiac medications before your next appointment, please call your pharmacy*  Lab Work: Your physician recommends that you return for lab work in: 1 week on the same day you are seen in the Coumadin Clinic (Mon or Wed).   If you have labs (blood work) drawn today and your tests are completely normal, you will receive your results only by: Marland Kitchen MyChart Message (if you have MyChart) OR . A paper copy in the mail If you have any lab test that is abnormal or we need to change your treatment, we will call you to review the results.  Testing/Procedures: none  Follow-Up: Your physician recommends that you schedule a follow-up appointment in: Coumadin Clinic at our office next week.    At [facility], you and your health needs are our priority.  As part of our continuing mission to provide you with exceptional heart care, we have created designated Provider Care Teams.  These Care Teams include your primary Cardiologist (physician) and Advanced Practice Providers (APPs -  Physician Assistants and Nurse Practitioners) who all work together to provide you with the care you need, when you need it.  Your next appointment:   3 week(s)  The format for your next appointment:   In Person  Provider:    You may see DR Harrell Gave END or one of the following Advanced Practice Providers on your designated Care Team:    Murray Hodgkins, NP  Christell Faith, PA-C  Marrianne Mood, PA-C   Other Instructions Request records - Cardiac testing from [facility] with Dr Humphrey Rolls.
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
## 2019-09-01 ENCOUNTER — Encounter: Payer: Self-pay | Admitting: Internal Medicine

## 2019-09-01 DIAGNOSIS — E785 Hyperlipidemia, unspecified: Secondary | ICD-10-CM | POA: Insufficient documentation

## 2019-09-01 DIAGNOSIS — I10 Essential (primary) hypertension: Secondary | ICD-10-CM | POA: Insufficient documentation

## 2019-09-01 DIAGNOSIS — I2510 Atherosclerotic heart disease of native coronary artery without angina pectoris: Secondary | ICD-10-CM | POA: Insufficient documentation

## 2019-09-01 DIAGNOSIS — E782 Mixed hyperlipidemia: Secondary | ICD-10-CM | POA: Insufficient documentation

## 2019-09-01 DIAGNOSIS — I5032 Chronic diastolic (congestive) heart failure: Secondary | ICD-10-CM | POA: Insufficient documentation

## 2019-09-05 ENCOUNTER — Telehealth: Payer: Self-pay | Admitting: Internal Medicine

## 2019-09-05 ENCOUNTER — Ambulatory Visit: Payer: Medicare Other | Admitting: Podiatry

## 2019-09-05 NOTE — Telephone Encounter (Signed)
Spoke to [place]. Pt will keep appointments for tomorrow. However, if the weather is to bad and pt can not come out Karen Cunningham will call in the morning and try to reschedule. She stated if pt is not able to get INR checked tomorrow she will try to get it checked this week at pt's PCP and will have them mange pt's INR until she able to make a new coumadin appointment at the [facility] office.

## 2019-09-05 NOTE — Telephone Encounter (Signed)
Anderson Malta -  Patient daughter cancelled labs / coumadin due to weather and wants to make sure it it ok to wait until Monday.    She is going to try to have coumadin checked at pcp office as there is still a standing order there .     Please advise if ok   [facility] - It looks like the previous appt overbooked you . Is there a time we can maybe overbook you for Monday if she is not able to have coumadin check elsewhere?

## 2019-09-06 ENCOUNTER — Other Ambulatory Visit: Payer: Self-pay

## 2019-09-06 ENCOUNTER — Other Ambulatory Visit (INDEPENDENT_AMBULATORY_CARE_PROVIDER_SITE_OTHER): Payer: Medicare Other

## 2019-09-06 ENCOUNTER — Ambulatory Visit (INDEPENDENT_AMBULATORY_CARE_PROVIDER_SITE_OTHER): Payer: Medicare Other

## 2019-09-06 DIAGNOSIS — Z79899 Other long term (current) drug therapy: Secondary | ICD-10-CM

## 2019-09-06 DIAGNOSIS — Z5181 Encounter for therapeutic drug level monitoring: Secondary | ICD-10-CM | POA: Diagnosis not present

## 2019-09-06 DIAGNOSIS — I4821 Permanent atrial fibrillation: Secondary | ICD-10-CM | POA: Diagnosis not present

## 2019-09-06 DIAGNOSIS — I4891 Unspecified atrial fibrillation: Secondary | ICD-10-CM

## 2019-09-06 LAB — POCT INR: INR: 3.4 — AB (ref 2.0–3.0)

## 2019-09-06 NOTE — Patient Instructions (Signed)
Since you've already taken it today, please SKIP WARFARIN TOMORROW , then resume dosage of 1 tablet (2 mg) every day. Recheck INR in 3 weeks.

## 2019-09-07 ENCOUNTER — Telehealth: Payer: Self-pay | Admitting: *Deleted

## 2019-09-07 LAB — BASIC METABOLIC PANEL
BUN/Creatinine Ratio: 25 (ref 12–28)
BUN: 31 mg/dL — ABNORMAL HIGH (ref 8–27)
CO2: 23 mmol/L (ref 20–29)
Calcium: 9.7 mg/dL (ref 8.7–10.3)
Chloride: 99 mmol/L (ref 96–106)
Creatinine, Ser: 1.26 mg/dL — ABNORMAL HIGH (ref 0.57–1.00)
GFR calc Af Amer: 45 mL/min/{1.73_m2} — ABNORMAL LOW (ref >59)
GFR calc non Af Amer: 39 mL/min/{1.73_m2} — ABNORMAL LOW (ref >59)
Glucose: 91 mg/dL (ref 65–99)
Potassium: 4.5 mmol/L (ref 3.5–5.2)
Sodium: 139 mmol/L (ref 134–144)

## 2019-09-07 MED ORDER — SPIRONOLACTONE 25 MG PO TABS: ORAL_TABLET | ORAL | 3 refills | Status: DC

## 2019-09-07 MED ORDER — FUROSEMIDE 20 MG PO TABS: ORAL_TABLET | ORAL | 2 refills | Status: DC

## 2019-09-07 NOTE — Telephone Encounter (Signed)
Results called to patient's daughter-in-law, ok per DPR. She verbalized understanding of the results and to go back to twice weekly dosing of furosemide and spironolactone. Med list updated. She also said patient feels much better already.  She was able to make her bed and walk to the mailbox with no problems.  They are very appreciative.

## 2019-09-07 NOTE — Telephone Encounter (Signed)
-----   Message from Nelva Bush, MD sent at 09/07/2019  7:12 AM EST ----- Please let Karen Cunningham know that there has been a small bump in her BUN and creatinine, likely related to daily dosing of furosemide and spironolactone.  This is expected.  I recommend that she return to the twice weekly dosing of furosemide and spironolactone (Mondays and Thursdays), as she was previously doing.

## 2019-09-20 ENCOUNTER — Encounter: Payer: Self-pay | Admitting: Nurse Practitioner

## 2019-09-20 ENCOUNTER — Ambulatory Visit (INDEPENDENT_AMBULATORY_CARE_PROVIDER_SITE_OTHER): Payer: Medicare Other | Admitting: Nurse Practitioner

## 2019-09-20 ENCOUNTER — Other Ambulatory Visit: Payer: Self-pay

## 2019-09-20 VITALS — BP 130/70 | HR 66 | Resp 20 | Ht 61.0 in | Wt 141.0 lb

## 2019-09-20 DIAGNOSIS — I739 Peripheral vascular disease, unspecified: Secondary | ICD-10-CM | POA: Insufficient documentation

## 2019-09-20 DIAGNOSIS — R002 Palpitations: Secondary | ICD-10-CM | POA: Insufficient documentation

## 2019-09-20 DIAGNOSIS — D509 Iron deficiency anemia, unspecified: Secondary | ICD-10-CM | POA: Insufficient documentation

## 2019-09-20 DIAGNOSIS — I2510 Atherosclerotic heart disease of native coronary artery without angina pectoris: Secondary | ICD-10-CM | POA: Diagnosis not present

## 2019-09-20 DIAGNOSIS — E079 Disorder of thyroid, unspecified: Secondary | ICD-10-CM | POA: Insufficient documentation

## 2019-09-20 DIAGNOSIS — I10 Essential (primary) hypertension: Secondary | ICD-10-CM | POA: Diagnosis not present

## 2019-09-20 DIAGNOSIS — E785 Hyperlipidemia, unspecified: Secondary | ICD-10-CM

## 2019-09-20 DIAGNOSIS — N183 Chronic kidney disease, stage 3 unspecified: Secondary | ICD-10-CM

## 2019-09-20 DIAGNOSIS — I5032 Chronic diastolic (congestive) heart failure: Secondary | ICD-10-CM

## 2019-09-20 DIAGNOSIS — I4821 Permanent atrial fibrillation: Secondary | ICD-10-CM | POA: Diagnosis not present

## 2019-09-20 DIAGNOSIS — I779 Disorder of arteries and arterioles, unspecified: Secondary | ICD-10-CM | POA: Diagnosis not present

## 2019-09-20 DIAGNOSIS — I340 Nonrheumatic mitral (valve) insufficiency: Secondary | ICD-10-CM | POA: Insufficient documentation

## 2019-09-20 DIAGNOSIS — E039 Hypothyroidism, unspecified: Secondary | ICD-10-CM | POA: Insufficient documentation

## 2019-09-20 MED ORDER — LOSARTAN POTASSIUM 50 MG PO TABS: 50.0000 mg | ORAL_TABLET | Freq: Every day | ORAL | 3 refills | Status: DC

## 2019-09-20 MED ORDER — HYDROCHLOROTHIAZIDE 12.5 MG PO TABS: 12.5000 mg | ORAL_TABLET | Freq: Every day | ORAL | 3 refills | Status: DC

## 2019-09-20 MED ORDER — METOPROLOL TARTRATE 100 MG PO TABS: 100.0000 mg | ORAL_TABLET | Freq: Two times a day (BID) | ORAL | 3 refills | Status: DC

## 2019-09-20 MED ORDER — WARFARIN SODIUM 2 MG PO TABS: 2.0000 mg | ORAL_TABLET | Freq: Every evening | ORAL | 3 refills | Status: DC

## 2019-09-20 MED ORDER — ATORVASTATIN CALCIUM 40 MG PO TABS: 40.0000 mg | ORAL_TABLET | Freq: Every day | ORAL | 3 refills | Status: DC

## 2019-09-20 NOTE — Patient Instructions (Signed)
Medication Instructions:  1- INCREASE Atorvastatin Take 1 tablet (40 mg total) by mouth at bedtime *If you need a refill on your cardiac medications before your next appointment, please call your pharmacy*  Lab Work: Your physician recommends that you have lab work today(BMET)  If you have labs (blood work) drawn today and your tests are completely normal, you will receive your results only by: Marland Kitchen MyChart Message (if you have MyChart) OR . A paper copy in the mail If you have any lab test that is abnormal or we need to change your treatment, we will call you to review the results.  Testing/Procedures: 1- Your physician has requested that you have a carotid duplex. This test is an ultrasound of the carotid arteries in your neck. It looks at blood flow through these arteries that supply the brain with blood. Allow one hour for this exam. There are no restrictions or special instructions.    Follow-Up: At [facility], you and your health needs are our priority.  As part of our continuing mission to provide you with exceptional heart care, we have created designated Provider Care Teams.  These Care Teams include your primary Cardiologist (physician) and Advanced Practice Providers (APPs -  Physician Assistants and Nurse Practitioners) who all work together to provide you with the care you need, when you need it.  Your next appointment:   3 month(s)  The format for your next appointment:   In Person  Provider:    You may see Nelva Bush, MD or Murray Hodgkins, NP.
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
## 2019-09-21 LAB — BASIC METABOLIC PANEL
BUN/Creatinine Ratio: 26 (ref 12–28)
BUN: 30 mg/dL — ABNORMAL HIGH (ref 8–27)
CO2: 24 mmol/L (ref 20–29)
Calcium: 9.9 mg/dL (ref 8.7–10.3)
Chloride: 100 mmol/L (ref 96–106)
Creatinine, Ser: 1.15 mg/dL — ABNORMAL HIGH (ref 0.57–1.00)
GFR calc Af Amer: 50 mL/min/{1.73_m2} — ABNORMAL LOW (ref >59)
GFR calc non Af Amer: 43 mL/min/{1.73_m2} — ABNORMAL LOW (ref >59)
Glucose: 106 mg/dL — ABNORMAL HIGH (ref 65–99)
Potassium: 4.2 mmol/L (ref 3.5–5.2)
Sodium: 140 mmol/L (ref 134–144)

## 2019-09-27 ENCOUNTER — Ambulatory Visit (INDEPENDENT_AMBULATORY_CARE_PROVIDER_SITE_OTHER): Payer: Medicare Other

## 2019-09-27 ENCOUNTER — Other Ambulatory Visit: Payer: Self-pay

## 2019-09-27 DIAGNOSIS — Z5181 Encounter for therapeutic drug level monitoring: Secondary | ICD-10-CM

## 2019-09-27 DIAGNOSIS — I4891 Unspecified atrial fibrillation: Secondary | ICD-10-CM

## 2019-09-27 LAB — POCT INR: INR: 3.7 — AB (ref 2.0–3.0)

## 2019-09-27 NOTE — Patient Instructions (Signed)
Please skip warfarin today, then resume dosage of 1 tablet (2 mg) every day. Have a serving of greens today.  Recheck INR in 3 weeks.

## 2019-09-28 ENCOUNTER — Ambulatory Visit (INDEPENDENT_AMBULATORY_CARE_PROVIDER_SITE_OTHER): Payer: Medicare Other

## 2019-09-28 DIAGNOSIS — I779 Disorder of arteries and arterioles, unspecified: Secondary | ICD-10-CM

## 2019-09-29 ENCOUNTER — Ambulatory Visit: Payer: Medicare Other | Admitting: Podiatry

## 2019-09-29 ENCOUNTER — Telehealth: Payer: Self-pay

## 2019-09-29 NOTE — Telephone Encounter (Signed)
Results called to pt. Pt verbalized understanding.  

## 2019-09-29 NOTE — Telephone Encounter (Signed)
Call to patient to review echo results. No answer. [email or web address].

## 2019-09-29 NOTE — Telephone Encounter (Signed)
-----   Message from Creig Hines, NP sent at 09/28/2019  5:17 PM EST ----- 1-39% bilat carotid stenoses.  Not significantly changed from prior report.

## 2019-09-29 NOTE — Telephone Encounter (Signed)
Patient returning call for echo results. 

## 2019-10-13 ENCOUNTER — Ambulatory Visit (INDEPENDENT_AMBULATORY_CARE_PROVIDER_SITE_OTHER): Payer: Medicare Other | Admitting: Podiatry

## 2019-10-13 ENCOUNTER — Other Ambulatory Visit: Payer: Self-pay

## 2019-10-13 DIAGNOSIS — M79609 Pain in unspecified limb: Secondary | ICD-10-CM

## 2019-10-13 DIAGNOSIS — B351 Tinea unguium: Secondary | ICD-10-CM | POA: Diagnosis not present

## 2019-10-16 NOTE — Progress Notes (Signed)
   SUBJECTIVE Patient presents to office today complaining of elongated, thickened nails that cause pain while ambulating in shoes. She is unable to trim her own nails. Patient is here for further evaluation and treatment.  Past Medical History:  Diagnosis Date  . (HFpEF) heart failure with preserved ejection fraction ([facility])    a. 07/2016 Echo: EF 51%; b. 12/2016 Echo: EF 41%. Mod TR, mild PR; c. 09/2017 Echo [facility] [facility]): EF 62%, Sev BAE. Mod TR, mild PR; d. 03/2019 Echo([facility]): EF 57%, Gr3 DD. Sev dil LA, mild dil RA. Midly dil Asc Ao. Trace AI. Mild to mod TR, mild PR. RVSP .  Marland Kitchen Anemia   . Arthritis   . Carotid arterial disease ([facility])    a. 10/2013 Mod to Sev RICA dzs. Mild dzs in L bulb; b. 10/2013 CTA Carotids: LICA 50p, RICA w/o stenosis.  . CKD (chronic kidney disease), stage III   . Coronary artery disease    a. Several neg MV between 2008 and 2010; b. 2010 s/p PCI/DES to the mLAD; c. 10/2013 MV: Fixed apical defect, likely breast attenuation, no ischemia; d. 09/2014 MV: No ischemia; e. 03/2018 MV [place]): no ischemia.  Marland Kitchen GERD (gastroesophageal reflux disease)   . HLD (hyperlipidemia)   . Hypertension   . Hypothyroidism   . Permanent atrial fibrillation ([facility])    a. Dx ~ 2017. CHA2DS2VASc = 6-->coumadin.    OBJECTIVE General Patient is awake, alert, and oriented x 3 and in no acute distress. Derm Skin is dry and supple bilateral. Negative open lesions or macerations. Remaining integument unremarkable. Nails are tender, long, thickened and dystrophic with subungual debris, consistent with onychomycosis, 1-5 bilateral. No signs of infection noted. Vasc  DP and PT pedal pulses palpable bilaterally. Temperature gradient within normal limits.  Neuro Epicritic and protective threshold sensation grossly intact bilaterally.  Musculoskeletal Exam No symptomatic pedal deformities noted bilateral. Muscular strength within normal limits.  ASSESSMENT 1. Onychodystrophic nails 1-5 bilateral with  hyperkeratosis of nails.  2. Onychomycosis of nail due to dermatophyte bilateral 3. Pain in foot bilateral  PLAN OF CARE 1. Patient evaluated today.  2. Instructed to maintain good pedal hygiene and foot care.  3. Mechanical debridement of nails 1-5 bilaterally performed using a nail nipper. Filed with dremel without incident.  4. Return to clinic in 3 mos.   Call her Miss Tawanda.    Felecia Shelling, DPM [facility]  Dr. Felecia Shelling, DPM    [address]                                        [place], [place] [address]                Office [phone]  Fax [phone]

## 2019-10-18 ENCOUNTER — Other Ambulatory Visit: Payer: Self-pay

## 2019-10-18 ENCOUNTER — Ambulatory Visit (INDEPENDENT_AMBULATORY_CARE_PROVIDER_SITE_OTHER): Payer: Medicare Other

## 2019-10-18 DIAGNOSIS — I4891 Unspecified atrial fibrillation: Secondary | ICD-10-CM

## 2019-10-18 DIAGNOSIS — Z5181 Encounter for therapeutic drug level monitoring: Secondary | ICD-10-CM | POA: Diagnosis not present

## 2019-10-18 LAB — POCT INR: INR: 2.9 (ref 2.0–3.0)

## 2019-10-18 NOTE — Patient Instructions (Signed)
Please continue warfarin dosage of 1 tablet (2 mg) every day. Recheck INR in 4 weeks.

## 2019-10-30 ENCOUNTER — Ambulatory Visit: Payer: Medicare Other | Attending: Internal Medicine

## 2019-10-30 DIAGNOSIS — Z20822 Contact with and (suspected) exposure to covid-19: Secondary | ICD-10-CM

## 2019-10-31 LAB — NOVEL CORONAVIRUS, NAA: SARS-CoV-2, NAA: NOT DETECTED

## 2019-11-12 ENCOUNTER — Ambulatory Visit: Payer: Medicare Other | Attending: Internal Medicine

## 2019-11-12 ENCOUNTER — Other Ambulatory Visit: Payer: Self-pay

## 2019-11-12 DIAGNOSIS — Z23 Encounter for immunization: Secondary | ICD-10-CM | POA: Insufficient documentation

## 2019-11-12 NOTE — Progress Notes (Signed)
   Covid-19 Vaccination Clinic  Name:  Karen Cunningham    MRN: [id number] DOB: April 12, 1931  11/12/2019  [name] was observed post Covid-19 immunization for 15 minutes without incidence. She was provided with Vaccine Information Sheet and instruction to access the V-Safe system.   [name] was instructed to call 911 with any severe reactions post vaccine: Marland Kitchen Difficulty breathing  . Swelling of your face and throat  . A fast heartbeat  . A bad rash all over your body  . Dizziness and weakness    Immunizations Administered    Name Date Dose VIS Date Route   Pfizer COVID-19 Vaccine 11/12/2019 12:17 PM 0.3 mL 09/01/2019 Intramuscular   Manufacturer: [employer], [employer]   Lot: [id number]   NDC: [address]-1

## 2019-11-15 ENCOUNTER — Ambulatory Visit (INDEPENDENT_AMBULATORY_CARE_PROVIDER_SITE_OTHER): Payer: Medicare Other

## 2019-11-15 ENCOUNTER — Other Ambulatory Visit: Payer: Self-pay

## 2019-11-15 DIAGNOSIS — I4891 Unspecified atrial fibrillation: Secondary | ICD-10-CM

## 2019-11-15 DIAGNOSIS — Z5181 Encounter for therapeutic drug level monitoring: Secondary | ICD-10-CM | POA: Diagnosis not present

## 2019-11-15 LAB — POCT INR: INR: 3.2 — AB (ref 2.0–3.0)

## 2019-11-15 NOTE — Patient Instructions (Signed)
-   skip warfarin today - START NEW DOSAGE of warfarin dosage of 1 tablet (2 mg) every day EXCEPT 1/2 TABLET (1 mg) on MONDAYS & FRIDAYS. - recheck INR in 4 weeks.

## 2019-11-24 ENCOUNTER — Encounter: Payer: Self-pay | Admitting: Internal Medicine

## 2019-12-05 ENCOUNTER — Ambulatory Visit: Payer: Medicare Other | Attending: Internal Medicine

## 2019-12-05 DIAGNOSIS — Z23 Encounter for immunization: Secondary | ICD-10-CM

## 2019-12-05 NOTE — Progress Notes (Signed)
   Covid-19 Vaccination Clinic  Name:  DESHONDA CRYDERMAN    MRN: [id number] DOB: Jun 14, 1931  12/05/2019  Ms. Kumagai was observed post Covid-19 immunization for 15 minutes without incident. She was provided with Vaccine Information Sheet and instruction to access the V-Safe system.   Ms. Lopresti was instructed to call 911 with any severe reactions post vaccine: Marland Kitchen Difficulty breathing  . Swelling of face and throat  . A fast heartbeat  . A bad rash all over body  . Dizziness and weakness   Immunizations Administered    Name Date Dose VIS Date Route   Pfizer COVID-19 Vaccine 12/05/2019  3:54 PM 0.3 mL 09/01/2019 Intramuscular   Manufacturer: [employer], [employer]   Lot: [id number]   NDC: [address]-0

## 2019-12-13 ENCOUNTER — Other Ambulatory Visit: Payer: Self-pay

## 2019-12-13 ENCOUNTER — Ambulatory Visit (INDEPENDENT_AMBULATORY_CARE_PROVIDER_SITE_OTHER): Payer: Medicare Other

## 2019-12-13 DIAGNOSIS — Z5181 Encounter for therapeutic drug level monitoring: Secondary | ICD-10-CM | POA: Diagnosis not present

## 2019-12-13 DIAGNOSIS — I4891 Unspecified atrial fibrillation: Secondary | ICD-10-CM

## 2019-12-13 LAB — POCT INR: INR: 2.1 (ref 2.0–3.0)

## 2019-12-13 NOTE — Patient Instructions (Signed)
-   continue dosage of warfarin dosage of 1 tablet (2 mg) every day EXCEPT 1/2 TABLET (1 mg) on MONDAYS & FRIDAYS. - recheck INR in 5 weeks.

## 2019-12-19 NOTE — Progress Notes (Signed)
Follow-up Outpatient Visit Date: 12/20/2019  Primary Care Provider: Margaretann Loveless, MD [address] [place] [address]  Chief Complaint: Shortness of breath  HPI:  Ms. Broadfoot is a 86 y.o. female with history of coronary artery disease, permanent atrial fibrillation, hypertension, hyperlipidemia, chronic kidney disease, hypothyroidism, and edema, who presents for follow-up of atrial fibrillation and coronary artery disease.  She was last seen in our office in late December, 2020, by Ward Givens, NP.  At that time, she reported less shortness of breath after diltiazem had been added to her regimen at our first meeting in early December.  She was advised to decrease furosemide and spironolactone back to twice weekly, given euvolemic appearance and slight worsening in renal function with daily dosing.  Today, Ms. Latendresse reports that she is doing fairly well.  She notes stable exertional dyspnea with modest activity.  She is able to complete all of her ADL's without limitations.  She has not had any chest pain, palpitations, or lightheadedness.  She notes that her balance is off at times and that she has tripped and fallen a couple of times without injuring herself.  Overall, she feels better since we added diltiazem last year.  She has not had any significant bleeding, remaining on warfarin.  She has been reluctant to pursue cardioversion in the past.  --------------------------------------------------------------------------------------------------  Past Medical History:  Diagnosis Date  . (HFpEF) heart failure with preserved ejection fraction (HCC)    a. 07/2016 Echo: EF 51%; b. 12/2016 Echo: EF 41%. Mod TR, mild PR; c. 09/2017 Echo [facility] [facility]): EF 62%, Sev BAE. Mod TR, mild PR; d. 03/2019 Echo([facility]): EF 57%, Gr3 DD. Sev dil LA, mild dil RA. Midly dil Asc Ao. Trace AI. Mild to mod TR, mild PR. RVSP .  Marland Kitchen Anemia   . Arthritis   . Carotid arterial disease (HCC)    a. 10/2013 Mod to Sev RICA dzs.  Mild dzs in L bulb; b. 10/2013 CTA Carotids: LICA 50p, RICA w/o stenosis.  . CKD (chronic kidney disease), stage III   . Coronary artery disease    a. Several neg MV between 2008 and 2010; b. 2010 s/p PCI/DES to the mLAD; c. 10/2013 MV: Fixed apical defect, likely breast attenuation, no ischemia; d. 09/2014 MV: No ischemia; e. 03/2018 MV [place]): no ischemia.  Marland Kitchen GERD (gastroesophageal reflux disease)   . HLD (hyperlipidemia)   . Hypertension   . Hypothyroidism   . Permanent atrial fibrillation (HCC)    a. Dx ~ 2017. CHA2DS2VASc = 6-->coumadin.   Past Surgical History:  Procedure Laterality Date  . ABDOMINAL HYSTERECTOMY    . APPENDECTOMY    . BACK SURGERY    . CARDIAC CATHETERIZATION     x1 stent;[facility]  . CORONARY STENT PLACEMENT    . JOINT REPLACEMENT Left    hip  . JOINT REPLACEMENT Bilateral    knees  . NISSEN FUNDOPLICATION    . THORACOTOMY    . TOTAL HIP ARTHROPLASTY Right 03/09/2018   Procedure: TOTAL HIP ARTHROPLASTY;  Surgeon: Donato Heinz, MD;  Location: [facility] ORS;  Service: Orthopedics;  Laterality: Right;    Current Meds  Medication Sig  . acetaminophen (TYLENOL) 500 MG tablet Take 1 tablet (500 mg total) by mouth every 6 (six) hours as needed for moderate pain (pain).  Marland Kitchen atorvastatin (LIPITOR) 40 MG tablet Take 1 tablet (40 mg total) by mouth at bedtime.  . Cholecalciferol (VITAMIN D3) 1000 UNITS CAPS Take 1,000 Units by mouth daily.   Marland Kitchen  diltiazem (CARDIZEM CD) 180 MG 24 hr capsule Take 1 capsule (180 mg total) by mouth daily.  . Ferrous Sulfate (IRON) 325 (65 FE) MG TABS Take 325 mg by mouth daily.   . furosemide (LASIX) 20 MG tablet Take 1 tablet (20 mg) by mouth two days a week (Monday and Thursday).  . hydrochlorothiazide (HYDRODIURIL) 12.5 MG tablet Take 1 tablet (12.5 mg total) by mouth daily.  Marland Kitchen levothyroxine (SYNTHROID, LEVOTHROID) 88 MCG tablet Take 88 mcg by mouth daily before breakfast.  . losartan (COZAAR) 50 MG tablet Take 1 tablet (50 mg total) by mouth  daily.  . Melatonin 10 MG TABS Take 10 mg by mouth at bedtime.  . metoprolol tartrate (LOPRESSOR) 100 MG tablet Take 1 tablet (100 mg total) by mouth 2 (two) times daily.  Marland Kitchen omeprazole (PRILOSEC) 20 MG capsule Take 20 mg by mouth daily.  Marland Kitchen spironolactone (ALDACTONE) 25 MG tablet Take 12.5 mg by mouth 2 (two) times a week. Monday and Thursday  . warfarin (COUMADIN) 2 MG tablet Take 1 tablet (2 mg total) by mouth every evening. (Patient taking differently: Take 2 mg by mouth every evening. As directed)    Allergies: Codeine, Nsaids, Ibuprofen, and Prednisone  Social History   Tobacco Use  . Smoking status: Never Smoker  . Smokeless tobacco: Never Used  Substance Use Topics  . Alcohol use: Never  . Drug use: Never    Family History  Problem Relation Age of Onset  . Heart Problems Mother   . Heart attack Mother   . AAA (abdominal aortic aneurysm) Mother   . Hypertension Mother   . Heart Problems Father     Review of Systems: A 12-system review of systems was performed and was negative except as noted in the HPI.  --------------------------------------------------------------------------------------------------  Physical Exam: BP 130/70 (BP Location: Left Arm, Patient Position: Sitting, Cuff Size: Normal)   Pulse 74   Ht 5' (1.524 m)   Wt 145 lb 2 oz (65.8 kg)   LMP  (LMP Unknown)   SpO2 97%   BMI 28.34 kg/m   General:  NAD.  Accompanied by her daughter. HEENT: No conjunctival pallor or scleral icterus. Facemask in place. Neck: JVP~6 cm with positive HJR. Lungs: Normal work of breathing. Clear to auscultation bilaterally without wheezes or crackles. Heart: Irregularly irregular rhythm with 2/6 systolic murmur. Abd: Bowel sounds present. Soft, NT/ND without hepatosplenomegaly Ext: No lower extremity edema.  EKG:  Atrial fibrillation.  Lab Results  Component Value Date   WBC 12.4 (H) 09/28/2018   HGB 12.2 09/28/2018   HCT 38.4 09/28/2018   MCV 89.9 09/28/2018    PLT 200 09/28/2018    Lab Results  Component Value Date   NA 140 09/20/2019   K 4.2 09/20/2019   CL 100 09/20/2019   CO2 24 09/20/2019   BUN 30 (H) 09/20/2019   CREATININE 1.15 (H) 09/20/2019   GLUCOSE 106 (H) 09/20/2019   ALT 15 02/25/2018    No results found for: CHOL, HDL, LDLCALC, LDLDIRECT, TRIG, CHOLHDL  --------------------------------------------------------------------------------------------------  ASSESSMENT AND PLAN: Permanent atrial fibrillation: Ventricular rate is adequately controlled in the office today.  However, I am worried that she may be having episodes of RVR with mild activity, contributing to her exertional dyspnea.  I have recommended that we obtain a 14 day event monitor to assess her heart rate control.  I will defer medication changes today.  If exertional dyspnea remains a problem despite unrevealing event monitor and echocardiogram, it may be  worthwhile to taper metoprolol and escalate diltiazem.  We will continue anticoagulation with warfarin.  Chronic HFpEF: Ms. Hasley has stable NYHA class II symptoms and appears grossly euvolemic on exam today.  We will continue her current diuretic regimen of twice weekly furosemide and spironolactone, as dose escalation in the past has led to prerenal azotemia.  We will repeat an echo to ensure that her LVEF has not declined and that she has not developed worsening valvular heart disease.  Coronary artery disease: Ms. Karapetian denies chest pain.  It is possible that exertional dyspnea is an anginal equivalent.  However, most recent outside stress test was low risk.  We will continue current medications for secondary prevention and defer repeat ischemia evaluation at this time.  Carotid artery stenosis: Mild by recent Doppler study.  Continue statin therapy and apixaban (in lieu of aspirin).  Hypertension: Blood pressure is upper normal today.  We will continue current medications.  Hyperlipidemia: Continue  atorvastatin 40 mg daily.  We will request recent labs from Dr. Milta Deiters office and plan to repeat a lipid panel when Ms. Bartok returns for her echo.  Follow-up: Return to clinic in 6 weeks.  Yvonne Kendall, MD 12/20/2019 10:09 AM

## 2019-12-20 ENCOUNTER — Other Ambulatory Visit: Payer: Self-pay

## 2019-12-20 ENCOUNTER — Encounter: Payer: Self-pay | Admitting: Internal Medicine

## 2019-12-20 ENCOUNTER — Ambulatory Visit (INDEPENDENT_AMBULATORY_CARE_PROVIDER_SITE_OTHER): Payer: Medicare Other

## 2019-12-20 ENCOUNTER — Ambulatory Visit (INDEPENDENT_AMBULATORY_CARE_PROVIDER_SITE_OTHER): Payer: Medicare Other | Admitting: Internal Medicine

## 2019-12-20 VITALS — BP 130/70 | HR 74 | Ht 60.0 in | Wt 145.1 lb

## 2019-12-20 DIAGNOSIS — I2510 Atherosclerotic heart disease of native coronary artery without angina pectoris: Secondary | ICD-10-CM

## 2019-12-20 DIAGNOSIS — I779 Disorder of arteries and arterioles, unspecified: Secondary | ICD-10-CM

## 2019-12-20 DIAGNOSIS — I4821 Permanent atrial fibrillation: Secondary | ICD-10-CM

## 2019-12-20 DIAGNOSIS — I5032 Chronic diastolic (congestive) heart failure: Secondary | ICD-10-CM

## 2019-12-20 DIAGNOSIS — E785 Hyperlipidemia, unspecified: Secondary | ICD-10-CM

## 2019-12-20 DIAGNOSIS — I480 Paroxysmal atrial fibrillation: Secondary | ICD-10-CM | POA: Diagnosis not present

## 2019-12-20 DIAGNOSIS — I10 Essential (primary) hypertension: Secondary | ICD-10-CM

## 2019-12-20 NOTE — Patient Instructions (Signed)
Medication Instructions:  - Your physician recommends that you continue on your current medications as directed. Please refer to the Current Medication list given to you today.  *If you need a refill on your cardiac medications before your next appointment, please call your pharmacy*   Lab Work: - none ordered  If you have labs (blood work) drawn today and your tests are completely normal, you will receive your results only by: Marland Kitchen MyChart Message (if you have MyChart) OR . A paper copy in the mail If you have any lab test that is abnormal or we need to change your treatment, we will call you to review the results.   Testing/Procedures: - Your physician has requested that you have an echocardiogram (after 01/03/20). Echocardiography is a painless test that uses sound waves to create images of your heart. It provides your doctor with information about the size and shape of your heart and how well your heart's chambers and valves are working. This procedure takes approximately one hour. There are no restrictions for this procedure.  - Your physician has recommended that you wear a 14 day Zio monitor (heart monitor)- placed in clinic today. This monitor is a medical device that records the heart's electrical activity. Doctors most often use these monitors to diagnose arrhythmias. Arrhythmias are problems with the speed or rhythm of the heartbeat. The monitor is a small device applied to your chest. You can wear one while you do your normal daily activities. While wearing this monitor if you have any symptoms to push the button and record what you felt. Once you have worn this monitor for the period of time provider prescribed (Usually 14 days), you will return the monitor device in the postage paid box. Once it is returned they will download the data collected and provide [place] with a report which the provider will then review and we will call you with those results. Important tips:  1. Avoid showering  during the first 24 hours of wearing the monitor. 2. Avoid excessive sweating to help maximize wear time. 3. Do not submerge the device, no hot tubs, and no swimming pools. 4. Keep any lotions or oils away from the patch. 5. After 24 hours you may shower with the patch on. Take brief showers with your back facing the shower head.  6. Do not remove patch once it has been placed because that will interrupt data and decrease adhesive wear time. 7. Push the button when you have any symptoms and write down what you were feeling. 8. Once you have completed wearing your monitor, remove and place into box which has postage paid and place in your outgoing mailbox.  9. If for some reason you have misplaced your box then call our office and we can provide another box and/or mail it off for you.         Follow-Up: At [facility], you and your health needs are our priority.  As part of our continuing mission to provide you with exceptional heart care, we have created designated Provider Care Teams.  These Care Teams include your primary Cardiologist (physician) and Advanced Practice Providers (APPs -  Physician Assistants and Nurse Practitioners) who all work together to provide you with the care you need, when you need it.  We recommend signing up for the patient portal called "MyChart".  Sign up information is provided on this After Visit Summary.  MyChart is used to connect with patients for Virtual Visits (Telemedicine).  Patients are able  to view lab/test results, encounter notes, upcoming appointments, etc.  Non-urgent messages can be sent to your provider as well.   To learn more about what you can do with MyChart, go to [email or web address].    Your next appointment:   6 week(s)  The format for your next appointment:   In Person  Provider:   Dr Harrell Gave End or Murray Hodgkins, NP   Other Instructions N/a   Echocardiogram An echocardiogram is a procedure that uses painless  sound waves (ultrasound) to produce an image of the heart. Images from an echocardiogram can provide important information about:  Signs of coronary artery disease (CAD).  Aneurysm detection. An aneurysm is a weak or damaged part of an artery wall that bulges out from the normal force of blood pumping through the body.  Heart size and shape. Changes in the size or shape of the heart can be associated with certain conditions, including heart failure, aneurysm, and CAD.  Heart muscle function.  Heart valve function.  Signs of a past heart attack.  Fluid buildup around the heart.  Thickening of the heart muscle.  A tumor or infectious growth around the heart valves. Tell a health care provider about:  Any allergies you have.  All medicines you are taking, including vitamins, herbs, eye drops, creams, and over-the-counter medicines.  Any blood disorders you have.  Any surgeries you have had.  Any medical conditions you have.  Whether you are pregnant or may be pregnant. What are the risks? Generally, this is a safe procedure. However, problems may occur, including:  Allergic reaction to dye (contrast) that may be used during the procedure. What happens before the procedure? No specific preparation is needed. You may eat and drink normally. What happens during the procedure?   An IV tube may be inserted into one of your veins.  You may receive contrast through this tube. A contrast is an injection that improves the quality of the pictures from your heart.  A gel will be applied to your chest.  A wand-like tool (transducer) will be moved over your chest. The gel will help to transmit the sound waves from the transducer.  The sound waves will harmlessly bounce off of your heart to allow the heart images to be captured in real-time motion. The images will be recorded on a computer. The procedure may vary among health care providers and hospitals. What happens after the  procedure?  You may return to your normal, everyday life, including diet, activities, and medicines, unless your health care provider tells you not to do that. Summary  An echocardiogram is a procedure that uses painless sound waves (ultrasound) to produce an image of the heart.  Images from an echocardiogram can provide important information about the size and shape of your heart, heart muscle function, heart valve function, and fluid buildup around your heart.  You do not need to do anything to prepare before this procedure. You may eat and drink normally.  After the echocardiogram is completed, you may return to your normal, everyday life, unless your health care provider tells you not to do that. This information is not intended to replace advice given to you by your health care provider. Make sure you discuss any questions you have with your health care provider. Document Revised: 12/29/2018 Document Reviewed: 10/10/2016 Elsevier Patient Education  [place].

## 2020-01-12 ENCOUNTER — Ambulatory Visit: Payer: Medicare Other | Admitting: Podiatry

## 2020-01-17 ENCOUNTER — Other Ambulatory Visit: Payer: Self-pay

## 2020-01-17 ENCOUNTER — Ambulatory Visit (INDEPENDENT_AMBULATORY_CARE_PROVIDER_SITE_OTHER): Payer: Medicare Other

## 2020-01-17 DIAGNOSIS — Z5181 Encounter for therapeutic drug level monitoring: Secondary | ICD-10-CM | POA: Diagnosis not present

## 2020-01-17 DIAGNOSIS — I4891 Unspecified atrial fibrillation: Secondary | ICD-10-CM | POA: Diagnosis not present

## 2020-01-17 LAB — POCT INR: INR: 1.6 — AB (ref 2.0–3.0)

## 2020-01-17 NOTE — Patient Instructions (Signed)
-   take 3 mg today & tomorrow, 2 mg on Friday, then  - on Saturday, resume dosage of warfarin dosage of 1 tablet (2 mg) every day EXCEPT 1/2 TABLET (1 mg) on MONDAYS & FRIDAYS. - recheck INR in 4 weeks.

## 2020-01-25 ENCOUNTER — Ambulatory Visit (INDEPENDENT_AMBULATORY_CARE_PROVIDER_SITE_OTHER): Payer: Medicare Other

## 2020-01-25 ENCOUNTER — Other Ambulatory Visit: Payer: Self-pay

## 2020-01-25 ENCOUNTER — Telehealth: Payer: Self-pay | Admitting: *Deleted

## 2020-01-25 DIAGNOSIS — I5032 Chronic diastolic (congestive) heart failure: Secondary | ICD-10-CM

## 2020-01-25 DIAGNOSIS — I4821 Permanent atrial fibrillation: Secondary | ICD-10-CM

## 2020-01-25 NOTE — Telephone Encounter (Signed)
-----   Message from Karen Kendall, MD sent at 01/25/2020  2:18 PM EDT ----- Please let Karen Cunningham know that her heart is contracting normally.  Some stiffening of her heart is noted, which could contribute to some of her shortness of breath.  There is also mild to moderate leakage of the mitral and tricuspid valves, as well as mild narrowing of the aortic valve.  I suggest that Karen Cunningham try increasing furosemide to 20 mg daily until we follow-up in the office next week.  She should continue the remainder of her medications at their current doses.

## 2020-01-25 NOTE — Telephone Encounter (Signed)
No answer. Left message to call back.   

## 2020-01-26 NOTE — Telephone Encounter (Signed)
No answer. Left message to call back.   

## 2020-01-26 NOTE — Telephone Encounter (Signed)
Patient daughter returning call  °

## 2020-01-30 NOTE — Telephone Encounter (Signed)
Patient has appointment with Dr End scheduled for tomorrow.

## 2020-01-30 NOTE — Progress Notes (Signed)
Follow-up Outpatient Visit Date: 01/31/2020  Primary Care Provider: Margaretann Loveless, MD [address] [place] [address]  Chief Complaint: Follow-up fatigue and shortness of breath  HPI:  Karen Cunningham is a 86 y.o. female with history of coronary artery disease, permanent atrial fibrillation, hypertension, hyperlipidemia, chronic kidney disease, hypothyroidism, and edema, who presents for follow-up of CAD and atrial fibrillation.  I last saw her in late March, at which time she reported stable exertional dyspnea with modest activity.  She was feeling better after addition of diltiazem last year.  We agreed to obtain a 14-day event monitor to exclude periods of rapid ventricular response contributing to her dyspnea.  This showed an average rate of 84 bpm (range 40 to 182 bpm).  Echo demonstrated low normal LVEF.  Today, Karen Cunningham reports that she has noticed some improvement in her energy and exertional dyspnea since taking furosemide regularly.  Leg swelling has also improved.  She is concerned about the need to urinate more frequently with daily furosemide, though this has not been a major problem.  She denies chest pain and lightheadedness.  Palpitations have also improved with escalation of furosemide.  --------------------------------------------------------------------------------------------------  Past Medical History:  Diagnosis Date  . (HFpEF) heart failure with preserved ejection fraction (HCC)    a. 07/2016 Echo: EF 51%; b. 12/2016 Echo: EF 41%. Mod TR, mild PR; c. 09/2017 Echo [place]): EF 62%, Sev BAE. Mod TR, mild PR; d. 03/2019 Echo([facility]): EF 57%, Gr3 DD. Sev dil LA, mild dil RA. Midly dil Asc Ao. Trace AI. Mild to mod TR, mild PR. RVSP .  Marland Kitchen Anemia   . Arthritis   . Carotid arterial disease (HCC)    a. 10/2013 Mod to Sev RICA dzs. Mild dzs in L bulb; b. 10/2013 CTA Carotids: LICA 50p, RICA w/o stenosis.  . CKD (chronic kidney disease), stage III   . Coronary artery disease      a. Several neg MV between 2008 and 2010; b. 2010 s/p PCI/DES to the mLAD; c. 10/2013 MV: Fixed apical defect, likely breast attenuation, no ischemia; d. 09/2014 MV: No ischemia; e. 03/2018 MV [place]): no ischemia.  Marland Kitchen GERD (gastroesophageal reflux disease)   . HLD (hyperlipidemia)   . Hypertension   . Hypothyroidism   . Permanent atrial fibrillation (HCC)    a. Dx ~ 2017. CHA2DS2VASc = 6-->coumadin.   Past Surgical History:  Procedure Laterality Date  . ABDOMINAL HYSTERECTOMY    . APPENDECTOMY    . BACK SURGERY    . CARDIAC CATHETERIZATION     x1 stent;[facility]  . CORONARY STENT PLACEMENT    . JOINT REPLACEMENT Left    hip  . JOINT REPLACEMENT Bilateral    knees  . NISSEN FUNDOPLICATION    . THORACOTOMY    . TOTAL HIP ARTHROPLASTY Right 03/09/2018   Procedure: TOTAL HIP ARTHROPLASTY;  Surgeon: Donato Heinz, MD;  Location: [facility] ORS;  Service: Orthopedics;  Laterality: Right;    Current Meds  Medication Sig  . acetaminophen (TYLENOL) 500 MG tablet Take 1 tablet (500 mg total) by mouth every 6 (six) hours as needed for moderate pain (pain).  Marland Kitchen atorvastatin (LIPITOR) 40 MG tablet Take 1 tablet (40 mg total) by mouth at bedtime.  . Cholecalciferol (VITAMIN D3) 1000 UNITS CAPS Take 1,000 Units by mouth daily.   Marland Kitchen diltiazem (CARDIZEM CD) 180 MG 24 hr capsule Take 1 capsule (180 mg total) by mouth daily.  . Ferrous Sulfate (IRON) 325 (65 FE) MG TABS  Take 325 mg by mouth daily.   . furosemide (LASIX) 20 MG tablet Take 1 tablet (20 mg total) by mouth daily. Take 1 tablet (20 mg) by mouth two days a week (Monday and Thursday).  . hydrochlorothiazide (HYDRODIURIL) 12.5 MG tablet Take 1 tablet (12.5 mg total) by mouth daily.  Marland Kitchen levothyroxine (SYNTHROID, LEVOTHROID) 88 MCG tablet Take 88 mcg by mouth daily before breakfast.  . losartan (COZAAR) 50 MG tablet Take 1 tablet (50 mg total) by mouth daily.  . Melatonin 10 MG TABS Take 10 mg by mouth at bedtime.  . metoprolol tartrate (LOPRESSOR) 100 MG  tablet Take 1 tablet (100 mg total) by mouth 2 (two) times daily.  Marland Kitchen omeprazole (PRILOSEC) 20 MG capsule Take 20 mg by mouth daily.  Marland Kitchen spironolactone (ALDACTONE) 25 MG tablet Take 12.5 mg by mouth 2 (two) times a week. Monday and Thursday  . warfarin (COUMADIN) 2 MG tablet Take 1 tablet (2 mg total) by mouth every evening. (Patient taking differently: Take 2 mg by mouth every evening. As directed)  . [DISCONTINUED] furosemide (LASIX) 20 MG tablet Take 1 tablet (20 mg) by mouth two days a week (Monday and Thursday).    Allergies: Codeine, Nsaids, Ibuprofen, and Prednisone  Social History   Tobacco Use  . Smoking status: Never Smoker  . Smokeless tobacco: Never Used  Substance Use Topics  . Alcohol use: Never  . Drug use: Never    Family History  Problem Relation Age of Onset  . Heart Problems Mother   . Heart attack Mother   . AAA (abdominal aortic aneurysm) Mother   . Hypertension Mother   . Heart Problems Father     Review of Systems: A 12-system review of systems was performed and was negative except as noted in the HPI.  --------------------------------------------------------------------------------------------------  Physical Exam: BP 122/68 (BP Location: Left Arm, Patient Position: Sitting, Cuff Size: Normal)   Pulse 67   Ht 5\' 1"  (1.549 m)   Wt 144 lb 6 oz (65.5 kg)   LMP  (LMP Unknown)   SpO2 96%   BMI 27.28 kg/m   General: NAD.  Accompanied by her daughter. Neck: No JVD or HJR. Lungs: Clear to auscultation without wheezes or crackles. Heart: Irregularly irregular with 2/6 systolic murmur. Abdomen: Soft, nontender, nondistended. Extremities: No lower extremity edema.  EKG: Atrial fibrillation (ventricular rate 67 bpm).  Otherwise, no significant abnormality or change from prior tracing on 12/20/2019.  Lab Results  Component Value Date   WBC 12.4 (H) 09/28/2018   HGB 12.2 09/28/2018   HCT 38.4 09/28/2018   MCV 89.9 09/28/2018   PLT 200 09/28/2018     Lab Results  Component Value Date   NA 140 09/20/2019   K 4.2 09/20/2019   CL 100 09/20/2019   CO2 24 09/20/2019   BUN 30 (H) 09/20/2019   CREATININE 1.15 (H) 09/20/2019   GLUCOSE 106 (H) 09/20/2019   ALT 15 02/25/2018    --------------------------------------------------------------------------------------------------  ASSESSMENT AND PLAN: Permanent atrial fibrillation: Ventricular rate remains well controlled at rest today.  Recent event monitor also showed only a few brief episodes of significant RVR with activity.  We have agreed to continue warfarin as well as current regimen of metoprolol and diltiazem.  Chronic HFpEF: Ms. Driskill appears euvolemic and reports improvement in her fatigue and exertional dyspnea with daily dosing of furosemide.  We will plan to continue this indefinitely.  I will check a basic metabolic panel today to ensure stable renal function and  electrolytes.  I anticipate continuing twice weekly spironolactone unless hypokalemia is noted.  Valvular heart disease: Recent echo showed mild aortic stenosis, mild mitral regurgitation, and mild to moderate tricuspid regurgitation.  We will continue clinical follow-up and management of her chronic HFpEF, as above.  Hyperlipidemia: Continue atorvastatin 40 mg daily, with ongoing management per Dr. Humphrey Rolls.  Follow-up: Return to clinic in 3 months.   Nelva Bush, MD 01/31/2020 11:40 AM

## 2020-01-30 NOTE — Telephone Encounter (Signed)
No answer. Left message to call back.   

## 2020-01-31 ENCOUNTER — Other Ambulatory Visit: Payer: Self-pay

## 2020-01-31 ENCOUNTER — Encounter: Payer: Self-pay | Admitting: Internal Medicine

## 2020-01-31 ENCOUNTER — Ambulatory Visit (INDEPENDENT_AMBULATORY_CARE_PROVIDER_SITE_OTHER): Payer: Medicare Other | Admitting: Internal Medicine

## 2020-01-31 VITALS — BP 122/68 | HR 67 | Ht 61.0 in | Wt 144.4 lb

## 2020-01-31 DIAGNOSIS — E785 Hyperlipidemia, unspecified: Secondary | ICD-10-CM

## 2020-01-31 DIAGNOSIS — I10 Essential (primary) hypertension: Secondary | ICD-10-CM | POA: Diagnosis not present

## 2020-01-31 DIAGNOSIS — I4821 Permanent atrial fibrillation: Secondary | ICD-10-CM

## 2020-01-31 DIAGNOSIS — I5032 Chronic diastolic (congestive) heart failure: Secondary | ICD-10-CM | POA: Diagnosis not present

## 2020-01-31 DIAGNOSIS — I38 Endocarditis, valve unspecified: Secondary | ICD-10-CM | POA: Diagnosis not present

## 2020-01-31 MED ORDER — FUROSEMIDE 20 MG PO TABS: 20.0000 mg | ORAL_TABLET | Freq: Every day | ORAL | 1 refills | Status: DC

## 2020-01-31 NOTE — Patient Instructions (Signed)
Medication Instructions:  Your physician has recommended you make the following change in your medication:  1- CONTINUE taking Furosemide 20 mg by mouth daily.  *If you need a refill on your cardiac medications before your next appointment, please call your pharmacy*   Lab Work: Your physician recommends that you return for lab work in: TODAY - BMET.   If you have labs (blood work) drawn today and your tests are completely normal, you will receive your results only by: Marland Kitchen MyChart Message (if you have MyChart) OR . A paper copy in the mail If you have any lab test that is abnormal or we need to change your treatment, we will call you to review the results.   Testing/Procedures: none   Follow-Up: At [facility], you and your health needs are our priority.  As part of our continuing mission to provide you with exceptional heart care, we have created designated Provider Care Teams.  These Care Teams include your primary Cardiologist (physician) and Advanced Practice Providers (APPs -  Physician Assistants and Nurse Practitioners) who all work together to provide you with the care you need, when you need it.  We recommend signing up for the patient portal called "MyChart".  Sign up information is provided on this After Visit Summary.  MyChart is used to connect with patients for Virtual Visits (Telemedicine).  Patients are able to view lab/test results, encounter notes, upcoming appointments, etc.  Non-urgent messages can be sent to your provider as well.   To learn more about what you can do with MyChart, go to [email or web address].    Your next appointment:   3 month(s)  The format for your next appointment:   In Person  Provider:    You may see Yvonne Kendall, MD or one of the following Advanced Practice Providers on your designated Care Team:    Nicolasa Ducking, NP  Eula Listen, PA-C  Marisue Ivan, PA-C

## 2020-01-31 NOTE — Telephone Encounter (Signed)
Patient and daughter made aware at OV today.

## 2020-02-01 LAB — BASIC METABOLIC PANEL
BUN/Creatinine Ratio: 20 (ref 12–28)
BUN: 22 mg/dL (ref 8–27)
CO2: 22 mmol/L (ref 20–29)
Calcium: 9.3 mg/dL (ref 8.7–10.3)
Chloride: 100 mmol/L (ref 96–106)
Creatinine, Ser: 1.09 mg/dL — ABNORMAL HIGH (ref 0.57–1.00)
GFR calc Af Amer: 53 mL/min/{1.73_m2} — ABNORMAL LOW (ref >59)
GFR calc non Af Amer: 46 mL/min/{1.73_m2} — ABNORMAL LOW (ref >59)
Glucose: 107 mg/dL — ABNORMAL HIGH (ref 65–99)
Potassium: 4.1 mmol/L (ref 3.5–5.2)
Sodium: 139 mmol/L (ref 134–144)

## 2020-02-14 ENCOUNTER — Other Ambulatory Visit: Payer: Self-pay

## 2020-02-14 ENCOUNTER — Ambulatory Visit (INDEPENDENT_AMBULATORY_CARE_PROVIDER_SITE_OTHER): Payer: Medicare Other

## 2020-02-14 DIAGNOSIS — I4891 Unspecified atrial fibrillation: Secondary | ICD-10-CM

## 2020-02-14 DIAGNOSIS — Z5181 Encounter for therapeutic drug level monitoring: Secondary | ICD-10-CM

## 2020-02-14 LAB — POCT INR: INR: 2 (ref 2.0–3.0)

## 2020-02-14 NOTE — Patient Instructions (Signed)
-   continue dosage of warfarin dosage of 1 tablet (2 mg) every day EXCEPT 1/2 TABLET (1 mg) on MONDAYS & FRIDAYS. - recheck INR in 5 weeks.

## 2020-03-15 ENCOUNTER — Ambulatory Visit (INDEPENDENT_AMBULATORY_CARE_PROVIDER_SITE_OTHER): Payer: Medicare Other | Admitting: Podiatry

## 2020-03-15 ENCOUNTER — Other Ambulatory Visit: Payer: Self-pay

## 2020-03-15 DIAGNOSIS — M79676 Pain in unspecified toe(s): Secondary | ICD-10-CM | POA: Diagnosis not present

## 2020-03-15 DIAGNOSIS — B351 Tinea unguium: Secondary | ICD-10-CM | POA: Diagnosis not present

## 2020-03-15 NOTE — Progress Notes (Signed)
   SUBJECTIVE Patient presents to office today complaining of elongated, thickened nails that cause pain while ambulating in shoes. She is unable to trim her own nails. Patient is here for further evaluation and treatment.  Past Medical History:  Diagnosis Date  . (HFpEF) heart failure with preserved ejection fraction (HCC)    a. 07/2016 Echo: EF 51%; b. 12/2016 Echo: EF 41%. Mod TR, mild PR; c. 09/2017 Echo [facility]): EF 62%, Sev BAE. Mod TR, mild PR; d. 03/2019 Echo([facility]): EF 57%, Gr3 DD. Sev dil LA, mild dil RA. Midly dil Asc Ao. Trace AI. Mild to mod TR, mild PR. RVSP .  Marland Kitchen Anemia   . Arthritis   . Carotid arterial disease (HCC)    a. 10/2013 Mod to Sev RICA dzs. Mild dzs in L bulb; b. 10/2013 CTA Carotids: LICA 50p, RICA w/o stenosis.  . CKD (chronic kidney disease), stage III   . Coronary artery disease    a. Several neg MV between 2008 and 2010; b. 2010 s/p PCI/DES to the mLAD; c. 10/2013 MV: Fixed apical defect, likely breast attenuation, no ischemia; d. 09/2014 MV: No ischemia; e. 03/2018 MV [facility] Flakes): no ischemia.  Marland Kitchen GERD (gastroesophageal reflux disease)   . HLD (hyperlipidemia)   . Hypertension   . Hypothyroidism   . Permanent atrial fibrillation (HCC)    a. Dx ~ 2017. CHA2DS2VASc = 6-->coumadin.    OBJECTIVE General Patient is awake, alert, and oriented x 3 and in no acute distress. Derm Skin is dry and supple bilateral. Negative open lesions or macerations. Remaining integument unremarkable. Nails are tender, long, thickened and dystrophic with subungual debris, consistent with onychomycosis, 1-5 bilateral. No signs of infection noted. Vasc  DP and PT pedal pulses palpable bilaterally. Temperature gradient within normal limits.  Neuro Epicritic and protective threshold sensation grossly intact bilaterally.  Musculoskeletal Exam No symptomatic pedal deformities noted bilateral. Muscular strength within normal limits.  ASSESSMENT 1. Onychodystrophic nails 1-5 bilateral with  hyperkeratosis of nails.  2. Onychomycosis of nail due to dermatophyte bilateral 3. Pain in foot bilateral  PLAN OF CARE 1. Patient evaluated today.  2. Instructed to maintain good pedal hygiene and foot care.  3. Mechanical debridement of nails 1-5 bilaterally performed using a nail nipper. Filed with dremel without incident.  4. Return to clinic in 3 mos.   Call her Karen Cunningham.    Felecia Shelling, DPM [facility]  Dr. Felecia Shelling, DPM    [address]                                        [place], [place] [address]                Office [phone]  Fax [phone]

## 2020-03-20 ENCOUNTER — Other Ambulatory Visit: Payer: Self-pay

## 2020-03-20 ENCOUNTER — Ambulatory Visit (INDEPENDENT_AMBULATORY_CARE_PROVIDER_SITE_OTHER): Payer: Medicare Other

## 2020-03-20 DIAGNOSIS — Z5181 Encounter for therapeutic drug level monitoring: Secondary | ICD-10-CM | POA: Diagnosis not present

## 2020-03-20 DIAGNOSIS — I4891 Unspecified atrial fibrillation: Secondary | ICD-10-CM | POA: Diagnosis not present

## 2020-03-20 LAB — POCT INR: INR: 4.3 — AB (ref 2.0–3.0)

## 2020-03-20 NOTE — Patient Instructions (Signed)
-   HAVE A LARGE SERVING OF GREENS TODAY - SKIP WARFARIN TONIGHT & TOMORROW -  then, resume dosage of warfarin dosage of 1 tablet (2 mg) every day EXCEPT 1/2 TABLET (1 mg) on MONDAYS & FRIDAYS. - recheck INR in 3 weeks.

## 2020-04-10 ENCOUNTER — Ambulatory Visit (INDEPENDENT_AMBULATORY_CARE_PROVIDER_SITE_OTHER): Payer: Medicare Other

## 2020-04-10 ENCOUNTER — Other Ambulatory Visit: Payer: Self-pay

## 2020-04-10 DIAGNOSIS — Z5181 Encounter for therapeutic drug level monitoring: Secondary | ICD-10-CM

## 2020-04-10 DIAGNOSIS — I4891 Unspecified atrial fibrillation: Secondary | ICD-10-CM

## 2020-04-10 LAB — POCT INR: INR: 1.9 — AB (ref 2.0–3.0)

## 2020-04-10 NOTE — Patient Instructions (Signed)
-   Take extra 1/2 tablet tomorrow -  then, resume dosage of warfarin dosage of 1 tablet (2 mg) every day EXCEPT 1/2 TABLET (1 mg) on MONDAYS & FRIDAYS. - recheck INR in 4 weeks.   * Try to see if you can have your greens on a schedule

## 2020-05-06 ENCOUNTER — Other Ambulatory Visit: Payer: Self-pay

## 2020-05-06 ENCOUNTER — Ambulatory Visit (INDEPENDENT_AMBULATORY_CARE_PROVIDER_SITE_OTHER): Payer: Medicare Other | Admitting: Physician Assistant

## 2020-05-06 ENCOUNTER — Ambulatory Visit (INDEPENDENT_AMBULATORY_CARE_PROVIDER_SITE_OTHER): Payer: Medicare Other

## 2020-05-06 ENCOUNTER — Encounter: Payer: Self-pay | Admitting: Physician Assistant

## 2020-05-06 VITALS — BP 108/70 | HR 77 | Ht 61.0 in | Wt 140.0 lb

## 2020-05-06 DIAGNOSIS — Z5181 Encounter for therapeutic drug level monitoring: Secondary | ICD-10-CM

## 2020-05-06 DIAGNOSIS — I4821 Permanent atrial fibrillation: Secondary | ICD-10-CM | POA: Diagnosis not present

## 2020-05-06 DIAGNOSIS — I4891 Unspecified atrial fibrillation: Secondary | ICD-10-CM | POA: Diagnosis not present

## 2020-05-06 DIAGNOSIS — I2510 Atherosclerotic heart disease of native coronary artery without angina pectoris: Secondary | ICD-10-CM | POA: Diagnosis not present

## 2020-05-06 DIAGNOSIS — I10 Essential (primary) hypertension: Secondary | ICD-10-CM | POA: Diagnosis not present

## 2020-05-06 DIAGNOSIS — I5032 Chronic diastolic (congestive) heart failure: Secondary | ICD-10-CM

## 2020-05-06 DIAGNOSIS — E785 Hyperlipidemia, unspecified: Secondary | ICD-10-CM

## 2020-05-06 LAB — POCT INR: INR: 2.2 (ref 2.0–3.0)

## 2020-05-06 NOTE — Patient Instructions (Signed)
-   Continue dosage of warfarin dosage of 1 tablet (2 mg) every day EXCEPT 1/2 TABLET (1 mg) on MONDAYS & FRIDAYS. - recheck INR in 4 weeks.   * Try to see if you can have your greens on a schedule

## 2020-05-06 NOTE — Patient Instructions (Signed)
Medication Instructions:  Your physician recommends that you continue on your current medications as directed. Please refer to the Current Medication list given to you today.  Please do not exceed 4 Tylenol tablets daily.  *If you need a refill on your cardiac medications before your next appointment, please call your pharmacy*   Lab Work: None ordered If you have labs (blood work) drawn today and your tests are completely normal, you will receive your results only by: Marland Kitchen MyChart Message (if you have MyChart) OR . A paper copy in the mail If you have any lab test that is abnormal or we need to change your treatment, we will call you to review the results.   Testing/Procedures: None ordered   Follow-Up: At [facility], you and your health needs are our priority.  As part of our continuing mission to provide you with exceptional heart care, we have created designated Provider Care Teams.  These Care Teams include your primary Cardiologist (physician) and Advanced Practice Providers (APPs -  Physician Assistants and Nurse Practitioners) who all work together to provide you with the care you need, when you need it.  We recommend signing up for the patient portal called "MyChart".  Sign up information is provided on this After Visit Summary.  MyChart is used to connect with patients for Virtual Visits (Telemedicine).  Patients are able to view lab/test results, encounter notes, upcoming appointments, etc.  Non-urgent messages can be sent to your provider as well.   To learn more about what you can do with MyChart, go to [email or web address].    Your next appointment:   Follow up with Dr. Okey Dupre as planned  The format for your next appointment:   In Person  Provider:    You may see Yvonne Kendall, MD or one of the following Advanced Practice Providers on your designated Care Team:    Nicolasa Ducking, NP  Eula Listen, PA-C  Marisue Ivan, PA-C    Other Instructions N/A

## 2020-05-06 NOTE — Progress Notes (Addendum)
Office Visit    Patient Name: MARCELLA CHARLSON Date of Encounter: 05/06/2020  Primary Care Provider:  Margaretann Loveless, MD Primary Cardiologist:  Yvonne Kendall, MD  Chief Complaint    Chief Complaint  Patient presents with  . OTHER    Pain in the back of her hand on her left side that comes and goes, it does penetrate down her neck as well. Medicaitons verbally reviewed with patient and daughter in law.     86 year old female with history of permanent atrial fibrillation on Coumadin, hypertension, hyperlipidemia, hypothyroidism, GERD, CAD s/p stenting of the mid LAD in 2010, carotid artery disease, anemia, arthritis, stage III chronic kidney disease, and HFpEF, and who presents today for recommendations regarding neck pain management.  Past Medical History    Past Medical History:  Diagnosis Date  . (HFpEF) heart failure with preserved ejection fraction (HCC)    a. 07/2016 Echo: EF 51%; b. 12/2016 Echo: EF 41%. Mod TR, mild PR; c. 09/2017 Echo [facility]): EF 62%, Sev BAE. Mod TR, mild PR; d. 03/2019 Echo([facility]): EF 57%, Gr3 DD. Sev dil LA, mild dil RA. Midly dil Asc Ao. Trace AI. Mild to mod TR, mild PR. RVSP .  Marland Kitchen Anemia   . Arthritis   . Carotid arterial disease (HCC)    a. 10/2013 Mod to Sev RICA dzs. Mild dzs in L bulb; b. 10/2013 CTA Carotids: LICA 50p, RICA w/o stenosis.  . CKD (chronic kidney disease), stage III   . Coronary artery disease    a. Several neg MV between 2008 and 2010; b. 2010 s/p PCI/DES to the mLAD; c. 10/2013 MV: Fixed apical defect, likely breast attenuation, no ischemia; d. 09/2014 MV: No ischemia; e. 03/2018 MV [facility] Fl[facility]): no ischemia.  Marland Kitchen GERD (gastroesophageal reflux disease)   . HLD (hyperlipidemia)   . Hypertension   . Hypothyroidism   . Permanent atrial fibrillation (HCC)    a. Dx ~ 2017. CHA2DS2VASc = 6-->coumadin.   Past Surgical History:  Procedure Laterality Date  . ABDOMINAL HYSTERECTOMY    . APPENDECTOMY    . BACK SURGERY    . CARDIAC  CATHETERIZATION     x1 stent;[facility]  . CORONARY STENT PLACEMENT    . JOINT REPLACEMENT Left    hip  . JOINT REPLACEMENT Bilateral    knees  . NISSEN FUNDOPLICATION    . THORACOTOMY    . TOTAL HIP ARTHROPLASTY Right 03/09/2018   Procedure: TOTAL HIP ARTHROPLASTY;  Surgeon: Donato Heinz, MD;  Location: [facility] ORS;  Service: Orthopedics;  Laterality: Right;    Allergies  Allergies  Allergen Reactions  . Codeine Nausea And Vomiting  . Nsaids     Dr instructed to avoid due to kidney issues   . Ibuprofen Other (See Comments)    Dr instructed to avoid due to kidney issues   . Prednisone Other (See Comments)    ELEVATED BLOOD PRESSURE.    History of Present Illness    MARSELA KUAN is a 86 y.o. female with history of CAD, permanent atrial fibrillation on Coumadin, hypertension, hyperlipidemia, CKD, hypothyroidism, and lower extremity edema, and who presents for follow-up.    She was previously followed by Dr. Welton Flakes and underwent PCI and stenting to the mid LAD in 2010 in the setting of shortness of breath.  She was diagnosed with atrial fibrillation approximately 2017 and initially offered cardioversion; however, she deferred this in favor for rate control.  She has been maintained on beta-blocker therapy and Coumadin.  She established with Dr. Okey Dupre 08/2019 in the setting of chronic fatigue and worsening exertional dyspnea.  She reported DOE in the setting of increasing sedentary lifestyle and recent weight gain.  She reported most of her exercise classes being canceled due to COVID-19. She was seen in March 2021 and reported stable DOE with modest activity.  She felt better with addition of diltiazem.  A 14-day event monitor was performed to exclude periods of rapid ventricular response contributing to her dyspnea.  Monitor showed average heart rate 84 bpm with range 40 to 82 bpm.  Echo showed low normal LVEF.  At follow-up, with her primary cardiologist, she noted some improvement in her  energy and exertional dyspnea since taking Lasix regularly.  Lower extremity edema had improved.  She did note concern regarding frequency of urination, though this was not a major concern.  Palpitations had improved with escalation of Lasix.  Of note, she takes spironolactone twice a week due to a slight rise in her creatinine with daily dosing.  Today, she returns to clinic with report of significant neck and head pain.  Given she is on anticoagulation, she wishes to discuss options for pain relief/pain management that do not increase her risk of bleeding.  We reviewed these recommendations in detail.  We also went over alternative methods of pain relief, including alternating ice and heat therapy.  Per her daughter-in-law, she frequently falls asleep while watching television and her head falls forward, so that her chin touches her chest.  Recommendations included to attempt sleeping with an airplane pillow.  We also discussed applying Biofreeze as a possible solution.  She denies chest pain, palpitations, dyspnea, pnd, orthopnea, n, v, dizziness, syncope, edema, weight gain, or early satiety.  She reports medication compliance.  INR checked today during visit.  Labs reviewed from most recent PCP visit and will be scanned into chart.  Patient's daughter-in-law did discuss some concern regarding a recent recommendation to discontinue Synthroid with recommendation that she follow-up with her PCP and most recent TSH being wnl.  Please see scanned in documentation for further labs.  Home Medications    Prior to Admission medications   Medication Sig Start Date End Date Taking? Authorizing Provider  acetaminophen (TYLENOL) 500 MG tablet Take 1 tablet (500 mg total) by mouth every 6 (six) hours as needed for moderate pain (pain). 09/29/18  Yes Wieting, Richard, MD  atorvastatin (LIPITOR) 40 MG tablet Take 1 tablet (40 mg total) by mouth at bedtime. 09/20/19  Yes Creig Hines, NP  Cholecalciferol  (VITAMIN D3) 1000 UNITS CAPS Take 1,000 Units by mouth daily.    Yes [provider]  diltiazem (CARDIZEM CD) 180 MG 24 hr capsule Take 1 capsule (180 mg total) by mouth daily. 08/31/19 12/19/28 Yes End, Cristal Deer, MD  Ferrous Sulfate (IRON) 325 (65 FE) MG TABS Take 325 mg by mouth daily.    Yes [provider]  furosemide (LASIX) 20 MG tablet Take 1 tablet (20 mg total) by mouth daily. Take 1 tablet (20 mg) by mouth two days a week (Monday and Thursday). 01/31/20  Yes End, Cristal Deer, MD  hydrochlorothiazide (HYDRODIURIL) 12.5 MG tablet Take 1 tablet (12.5 mg total) by mouth daily. 09/20/19  Yes Creig Hines, NP  levothyroxine (SYNTHROID, LEVOTHROID) 88 MCG tablet Take 88 mcg by mouth daily before breakfast.   Yes [provider]  losartan (COZAAR) 50 MG tablet Take 1 tablet (50 mg total) by mouth daily. 09/20/19  Yes Creig Hines, NP  Melatonin 10 MG TABS Take 10 mg by mouth at bedtime.   Yes [provider]  metoprolol tartrate (LOPRESSOR) 100 MG tablet Take 1 tablet (100 mg total) by mouth 2 (two) times daily. 09/20/19  Yes Creig HinesBerge, Christopher Ronald, NP  omeprazole (PRILOSEC) 20 MG capsule Take 20 mg by mouth daily.   Yes [provider]  spironolactone (ALDACTONE) 25 MG tablet Take 12.5 mg by mouth 2 (two) times a week. Monday and Thursday   Yes [provider]  warfarin (COUMADIN) 2 MG tablet Take 1 tablet (2 mg total) by mouth every evening. Patient taking differently: Take 2 mg by mouth every evening. As directed 09/20/19  Yes Creig HinesBerge, Christopher Ronald, NP    Review of Systems    She denies chest pain, palpitations, dyspnea, pnd, orthopnea, n, v, dizziness, syncope, edema, weight gain, or early satiety.  She reports neck pain.   All other systems reviewed and are otherwise negative except as noted above.  Physical Exam    VS:  BP 108/70 (BP Location: Left Arm, Patient Position: Sitting, Cuff Size: Normal)    Pulse 77   Ht 5\' 1"  (1.549 m)   Wt 140 lb (63.5 kg)   LMP  (LMP Unknown)   SpO2 97%   BMI 26.45 kg/m  , BMI Body mass index is 26.45 kg/m. GEN: Well nourished, well developed, in no acute distress. HEENT: normal. Neck: Supple, no JVD, carotid bruits, or masses. Cardiac: IRIR with controlled ventricular rate, 2/6 systolic murmur, murmurs, rubs, or gallops. No clubbing, cyanosis, edema.  Radials/DP/PT 2+ and equal bilaterally.  Respiratory:  Respirations regular and unlabored, clear to auscultation bilaterally. GI: Soft, nontender, nondistended, BS + x 4. MS: no deformity or atrophy. Skin: warm and dry, no rash. Neuro:  Strength and sensation are intact. Psych: Normal affect.  Accessory Clinical Findings    ECG personally reviewed by me today - atrial fibrillation with ventricular rate 77bpm, - no acute changes.  VITALS Reviewed today   Temp Readings from Last 3 Encounters:  10/07/18 97.9 F (36.6 C) (Oral)  09/29/18 98.2 F (36.8 C) (Oral)  03/11/18 98.6 F (37 C) (Oral)   BP Readings from Last 3 Encounters:  05/06/20 108/70  01/31/20 122/68  12/20/19 130/70   Pulse Readings from Last 3 Encounters:  05/06/20 77  01/31/20 67  12/20/19 74    Wt Readings from Last 3 Encounters:  05/06/20 140 lb (63.5 kg)  01/31/20 144 lb 6 oz (65.5 kg)  12/20/19 145 lb 2 oz (65.8 kg)     LABS  reviewed today   Lab Results  Component Value Date   WBC 12.4 (H) 09/28/2018   HGB 12.2 09/28/2018   HCT 38.4 09/28/2018   MCV 89.9 09/28/2018   PLT 200 09/28/2018   Lab Results  Component Value Date   CREATININE 1.09 (H) 01/31/2020   BUN 22 01/31/2020   NA 139 01/31/2020   K 4.1 01/31/2020   CL 100 01/31/2020   CO2 22 01/31/2020   Lab Results  Component Value Date   ALT 15 02/25/2018   AST 25 02/25/2018   ALKPHOS 87 02/25/2018   BILITOT 1.5 (H) 02/25/2018   No results found for: CHOL, HDL, LDLCALC, LDLDIRECT, TRIG, CHOLHDL  No results found for: HGBA1C No results found  for: TSH   Recent labs per PCP 11/24/2019   STUDIES/PROCEDURES reviewed today   Bilateral carotids 09/28/2019 Summary:  Right Carotid: Velocities in the right ICA are consistent with a 1-39%  stenosis.         Non-hemodynamically significant plaque <50% noted in the  CCA. The         ECA appears <50% stenosed.   Left Carotid: Velocities in the left ICA are consistent with a 1-39%  stenosis.        Non-hemodynamically significant plaque <50% noted in the  CCA. The        ECA appears <50% stenosed.   Vertebrals: Bilateral vertebral arteries demonstrate antegrade flow.  Subclavians: Normal flow hemodynamics were seen in bilateral subclavian        arteries.   Cardiac monitoring 12/20/2019  Patient was monitored for 14 days.  The predominant rhythm was atrial fibrillation with an average ventricular rate of 84 bpm (range 40 to 182 bpm). Atrial fibrillation burden was 100%.  There were rare PVCs.  A single 9 beat run of nonsustained ventricular tachycardia was observed with a maximal rate of 156 bpm.  There was no prolonged pause.  Patient triggered events correspond to atrial fibrillation and atrial fibrillation with rapid ventricular response, as well as isolated PVCs.   Echocardiogram 01/25/2020 1. Left ventricular ejection fraction, by estimation, is 50 to 55%. The  left ventricle has low normal function. The left ventricle has no regional  wall motion abnormalities. Left ventricular diastolic parameters are  consistent with Grade II diastolic  dysfunction (pseudonormalization).  2. Right ventricular systolic function is normal. The right ventricular  size is normal. There is mildly elevated pulmonary artery systolic  pressure.  3. Left atrial size was severely dilated.  4. Right atrial size was severely dilated.  5. The mitral valve is degenerative. Mild mitral valve regurgitation.  6. Tricuspid valve regurgitation is mild  to moderate.  7. Aortic valve Mean gradient is , peak gradient is , DVI  0.35. The aortic valve is tricuspid. Aortic valve regurgitation is not  visualized. Mild aortic valve stenosis.  8. The inferior vena cava is normal in size with <50% respiratory  variability, suggesting right atrial pressure of 8 mmHg.     Assessment & Plan    Neck pain /head pain, pain management --Visit today to discuss management of neck and head pain given on Coumadin.  She is taking Tylenol multiple times a day for her pain.   It was discussed that she may continue taking Tylenol though at the current recommended rate of 500 mg every 6 hours as needed for pain and cautioned against exceeding this dose. Discussed alternative medicine options and ways to eliminate the likely etiology of her neck pain, which may be that she falls asleep in the recliner and her neck falls forward.  She will try heat and ice, airplane pillow, and Biofreeze as outlined above.  Discussed recommendation that patients taking blood thinners avoid ibuprofen, Advil, Motrin, naproxen, and Aleve due to risk of stomach bleeding.   Permanent atrial fibrillation --Reports asymptomatic.  Ventricular rate well controlled.  Recent event monitor as above with brief episodes of significant RVR with activity.  She is maintained on warfarin with INR today 2.2.  Continue anticoagulation as directed with current regimen of metoprolol and diltiazem.  No medication changes.  Chronic HFpEF --Euvolemic and well compensated.  Weight stable and actually reduced from previous clinic visit BP she reports ongoing improvement of fatigue and exertional dyspnea with current dosing of furosemide.  She has continued on furosemide and HCTZ as tolerated by renal function and potassium. 01/31/2020 labs showed stable potassium and renal function.  She continues on  twice weekly spironolactone.  Continue current losartan.  No medication changes.  Valvular heart  disease --Recent echo as above with mild aortic stenosis, mild MR, mild to moderate TR.  Continue annual echocardiogram and management of her HFpEF as above.  CAD without angina --No reported angina or symptoms concerning for anginal equivalent.  Continue current medications.  HLD --PCP labs reviewed with LDL 71 and just above goal LDL.  Discussed changing medications with patient preference to try lifestyle modification first and recheck her lipids at follow-up.  Per patient's daughter, she has been eating a lot of ice cream lately, which she feels is contributing.  Continue current atorvastatin for now and reassess at RTC.   Total time spent with patient today 30 minutes. This includes reviewing records, evaluating the patient, and coordinating care. Face-to-face time >50%.   Disposition: RTC at already scheduled appointment time 8/27 to follow-up with primary cardiologist.    Lennon Alstrom, PA-C 05/06/2020

## 2020-05-15 ENCOUNTER — Ambulatory Visit: Payer: Medicare Other | Admitting: Internal Medicine

## 2020-05-17 ENCOUNTER — Ambulatory Visit (INDEPENDENT_AMBULATORY_CARE_PROVIDER_SITE_OTHER): Payer: Medicare Other | Admitting: Internal Medicine

## 2020-05-17 ENCOUNTER — Encounter: Payer: Self-pay | Admitting: Internal Medicine

## 2020-05-17 ENCOUNTER — Other Ambulatory Visit: Payer: Self-pay

## 2020-05-17 VITALS — BP 120/62 | HR 71 | Ht 60.0 in | Wt 142.0 lb

## 2020-05-17 DIAGNOSIS — I5032 Chronic diastolic (congestive) heart failure: Secondary | ICD-10-CM

## 2020-05-17 DIAGNOSIS — I4821 Permanent atrial fibrillation: Secondary | ICD-10-CM

## 2020-05-17 DIAGNOSIS — M542 Cervicalgia: Secondary | ICD-10-CM | POA: Diagnosis not present

## 2020-05-17 DIAGNOSIS — I10 Essential (primary) hypertension: Secondary | ICD-10-CM

## 2020-05-17 NOTE — Patient Instructions (Signed)
Medication Instructions:  Your physician recommends that you continue on your current medications as directed. Please refer to the Current Medication list given to you today.  *If you need a refill on your cardiac medications before your next appointment, please call your pharmacy*  Follow-Up: At [facility], you and your health needs are our priority.  As part of our continuing mission to provide you with exceptional heart care, we have created designated Provider Care Teams.  These Care Teams include your primary Cardiologist (physician) and Advanced Practice Providers (APPs -  Physician Assistants and Nurse Practitioners) who all work together to provide you with the care you need, when you need it.  We recommend signing up for the patient portal called "MyChart".  Sign up information is provided on this After Visit Summary.  MyChart is used to connect with patients for Virtual Visits (Telemedicine).  Patients are able to view lab/test results, encounter notes, upcoming appointments, etc.  Non-urgent messages can be sent to your provider as well.   To learn more about what you can do with MyChart, go to [email or web address].    Your next appointment:   4 month(s)  The format for your next appointment:   In Person  Provider:    You may see Christopher End, MD or one of the following Advanced Practice Providers on your designated Care Team:    Christopher Berge, NP  Ryan Dunn, PA-C  Jacquelyn Visser, PA-C  

## 2020-05-17 NOTE — Progress Notes (Signed)
Follow-up Outpatient Visit Date: 05/17/2020  Primary Care Provider: Margaretann Loveless, MD [address] [place] [address]  Chief Complaint: Follow-up neck pain, CAD, and atrial fibrillation  HPI:  Ms. Karen Cunningham is a 86 y.o. female with history of coronary artery disease,permanentatrial fibrillation, hypertension, hyperlipidemia, chronic kidney disease, hypothyroidism, and edema, who presents for follow-up of coronary artery disease and atrial fibrillation.  She was last seen in our office about 1.5 weeks ago by Marisue Ivan, PA, at which time she reported significant neck and head pain.  Continued supportive care was recommended.  She returns today for her previously scheduled follow-up visit.  Today, Ms. Karen Cunningham reports that her left-sided neck and shoulder pain has improved somewhat since her recent visit with Ms. Visser.  Biofreeze seems to be helping.  She inquires about the potential of using NSAIDs to help with her pain.  She was previously prescribed meloxicam but has been leery of using this in the setting of anticoagulation with warfarin.  She is planning to see her PCP in the near future for further assessment of her neck pain.  From a heart standpoint, Ms. Karen Cunningham is doing well without chest pain, palpitations, lightheadedness, or edema.  She has not had any bleeding.  Chronic dyspnea on exertion is unchanged from baseline.  --------------------------------------------------------------------------------------------------  Past Medical History:  Diagnosis Date  . (HFpEF) heart failure with preserved ejection fraction (HCC)    a. 07/2016 Echo: EF 51%; b. 12/2016 Echo: EF 41%. Mod TR, mild PR; c. 09/2017 Echo [facility] Fl[facility]): EF 62%, Sev BAE. Mod TR, mild PR; d. 03/2019 Echo([facility]): EF 57%, Gr3 DD. Sev dil LA, mild dil RA. Midly dil Asc Ao. Trace AI. Mild to mod TR, mild PR. RVSP .  Marland Kitchen Anemia   . Arthritis   . Carotid arterial disease (HCC)    a. 10/2013 Mod to Sev RICA dzs. Mild dzs in L  bulb; b. 10/2013 CTA Carotids: LICA 50p, RICA w/o stenosis.  . CKD (chronic kidney disease), stage III   . Coronary artery disease    a. Several neg MV between 2008 and 2010; b. 2010 s/p PCI/DES to the mLAD; c. 10/2013 MV: Fixed apical defect, likely breast attenuation, no ischemia; d. 09/2014 MV: No ischemia; e. 03/2018 MV Wel[facility] Flakes): no ischemia.  Marland Kitchen GERD (gastroesophageal reflux disease)   . HLD (hyperlipidemia)   . Hypertension   . Hypothyroidism   . Permanent atrial fibrillation (HCC)    a. Dx ~ 2017. CHA2DS2VASc = 6-->coumadin.   Past Surgical History:  Procedure Laterality Date  . ABDOMINAL HYSTERECTOMY    . APPENDECTOMY    . BACK SURGERY    . CARDIAC CATHETERIZATION     x1 stent;[facility]  . CORONARY STENT PLACEMENT    . JOINT REPLACEMENT Left    hip  . JOINT REPLACEMENT Bilateral    knees  . NISSEN FUNDOPLICATION    . THORACOTOMY    . TOTAL HIP ARTHROPLASTY Right 03/09/2018   Procedure: TOTAL HIP ARTHROPLASTY;  Surgeon: Donato Heinz, MD;  Location: [facility] ORS;  Service: Orthopedics;  Laterality: Right;    Current Meds  Medication Sig  . acetaminophen (TYLENOL) 500 MG tablet Take 1 tablet (500 mg total) by mouth every 6 (six) hours as needed for moderate pain (pain).  Marland Kitchen atorvastatin (LIPITOR) 40 MG tablet Take 1 tablet (40 mg total) by mouth at bedtime.  . Cholecalciferol (VITAMIN D3) 1000 UNITS CAPS Take 1,000 Units by mouth daily.   Marland Kitchen diltiazem (CARDIZEM CD) 180 MG 24  hr capsule Take 1 capsule (180 mg total) by mouth daily.  . Ferrous Sulfate (IRON) 325 (65 FE) MG TABS Take 325 mg by mouth daily.   . furosemide (LASIX) 20 MG tablet Take 1 tablet (20 mg total) by mouth daily. Take 1 tablet (20 mg) by mouth two days a week (Monday and Thursday). (Patient taking differently: Take 20 mg by mouth daily. )  . hydrochlorothiazide (HYDRODIURIL) 12.5 MG tablet Take 1 tablet (12.5 mg total) by mouth daily.  Marland Kitchen levothyroxine (SYNTHROID, LEVOTHROID) 88 MCG tablet Take 88 mcg by mouth daily  before breakfast.  . losartan (COZAAR) 50 MG tablet Take 1 tablet (50 mg total) by mouth daily.  . Melatonin 10 MG TABS Take 10 mg by mouth at bedtime.  . metoprolol tartrate (LOPRESSOR) 100 MG tablet Take 1 tablet (100 mg total) by mouth 2 (two) times daily.  Marland Kitchen omeprazole (PRILOSEC) 20 MG capsule Take 20 mg by mouth daily.  Marland Kitchen spironolactone (ALDACTONE) 25 MG tablet Take 12.5 mg by mouth 2 (two) times a week. Monday and Thursday  . warfarin (COUMADIN) 2 MG tablet Take 1 tablet (2 mg total) by mouth every evening. (Patient taking differently: Take 2 mg by mouth every evening. As directed)    Allergies: Codeine, Nsaids, Ibuprofen, and Prednisone  Social History   Tobacco Use  . Smoking status: Never Smoker  . Smokeless tobacco: Never Used  Vaping Use  . Vaping Use: Never used  Substance Use Topics  . Alcohol use: Never  . Drug use: Never    Family History  Problem Relation Age of Onset  . Heart Problems Mother   . Heart attack Mother   . AAA (abdominal aortic aneurysm) Mother   . Hypertension Mother   . Heart Problems Father     Review of Systems: A 12-system review of systems was performed and was negative except as noted in the HPI.  --------------------------------------------------------------------------------------------------  Physical Exam: BP 120/62 (BP Location: Left Arm, Patient Position: Sitting, Cuff Size: Normal)   Pulse 71   Ht 5' (1.524 m)   Wt 142 lb (64.4 kg)   LMP  (LMP Unknown)   BMI 27.73 kg/m   General: NAD. Neck: No JVD or HJR.  Mild tenderness noted along the left lateral neck and shoulder.  No deformity is evident. Lungs: Clear to auscultation without wheeze or crackles. Heart: Irregularly irregular rhythm without murmurs, rubs, or gallops. Abdomen: Soft, nontender, nondistended. Extremities: No lower extremity edema.  EKG: Atrial fibrillation (ventricular rate 71 bpm).  Otherwise, no significant abnormality.  Lab Results  Component Value  Date   WBC 12.4 (H) 09/28/2018   HGB 12.2 09/28/2018   HCT 38.4 09/28/2018   MCV 89.9 09/28/2018   PLT 200 09/28/2018    Lab Results  Component Value Date   NA 139 01/31/2020   K 4.1 01/31/2020   CL 100 01/31/2020   CO2 22 01/31/2020   BUN 22 01/31/2020   CREATININE 1.09 (H) 01/31/2020   GLUCOSE 107 (H) 01/31/2020   ALT 15 02/25/2018   --------------------------------------------------------------------------------------------------  ASSESSMENT AND PLAN: Permanent atrial fibrillation: Ms. Karen Cunningham remains asymptomatic with good ventricular rate control.  We will plan to continue her current regimen of diltiazem 180 mg daily and metoprolol tartrate 100 mg twice daily.  Indefinite anticoagulation with warfarin will also be continued.  Hypertension: Blood pressure is well controlled.  Continue current regimen of diltiazem, metoprolol, HCTZ, losartan, and spironolactone.  Chronic HFpEF: Ms. Karen Cunningham appears euvolemic and is tolerating her current  regimen of daily HCTZ and furosemide as well as twice weekly spironolactone well.  Her renal function and edema have not tolerated significant adjustments of this regimen in the past.  Ongoing laboratory follow-up of renal function and potassium per Dr. Lennette Bihari.  Left neck/shoulder pain: Improving since last visit and most likely musculoskeletal.  I recommend continued symptomatic treatment with Biofreeze and as needed acetaminophen.  We discussed the risks and benefits of NSAID use.  Given the long-term anticoagulation, sporadic use of celecoxib may be the best option for her to minimize the risk for GI bleeding.  We discussed the potential risk for increased cardiovascular events with COX2 inhibitors.  Follow-up: Return to clinic in 4 months.  Yvonne Kendall, MD 05/17/2020 4:40 PM

## 2020-05-18 ENCOUNTER — Encounter: Payer: Self-pay | Admitting: Internal Medicine

## 2020-05-18 DIAGNOSIS — M542 Cervicalgia: Secondary | ICD-10-CM | POA: Insufficient documentation

## 2020-06-03 ENCOUNTER — Other Ambulatory Visit: Payer: Self-pay

## 2020-06-03 ENCOUNTER — Ambulatory Visit (INDEPENDENT_AMBULATORY_CARE_PROVIDER_SITE_OTHER): Payer: Medicare Other

## 2020-06-03 DIAGNOSIS — Z5181 Encounter for therapeutic drug level monitoring: Secondary | ICD-10-CM | POA: Diagnosis not present

## 2020-06-03 DIAGNOSIS — I4891 Unspecified atrial fibrillation: Secondary | ICD-10-CM

## 2020-06-03 LAB — POCT INR: INR: 2.6 (ref 2.0–3.0)

## 2020-06-03 NOTE — Patient Instructions (Addendum)
-   Continue dosage of warfarin dosage of 1 tablet (2 mg) every day EXCEPT 1/2 TABLET (1 mg) on MONDAYS & FRIDAYS. - recheck INR in 4 weeks.   * Try to see if you can have your greens on a schedule 

## 2020-06-18 ENCOUNTER — Ambulatory Visit: Payer: Medicare Other | Admitting: Podiatry

## 2020-07-01 ENCOUNTER — Other Ambulatory Visit: Payer: Self-pay

## 2020-07-01 ENCOUNTER — Ambulatory Visit (INDEPENDENT_AMBULATORY_CARE_PROVIDER_SITE_OTHER): Payer: Medicare Other

## 2020-07-01 DIAGNOSIS — Z5181 Encounter for therapeutic drug level monitoring: Secondary | ICD-10-CM

## 2020-07-01 DIAGNOSIS — I4891 Unspecified atrial fibrillation: Secondary | ICD-10-CM | POA: Diagnosis not present

## 2020-07-01 LAB — POCT INR: INR: 1.6 — AB (ref 2.0–3.0)

## 2020-07-01 NOTE — Patient Instructions (Addendum)
-   take 1 whole tablet wafarin tonight, 1.5 tablets tomorrow, then  - resume dosage of warfarin dosage of 1 tablet (2 mg) every day EXCEPT 1/2 TABLET (1 mg) on MONDAYS & FRIDAYS. - recheck INR in 3 weeks.   * Try to see if you can have your greens on a schedule

## 2020-07-22 ENCOUNTER — Other Ambulatory Visit: Payer: Self-pay

## 2020-07-22 ENCOUNTER — Ambulatory Visit (INDEPENDENT_AMBULATORY_CARE_PROVIDER_SITE_OTHER): Payer: Medicare Other

## 2020-07-22 DIAGNOSIS — I4891 Unspecified atrial fibrillation: Secondary | ICD-10-CM | POA: Diagnosis not present

## 2020-07-22 DIAGNOSIS — Z5181 Encounter for therapeutic drug level monitoring: Secondary | ICD-10-CM

## 2020-07-22 LAB — POCT INR: INR: 1.8 — AB (ref 2.0–3.0)

## 2020-07-22 NOTE — Patient Instructions (Signed)
-   START NEW DOSAGE of warfarin of 1 tablet (2 mg) every day.  - recheck INR in 2 weeks.   * Try to see if you can have your greens on a schedule

## 2020-07-30 ENCOUNTER — Other Ambulatory Visit: Payer: Self-pay

## 2020-07-30 ENCOUNTER — Ambulatory Visit (INDEPENDENT_AMBULATORY_CARE_PROVIDER_SITE_OTHER): Payer: Medicare Other | Admitting: Podiatry

## 2020-07-30 DIAGNOSIS — M79609 Pain in unspecified limb: Secondary | ICD-10-CM | POA: Diagnosis not present

## 2020-07-30 DIAGNOSIS — B351 Tinea unguium: Secondary | ICD-10-CM

## 2020-07-30 NOTE — Progress Notes (Signed)
   SUBJECTIVE Patient presents to office today complaining of elongated, thickened nails that cause pain while ambulating in shoes. She is unable to trim her own nails. Patient is here for further evaluation and treatment.  Past Medical History:  Diagnosis Date  . (HFpEF) heart failure with preserved ejection fraction (HCC)    a. 07/2016 Echo: EF 51%; b. 12/2016 Echo: EF 41%. Mod TR, mild PR; c. 09/2017 Echo [facility]): EF 62%, Sev BAE. Mod TR, mild PR; d. 03/2019 Echo([facility]): EF 57%, Gr3 DD. Sev dil LA, mild dil RA. Midly dil Asc Ao. Trace AI. Mild to mod TR, mild PR. RVSP .  Marland Kitchen Anemia   . Arthritis   . Carotid arterial disease (HCC)    a. 10/2013 Mod to Sev RICA dzs. Mild dzs in L bulb; b. 10/2013 CTA Carotids: LICA 50p, RICA w/o stenosis.  . CKD (chronic kidney disease), stage III   . Coronary artery disease    a. Several neg MV between 2008 and 2010; b. 2010 s/p PCI/DES to the mLAD; c. 10/2013 MV: Fixed apical defect, likely breast attenuation, no ischemia; d. 09/2014 MV: No ischemia; e. 03/2018 MV [facility] Flakes): no ischemia.  Marland Kitchen GERD (gastroesophageal reflux disease)   . HLD (hyperlipidemia)   . Hypertension   . Hypothyroidism   . Permanent atrial fibrillation (HCC)    a. Dx ~ 2017. CHA2DS2VASc = 6-->coumadin.    OBJECTIVE General Patient is awake, alert, and oriented x 3 and in no acute distress. Derm Skin is dry and supple bilateral. Negative open lesions or macerations. Remaining integument unremarkable. Nails are tender, long, thickened and dystrophic with subungual debris, consistent with onychomycosis, 1-5 bilateral. No signs of infection noted. Vasc  DP and PT pedal pulses palpable bilaterally. Temperature gradient within normal limits.  Neuro Epicritic and protective threshold sensation grossly intact bilaterally.  Musculoskeletal Exam No symptomatic pedal deformities noted bilateral. Muscular strength within normal limits.  ASSESSMENT 1. Onychodystrophic nails 1-5 bilateral with  hyperkeratosis of nails.  2. Onychomycosis of nail due to dermatophyte bilateral 3. Pain in foot bilateral  PLAN OF CARE 1. Patient evaluated today.  2. Instructed to maintain good pedal hygiene and foot care.  3. Mechanical debridement of nails 1-5 bilaterally performed using a nail nipper. Filed with dremel without incident.  4. Return to clinic in 3 mos.   Call her Miss Dejuana.    Felecia Shelling, DPM [facility]  Dr. Felecia Shelling, DPM    [address] [place], [place] [address]                Office [phone]  Fax [phone]

## 2020-08-07 ENCOUNTER — Other Ambulatory Visit: Payer: Self-pay

## 2020-08-07 ENCOUNTER — Ambulatory Visit (INDEPENDENT_AMBULATORY_CARE_PROVIDER_SITE_OTHER): Payer: Medicare Other

## 2020-08-07 DIAGNOSIS — Z5181 Encounter for therapeutic drug level monitoring: Secondary | ICD-10-CM

## 2020-08-07 DIAGNOSIS — I4891 Unspecified atrial fibrillation: Secondary | ICD-10-CM | POA: Diagnosis not present

## 2020-08-07 LAB — POCT INR: INR: 2.6 (ref 2.0–3.0)

## 2020-08-07 NOTE — Patient Instructions (Signed)
-   continue dosage of warfarin of 1 tablet (2 mg) every day.  - recheck INR in 3 weeks.   * Try to see if you can have your greens on a schedule 

## 2020-08-14 ENCOUNTER — Other Ambulatory Visit: Payer: Self-pay | Admitting: Internal Medicine

## 2020-08-28 ENCOUNTER — Other Ambulatory Visit: Payer: Self-pay

## 2020-08-28 ENCOUNTER — Ambulatory Visit (INDEPENDENT_AMBULATORY_CARE_PROVIDER_SITE_OTHER): Payer: Medicare Other

## 2020-08-28 DIAGNOSIS — Z5181 Encounter for therapeutic drug level monitoring: Secondary | ICD-10-CM

## 2020-08-28 DIAGNOSIS — I4891 Unspecified atrial fibrillation: Secondary | ICD-10-CM

## 2020-08-28 LAB — POCT INR: INR: 2.7 (ref 2.0–3.0)

## 2020-08-28 NOTE — Patient Instructions (Signed)
-   continue dosage of warfarin of 1 tablet (2 mg) every day.  - recheck INR in 3 weeks.   * Try to see if you can have your greens on a schedule

## 2020-09-17 NOTE — Progress Notes (Signed)
Follow-up Outpatient Visit Date: 09/18/2020  Primary Care Provider: Margaretann Loveless, MD [address] [place] [address]  Chief Complaint: Follow-up shortness of breath and atrial fibrillation  HPI:  Karen Cunningham is a 86 y.o. female with history of coronary artery disease,permanentatrial fibrillation, hypertension, hyperlipidemia, chronic kidney disease, hypothyroidism, and edema, who presents for follow-up of CAD and atrial fibrillation.  I last saw her in late August, at which time she was doing well from a heart standpoint.  Left-sided neck and shoulder pain felt to be musculoskeletal was gradually improving with Biofreeze.  No medication changes or additional testing were pursued.  Today, Karen Cunningham reports that her chronic exertional dyspnea is fairly stable since our last visit and improved since she began seeing [place].  She denies chest pain, palpitations, lightheadedness, and dizziness.  She has not had any bleeding or falls and remains compliant with her anticoagulation.  She does not monitor her blood pressure or heart rate regularly at home.  --------------------------------------------------------------------------------------------------  Past Medical History:  Diagnosis Date  . (HFpEF) heart failure with preserved ejection fraction (HCC)    a. 07/2016 Echo: EF 51%; b. 12/2016 Echo: EF 41%. Mod TR, mild PR; c. 09/2017 Echo [facility]): EF 62%, Sev BAE. Mod TR, mild PR; d. 03/2019 Echo([facility]): EF 57%, Gr3 DD. Sev dil LA, mild dil RA. Midly dil Asc Ao. Trace AI. Mild to mod TR, mild PR. RVSP .  Marland Kitchen Anemia   . Arthritis   . Carotid arterial disease (HCC)    a. 10/2013 Mod to Sev RICA dzs. Mild dzs in L bulb; b. 10/2013 CTA Carotids: LICA 50p, RICA w/o stenosis.  . CKD (chronic kidney disease), stage III (HCC)   . Coronary artery disease    a. Several neg MV between 2008 and 2010; b. 2010 s/p PCI/DES to the mLAD; c. 10/2013 MV: Fixed apical defect, likely breast attenuation, no ischemia;  d. 09/2014 MV: No ischemia; e. 03/2018 MV Wel[facility] Flakes): no ischemia.  Marland Kitchen GERD (gastroesophageal reflux disease)   . HLD (hyperlipidemia)   . Hypertension   . Hypothyroidism   . Permanent atrial fibrillation (HCC)    a. Dx ~ 2017. CHA2DS2VASc = 6-->coumadin.   Past Surgical History:  Procedure Laterality Date  . ABDOMINAL HYSTERECTOMY    . APPENDECTOMY    . BACK SURGERY    . CARDIAC CATHETERIZATION     x1 stent;[facility]  . CORONARY STENT PLACEMENT    . JOINT REPLACEMENT Left    hip  . JOINT REPLACEMENT Bilateral    knees  . NISSEN FUNDOPLICATION    . THORACOTOMY    . TOTAL HIP ARTHROPLASTY Right 03/09/2018   Procedure: TOTAL HIP ARTHROPLASTY;  Surgeon: Donato Heinz, MD;  Location: [facility] ORS;  Service: Orthopedics;  Laterality: Right;    Current Meds  Medication Sig  . acetaminophen (TYLENOL) 500 MG tablet Take 1 tablet (500 mg total) by mouth every 6 (six) hours as needed for moderate pain (pain).  Marland Kitchen atorvastatin (LIPITOR) 40 MG tablet Take 1 tablet (40 mg total) by mouth at bedtime.  . Cholecalciferol (VITAMIN D3) 1000 UNITS CAPS Take 1,000 Units by mouth daily.   Marland Kitchen diltiazem (CARDIZEM CD) 180 MG 24 hr capsule Take 1 capsule by mouth once daily  . Ferrous Sulfate (IRON) 325 (65 FE) MG TABS Take 325 mg by mouth daily.   . furosemide (LASIX) 20 MG tablet Take 20 mg by mouth daily.  . hydrochlorothiazide (HYDRODIURIL) 12.5 MG tablet Take 1 tablet (12.5 mg total)  by mouth daily.  Marland Kitchen levothyroxine (SYNTHROID, LEVOTHROID) 88 MCG tablet Take 88 mcg by mouth daily before breakfast.  . losartan (COZAAR) 50 MG tablet Take 1 tablet (50 mg total) by mouth daily.  . Melatonin 10 MG TABS Take 10 mg by mouth at bedtime.  . metoprolol tartrate (LOPRESSOR) 100 MG tablet Take 1 tablet (100 mg total) by mouth 2 (two) times daily.  Marland Kitchen omeprazole (PRILOSEC) 20 MG capsule Take 20 mg by mouth daily.  Marland Kitchen spironolactone (ALDACTONE) 25 MG tablet Take 12.5 mg by mouth 2 (two) times a week. Monday and Thursday  .  warfarin (COUMADIN) 2 MG tablet Take 1 tablet (2 mg total) by mouth every evening.  . [DISCONTINUED] furosemide (LASIX) 20 MG tablet Take 1 tablet (20 mg total) by mouth daily. Take 1 tablet (20 mg) by mouth two days a week (Monday and Thursday). (Patient taking differently: Take 20 mg by mouth daily.)    Allergies: Codeine, Nsaids, Ibuprofen, and Prednisone  Social History   Tobacco Use  . Smoking status: Never Smoker  . Smokeless tobacco: Never Used  Vaping Use  . Vaping Use: Never used  Substance Use Topics  . Alcohol use: Never  . Drug use: Never    Family History  Problem Relation Age of Onset  . Heart Problems Mother   . Heart attack Mother   . AAA (abdominal aortic aneurysm) Mother   . Hypertension Mother   . Heart Problems Father     Review of Systems: A 12-system review of systems was performed and was negative except as noted in the HPI.  --------------------------------------------------------------------------------------------------  Physical Exam: BP 122/62 (BP Location: Left Arm, Patient Position: Sitting, Cuff Size: Normal)   Pulse 80   Ht 5' (1.524 m)   Wt 142 lb (64.4 kg)   LMP  (LMP Unknown)   SpO2 96%   BMI 27.73 kg/m   General: NAD.  Accompanied by her daughter. Neck: No JVD or HJR. Lungs: Clear to auscultation bilaterally without wheezes or crackles. Heart: Irregularly irregular rhythm without murmurs, rubs, or gallops. Abdomen: Soft, nontender, nondistended. Extremities: No lower extremity edema.  EKG: Atrial fibrillation (ventricular rate 80 bpm).  Otherwise, no significant abnormalities or changes from prior tracing on 05/17/2020.  Lab Results  Component Value Date   WBC 12.4 (H) 09/28/2018   HGB 12.2 09/28/2018   HCT 38.4 09/28/2018   MCV 89.9 09/28/2018   PLT 200 09/28/2018    Lab Results  Component Value Date   NA 139 01/31/2020   K 4.1 01/31/2020   CL 100 01/31/2020   CO2 22 01/31/2020   BUN 22 01/31/2020   CREATININE 1.09  (H) 01/31/2020   GLUCOSE 107 (H) 01/31/2020   ALT 15 02/25/2018    Outside labs (09/10/2020): CMP: Sodium 141, potassium 4.0, chloride 100, CO2 24, BUN 25, creatinine 1.2, glucose 102, calcium 9.9, AST 16, ALT 11, alkaline phosphatase 117, total bilirubin 1.2, total protein 7.3, albumin 4.5  CBC: WBC 9.7, Hgb 13.5, HCT 41.3, PLT 219  Lipid panel: Total cholesterol 156, triglyceride 136, HDL 52, LDL of 80  TSH: 0.505 --------------------------------------------------------------------------------------------------  ASSESSMENT AND PLAN: Permanent atrial fibrillation: Karen Cunningham remains minimally symptomatic with chronic exertional dyspnea that is likely multifactorial and overall improved since I first began seeing her earlier this year.  Her ventricular rate control is good.  We will continue her current regimen of metoprolol and diltiazem as well as indefinite anticoagulation with warfarin in the setting of a CHA2DS2-VASc score of at  least 4.  Hypertension: Blood pressure well controlled today.  Given mild azotemia noted on recent labs by Dr. Welton Flakes, will discontinue HCTZ.  Chronic HFpEF: Karen Cunningham appears euvolemic with stable NYHA class II symptoms.  Given recent labs showing moderately reduced GFR (41) with a creatinine of 1.2, I think it would be reasonable to discontinue HCTZ.  This pain can continue to use furosemide and spironolactone as previously recommended for volume management.  She should monitor her blood pressure at home and let us know if it is consistently above 140/90.  Follow-up: Return to clinic in 3 to 4 months.  Yvonne Kendall, MD 09/18/2020 11:02 AM

## 2020-09-18 ENCOUNTER — Other Ambulatory Visit: Payer: Self-pay

## 2020-09-18 ENCOUNTER — Ambulatory Visit (INDEPENDENT_AMBULATORY_CARE_PROVIDER_SITE_OTHER): Payer: Medicare Other

## 2020-09-18 ENCOUNTER — Ambulatory Visit (INDEPENDENT_AMBULATORY_CARE_PROVIDER_SITE_OTHER): Payer: Medicare Other | Admitting: Internal Medicine

## 2020-09-18 ENCOUNTER — Encounter: Payer: Self-pay | Admitting: Internal Medicine

## 2020-09-18 ENCOUNTER — Telehealth: Payer: Self-pay | Admitting: Internal Medicine

## 2020-09-18 VITALS — BP 122/62 | HR 80 | Ht 60.0 in | Wt 142.0 lb

## 2020-09-18 DIAGNOSIS — I4821 Permanent atrial fibrillation: Secondary | ICD-10-CM

## 2020-09-18 DIAGNOSIS — I4891 Unspecified atrial fibrillation: Secondary | ICD-10-CM

## 2020-09-18 DIAGNOSIS — I10 Essential (primary) hypertension: Secondary | ICD-10-CM | POA: Diagnosis not present

## 2020-09-18 DIAGNOSIS — I5032 Chronic diastolic (congestive) heart failure: Secondary | ICD-10-CM

## 2020-09-18 DIAGNOSIS — Z5181 Encounter for therapeutic drug level monitoring: Secondary | ICD-10-CM | POA: Diagnosis not present

## 2020-09-18 LAB — POCT INR: INR: 2.4 (ref 2.0–3.0)

## 2020-09-18 MED ORDER — ATORVASTATIN CALCIUM 40 MG PO TABS: 40.0000 mg | ORAL_TABLET | Freq: Every day | ORAL | 3 refills | Status: DC

## 2020-09-18 MED ORDER — METOPROLOL TARTRATE 100 MG PO TABS: 100.0000 mg | ORAL_TABLET | Freq: Two times a day (BID) | ORAL | 3 refills | Status: DC

## 2020-09-18 NOTE — Telephone Encounter (Signed)
Spoke to pt's dtr in law on the phone, made her aware that I have just now sent in 90 day refills for both her Metoprolol and Atorvastatin to [facility] on [address].

## 2020-09-18 NOTE — Patient Instructions (Signed)
Medication Instructions:   Your physician has recommended you make the following change in your medication:   STOP taking Hydrochlorothiazide (HCTZ)   *If you need a refill on your cardiac medications before your next appointment, please call your pharmacy*   Lab Work: None ordered   Testing/Procedures: None ordered   Follow-Up: At [facility], you and your health needs are our priority.  As part of our continuing mission to provide you with exceptional heart care, we have created designated Provider Care Teams.  These Care Teams include your primary Cardiologist (physician) and Advanced Practice Providers (APPs -  Physician Assistants and Nurse Practitioners) who all work together to provide you with the care you need, when you need it.  We recommend signing up for the patient portal called "MyChart".  Sign up information is provided on this After Visit Summary.  MyChart is used to connect with patients for Virtual Visits (Telemedicine).  Patients are able to view lab/test results, encounter notes, upcoming appointments, etc.  Non-urgent messages can be sent to your provider as well.   To learn more about what you can do with MyChart, go to [email or web address].    Your next appointment:   3 - 4 month(s)  The format for your next appointment:   In Person  Provider:   You may see Yvonne Kendall, MD or one of the following Advanced Practice Providers on your designated Care Team:    Nicolasa Ducking, NP  Eula Listen, PA-C  Marisue Ivan, PA-C  [place], [place]  Gillian Shields, NP

## 2020-09-18 NOTE — Telephone Encounter (Signed)
*  STAT* If patient is at the pharmacy, call can be transferred to refill team.   1. Which medications need to be refilled? (please list name of each medication and dose if known)  Metoprolol tartrate 100 MG 1 tablet 2 times daily Atorvastatin 40 MG 1 tablet at bedtime   2. Which pharmacy/location (including street and city if local pharmacy) is medication to be sent to? [facility] on [address]   3. Do they need a 30 day or 90 day supply? 90 day

## 2020-09-18 NOTE — Patient Instructions (Signed)
-   continue dosage of warfarin of 1 tablet (2 mg) every day.  °- recheck INR in 4 weeks ° °* Try to see if you can have your greens on a schedule °

## 2020-09-19 MED ORDER — LOSARTAN POTASSIUM 50 MG PO TABS: 50.0000 mg | ORAL_TABLET | Freq: Every day | ORAL | 3 refills | Status: DC

## 2020-09-19 MED ORDER — SPIRONOLACTONE 25 MG PO TABS: 12.5000 mg | ORAL_TABLET | ORAL | 3 refills | Status: DC

## 2020-09-19 MED ORDER — WARFARIN SODIUM 2 MG PO TABS: 2.0000 mg | ORAL_TABLET | Freq: Every evening | ORAL | 3 refills | Status: DC

## 2020-09-20 ENCOUNTER — Encounter: Payer: Self-pay | Admitting: Internal Medicine

## 2020-09-30 MED ORDER — DILTIAZEM HCL ER COATED BEADS 240 MG PO CP24: 240.0000 mg | ORAL_CAPSULE | Freq: Every day | ORAL | 3 refills | Status: DC

## 2020-09-30 NOTE — Telephone Encounter (Signed)
Spoke with Karen Cunningham, ok per DPR. She verbalized understanding of Dr Serita Kyle recommendations to increase Diltiazem ER to 240 mg daily and follow up this week. Appointment date and time scheduled. She was wondering if the discontinuation of the HCTZ could have triggered the aFib. Patient does not have any swelling in her legs, ankles, or feet. She notices her belly seems to look a little bigger than usual and she feels patient has gained some weight and is not eating healthy. She looks forward to appointment to discuss more and will update [place] if they're are any changes.  Rx sent to pharmacy.

## 2020-09-30 NOTE — Telephone Encounter (Signed)
Let's have Ms. Glowacki increase diltiazem ER to 240 mg daily and come in to see me or an APP later this week.  I worry that her symptoms may be driven by a-fib with RVR.  If symptoms worsen in the meantime, she should let us know or go to the ED for further evaluation.  Yvonne Kendall, MD [facility]

## 2020-10-03 ENCOUNTER — Ambulatory Visit (INDEPENDENT_AMBULATORY_CARE_PROVIDER_SITE_OTHER): Payer: Medicare Other | Admitting: Internal Medicine

## 2020-10-03 ENCOUNTER — Other Ambulatory Visit: Payer: Self-pay

## 2020-10-03 ENCOUNTER — Encounter: Payer: Self-pay | Admitting: Internal Medicine

## 2020-10-03 VITALS — BP 150/60 | HR 89 | Ht 60.0 in | Wt 145.2 lb

## 2020-10-03 DIAGNOSIS — I4821 Permanent atrial fibrillation: Secondary | ICD-10-CM | POA: Diagnosis not present

## 2020-10-03 DIAGNOSIS — I10 Essential (primary) hypertension: Secondary | ICD-10-CM

## 2020-10-03 DIAGNOSIS — I38 Endocarditis, valve unspecified: Secondary | ICD-10-CM

## 2020-10-03 DIAGNOSIS — R0602 Shortness of breath: Secondary | ICD-10-CM | POA: Diagnosis not present

## 2020-10-03 DIAGNOSIS — I5032 Chronic diastolic (congestive) heart failure: Secondary | ICD-10-CM

## 2020-10-03 DIAGNOSIS — Z79899 Other long term (current) drug therapy: Secondary | ICD-10-CM

## 2020-10-03 NOTE — Patient Instructions (Signed)
Medication Instructions:  Your physician recommends that you continue on your current medications as directed. Please refer to the Current Medication list given to you today.  If you have increased Shortness of breath, you may increase furosemide to 40 mg by mouth daily for 1-2 days.  *If you need a refill on your cardiac medications before your next appointment, please call your pharmacy*   Lab Work: Your physician recommends that you return for lab work in: TODAY - CBC, BMP, BNP.   If you have labs (blood work) drawn today and your tests are completely normal, you will receive your results only by: Marland Kitchen MyChart Message (if you have MyChart) OR . A paper copy in the mail If you have any lab test that is abnormal or we need to change your treatment, we will call you to review the results.   Testing/Procedures: none  Follow-Up: At [facility], you and your health needs are our priority.  As part of our continuing mission to provide you with exceptional heart care, we have created designated Provider Care Teams.  These Care Teams include your primary Cardiologist (physician) and Advanced Practice Providers (APPs -  Physician Assistants and Nurse Practitioners) who all work together to provide you with the care you need, when you need it.  We recommend signing up for the patient portal called "MyChart".  Sign up information is provided on this After Visit Summary.  MyChart is used to connect with patients for Virtual Visits (Telemedicine).  Patients are able to view lab/test results, encounter notes, upcoming appointments, etc.  Non-urgent messages can be sent to your provider as well.   To learn more about what you can do with MyChart, go to [email or web address].    Your next appointment:   1 month(s)  The format for your next appointment:   In Person  Provider:   You may see Yvonne Kendall, MD or one of the following Advanced Practice Providers on your designated Care Team:     Nicolasa Ducking, NP  Eula Listen, PA-C  Marisue Ivan, PA-C  Cadence Lupus, [place]  Gillian Shields, NP

## 2020-10-03 NOTE — Progress Notes (Signed)
Follow-up Outpatient Visit Date: 10/03/2020  Primary Care Provider: Margaretann Loveless, MD [address] [place] [address]  Chief Complaint: Shortness of breath and elevated blood pressure/heart rate  HPI:  Karen Cunningham is a 87 y.o. female with history of coronary artery disease,permanentatrial fibrillation, hypertension, hyperlipidemia, chronic kidney disease, hypothyroidism, and chronic lower extremity edema, who presents for urgent reevaluation of elevated heart rates and blood pressure.  I last saw Karen Cunningham about 2 weeks ago at which time she was doing well with stable exertional dyspnea and leg edema.  She was in atrial fibrillation with ventricular rate of 80 bpm.  Blood pressure was well controlled at 122/62.  Due to mild azotemia on recent labs by her PCP as well as excellent blood pressure control, we agreed to discontinue HCTZ and continue furosemide and spironolactone for volume management.  Her daughter reached out to [place] earlier this week, noting that her mother's heart rate and blood pressure had been somewhat elevated.  She was advised to increase diltiazem to 240 mg daily and follow-up with [place] today for reevaluation.  Today, Karen Cunningham and her daughter report that Karen Cunningham was doing fairly well after our last visit up until 4 to 5 days ago.  She began feeling more short of breath and jittery.  Home blood pressure and pulse readings were elevated.  Karen Cunningham daughter also felt like her mother was more short of breath with activity.  She had received her COVID-19 booster about a week earlier.  Other associated symptoms were headache and generalized fatigue.  Karen Cunningham does not weigh herself regularly but does not feel like she was retaining more fluid.  Since increasing diltiazem, she has started to feel better with less shortness of breath.  She also feels less jittery.  She has not had any chest pain or lightheadedness.  Home blood pressure readings have typically been normal or  mildly elevated.  One low reading (89/53) was likely due to incorrect sphygmomanometer application.  Karen Cunningham remains compliant with her medications, including warfarin.   --------------------------------------------------------------------------------------------------  Past Medical History:  Diagnosis Date  . (HFpEF) heart failure with preserved ejection fraction (HCC)    a. 07/2016 Echo: EF 51%; b. 12/2016 Echo: EF 41%. Mod TR, mild PR; c. 09/2017 Echo [facility]): EF 62%, Sev BAE. Mod TR, mild PR; d. 03/2019 Echo([facility]): EF 57%, Gr3 DD. Sev dil LA, mild dil RA. Midly dil Asc Ao. Trace AI. Mild to mod TR, mild PR. RVSP .  Marland Kitchen Anemia   . Arthritis   . Carotid arterial disease (HCC)    a. 10/2013 Mod to Sev RICA dzs. Mild dzs in L bulb; b. 10/2013 CTA Carotids: LICA 50p, RICA w/o stenosis.  . CKD (chronic kidney disease), stage III (HCC)   . Coronary artery disease    a. Several neg MV between 2008 and 2010; b. 2010 s/p PCI/DES to the mLAD; c. 10/2013 MV: Fixed apical defect, likely breast attenuation, no ischemia; d. 09/2014 MV: No ischemia; e. 03/2018 MV [facility] [facility]): no ischemia.  Marland Kitchen GERD (gastroesophageal reflux disease)   . HLD (hyperlipidemia)   . Hypertension   . Hypothyroidism   . Permanent atrial fibrillation (HCC)    a. Dx ~ 2017. CHA2DS2VASc = 6-->coumadin.   Past Surgical History:  Procedure Laterality Date  . ABDOMINAL HYSTERECTOMY    . APPENDECTOMY    . BACK SURGERY    . CARDIAC CATHETERIZATION     x1 stent;[facility]  . CORONARY STENT PLACEMENT    .  JOINT REPLACEMENT Left    hip  . JOINT REPLACEMENT Bilateral    knees  . NISSEN FUNDOPLICATION    . THORACOTOMY    . TOTAL HIP ARTHROPLASTY Right 03/09/2018   Procedure: TOTAL HIP ARTHROPLASTY;  Surgeon: Donato Heinz, MD;  Location: [facility] ORS;  Service: Orthopedics;  Laterality: Right;    Current Meds  Medication Sig  . acetaminophen (TYLENOL) 500 MG tablet Take 1 tablet (500 mg total) by mouth every 6 (six) hours as needed for  moderate pain (pain).  Marland Kitchen atorvastatin (LIPITOR) 40 MG tablet Take 1 tablet (40 mg total) by mouth at bedtime.  . Cholecalciferol (VITAMIN D3) 1000 UNITS CAPS Take 1,000 Units by mouth daily.   Marland Kitchen diltiazem (CARDIZEM CD) 240 MG 24 hr capsule Take 1 capsule (240 mg total) by mouth daily.  . Ferrous Sulfate (IRON) 325 (65 FE) MG TABS Take 325 mg by mouth daily.   . furosemide (LASIX) 20 MG tablet Take 20 mg by mouth daily.  Marland Kitchen levothyroxine (SYNTHROID, LEVOTHROID) 88 MCG tablet Take 88 mcg by mouth daily before breakfast.  . losartan (COZAAR) 50 MG tablet Take 1 tablet (50 mg total) by mouth daily.  . Melatonin 10 MG TABS Take 10 mg by mouth at bedtime.  . metoprolol tartrate (LOPRESSOR) 100 MG tablet Take 1 tablet (100 mg total) by mouth 2 (two) times daily.  Marland Kitchen omeprazole (PRILOSEC) 20 MG capsule Take 20 mg by mouth daily.  Marland Kitchen spironolactone (ALDACTONE) 25 MG tablet Take 0.5 tablets (12.5 mg total) by mouth 2 (two) times a week. Monday and Thursday  . warfarin (COUMADIN) 2 MG tablet Take 1 tablet (2 mg total) by mouth every evening.    Allergies: Codeine, Nsaids, Ibuprofen, and Prednisone  Social History   Tobacco Use  . Smoking status: Never Smoker  . Smokeless tobacco: Never Used  Vaping Use  . Vaping Use: Never used  Substance Use Topics  . Alcohol use: Never  . Drug use: Never    Family History  Problem Relation Age of Onset  . Heart Problems Mother   . Heart attack Mother   . AAA (abdominal aortic aneurysm) Mother   . Hypertension Mother   . Heart Problems Father     Review of Systems: A 12-system review of systems was performed and was negative except as noted in the HPI.  --------------------------------------------------------------------------------------------------  Physical Exam: BP (!) 150/60 (BP Location: Right Arm, Patient Position: Sitting, Cuff Size: Normal)   Pulse 89   Ht 5' (1.524 m)   Wt 145 lb 4 oz (65.9 kg)   LMP  (LMP Unknown)   SpO2 97%   BMI  28.37 kg/m   ReDS Vest: 31% (normal)  General:  NAD.  She is accompanied by her daughter. Neck: JVP approximately 8 cm with positive HJR. Lungs: Clear to auscultation bilaterally without wheezes or crackles. Heart: Irregularly irregular rhythm with 1/6 systolic murmur.  No rubs or gallops.. Abdomen: Soft, nontender, nondistended. Extremities: No lower extremity edema.  EKG: Atrial fibrillation (ventricular rate 89 bpm).  Otherwise, no significant abnormality.  Lab Results  Component Value Date   WBC 12.4 (H) 09/28/2018   HGB 12.2 09/28/2018   HCT 38.4 09/28/2018   MCV 89.9 09/28/2018   PLT 200 09/28/2018    Lab Results  Component Value Date   NA 139 01/31/2020   K 4.1 01/31/2020   CL 100 01/31/2020   CO2 22 01/31/2020   BUN 22 01/31/2020   CREATININE 1.09 (H) 01/31/2020  GLUCOSE 107 (H) 01/31/2020   ALT 15 02/25/2018    No results found for: CHOL, HDL, LDLCALC, LDLDIRECT, TRIG, CHOLHDL  --------------------------------------------------------------------------------------------------  ASSESSMENT AND PLAN: Permanent atrial fibrillation: Ventricular rate is reasonably well controlled today.  Ms. pain seems to be feeling better with escalation of diltiazem.  I suspect that higher ventricular rates may have been contributing to some of her symptoms.  This could have been precipitated by generalized malaise associated with recent COVID-19 booster.  We have agreed to defer medication changes today.  Continue indefinite anticoagulation with warfarin.  Chronic HFpEF and shortness of breath: Ms. Votaw does not appear edematous on examination today though her JVP is mildly elevated.  ReDS Vest reading was normal at 31%, arguing against significant pulmonary edema.  I will check a CBC, BMP, and BNP today.  If BNP is significantly elevated, Ms. Aslinger will need to increase her furosemide for 3 days.  I have also advised her to double furosemide to 40 mg daily as needed for increasing  shortness of breath, edema, and weight gain.  We will otherwise continue her current regimen consisting of furosemide 20 mg daily, losartan 50 mg daily, metoprolol tartrate 100 mg twice daily, and spironolactone 12.5 mg on Mondays and Thursdays.  Hypertension: Blood pressure mildly elevated today but typically better at home.  I will defer medication changes at this time, particularly as we may need to temporarily escalate her diuresis, as outlined above.  Valvular heart disease: Ms. Mccance has a history of mild aortic stenosis and mitral regurgitation as well as mild to moderate tricuspid regurgitation.  No medication changes today.  If symptoms worsen, we may need to repeat echocardiogram to reassess her valves.  Follow-up: Return to clinic in 1 month.  Yvonne Kendall, MD 10/03/2020 11:57 AM

## 2020-10-04 ENCOUNTER — Telehealth: Payer: Self-pay | Admitting: Internal Medicine

## 2020-10-04 LAB — BASIC METABOLIC PANEL
BUN/Creatinine Ratio: 16 (ref 12–28)
BUN: 15 mg/dL (ref 8–27)
CO2: 22 mmol/L (ref 20–29)
Calcium: 9.4 mg/dL (ref 8.7–10.3)
Chloride: 105 mmol/L (ref 96–106)
Creatinine, Ser: 0.95 mg/dL (ref 0.57–1.00)
GFR calc Af Amer: 62 mL/min/{1.73_m2} (ref >59)
GFR calc non Af Amer: 54 mL/min/{1.73_m2} — ABNORMAL LOW (ref >59)
Glucose: 114 mg/dL — ABNORMAL HIGH (ref 65–99)
Potassium: 4.7 mmol/L (ref 3.5–5.2)
Sodium: 141 mmol/L (ref 134–144)

## 2020-10-04 LAB — BRAIN NATRIURETIC PEPTIDE: BNP: 474.1 pg/mL — ABNORMAL HIGH (ref 0.0–100.0)

## 2020-10-04 LAB — CBC
Hematocrit: 38.2 % (ref 34.0–46.6)
Hemoglobin: 12.2 g/dL (ref 11.1–15.9)
MCH: 28.6 pg (ref 26.6–33.0)
MCHC: 31.9 g/dL (ref 31.5–35.7)
MCV: 90 fL (ref 79–97)
Platelets: 319 10*3/uL (ref 150–450)
RBC: 4.27 x10E6/uL (ref 3.77–5.28)
RDW: 13.1 % (ref 11.7–15.4)
WBC: 11.2 10*3/uL — ABNORMAL HIGH (ref 3.4–10.8)

## 2020-10-04 NOTE — Telephone Encounter (Signed)
I spoke with the patient's daughter in law, Karen Cunningham (ok per [facility]) regarding the patient's lab results and Dr. Serita Kyle recommendations as stated below.  Per Karen Cunningham, she does feel like the patient's breathing may have been a bit worse lately but it is hard to tell.  She will have the patient take furosemide 40 mg QD x 2 days (Saturday & Sunday), then resume 20 mg once daily on Monday.   I have advised they call back if her breathing worsens or they feel she may need some additional furosemide.   The patient has not been weighing at home as she has no scale.  I have advised Karen Cunningham that daily weights is a very helpful way for [place] to determine if the patient is retaining fluid as indicated by a 3 lb weight gain overnight or 5 lbs in 1 week.  Karen Cunningham advised she will try to get the patient a scale as she feels she will be compliant with this.  Karen Cunningham would like to touch base with [place] next week and update [place] on how the patient is doing. I advised her this is fine and she may communicate by MyChart if she wishes as this may be easier than holding on the phone.  Karen Cunningham voices understanding of all of the above recommendations and is agreeable.  She will notify the patient of the above.

## 2020-10-04 NOTE — Telephone Encounter (Signed)
Yvonne Kendall, MD  10/04/2020 1:59 PM EST Back to Top     Please let Ms. Quaranta know that her kidney function and electrolytes are normal. She has a mild chronic elevation of her WBC, which is stable. Her BNP is elevated, which can be indicative of fluid retention. If her breathing is not yet back to normal, I suggest that she increase furosemide to 40 mg daily x 2 days and then return to 20 mg daily. If symptoms worsen, she should contact us.

## 2020-10-16 ENCOUNTER — Other Ambulatory Visit: Payer: Self-pay

## 2020-10-16 ENCOUNTER — Ambulatory Visit (INDEPENDENT_AMBULATORY_CARE_PROVIDER_SITE_OTHER): Payer: Medicare Other

## 2020-10-16 DIAGNOSIS — I4891 Unspecified atrial fibrillation: Secondary | ICD-10-CM | POA: Diagnosis not present

## 2020-10-16 DIAGNOSIS — Z5181 Encounter for therapeutic drug level monitoring: Secondary | ICD-10-CM | POA: Diagnosis not present

## 2020-10-16 LAB — POCT INR: INR: 3.8 — AB (ref 2.0–3.0)

## 2020-10-16 NOTE — Patient Instructions (Signed)
-   skip warfarin tonight, - have a serving of greens today - continue dosage of warfarin of 1 tablet (2 mg) every day.  - recheck INR in 4 weeks.   * Try to see if you can have your greens on a schedule

## 2020-11-01 ENCOUNTER — Ambulatory Visit: Payer: Medicare Other | Admitting: Podiatry

## 2020-11-05 ENCOUNTER — Other Ambulatory Visit: Payer: Self-pay

## 2020-11-05 ENCOUNTER — Ambulatory Visit (INDEPENDENT_AMBULATORY_CARE_PROVIDER_SITE_OTHER): Payer: Medicare Other | Admitting: Podiatry

## 2020-11-05 DIAGNOSIS — B351 Tinea unguium: Secondary | ICD-10-CM | POA: Diagnosis not present

## 2020-11-05 DIAGNOSIS — M79609 Pain in unspecified limb: Secondary | ICD-10-CM

## 2020-11-05 NOTE — Progress Notes (Signed)
   SUBJECTIVE Patient presents to office today complaining of elongated, thickened nails that cause pain while ambulating in shoes. She is unable to trim her own nails. Patient is here for further evaluation and treatment.  Past Medical History:  Diagnosis Date  . (HFpEF) heart failure with preserved ejection fraction (HCC)    a. 07/2016 Echo: EF 51%; b. 12/2016 Echo: EF 41%. Mod TR, mild PR; c. 09/2017 Echo [facility]): EF 62%, Sev BAE. Mod TR, mild PR; d. 03/2019 Echo([facility]): EF 57%, Gr3 DD. Sev dil LA, mild dil RA. Midly dil Asc Ao. Trace AI. Mild to mod TR, mild PR. RVSP .  Marland Kitchen Anemia   . Arthritis   . Carotid arterial disease (HCC)    a. 10/2013 Mod to Sev RICA dzs. Mild dzs in L bulb; b. 10/2013 CTA Carotids: LICA 50p, RICA w/o stenosis.  . CKD (chronic kidney disease), stage III (HCC)   . Coronary artery disease    a. Several neg MV between 2008 and 2010; b. 2010 s/p PCI/DES to the mLAD; c. 10/2013 MV: Fixed apical defect, likely breast attenuation, no ischemia; d. 09/2014 MV: No ischemia; e. 03/2018 MV [facility]): no ischemia.  Marland Kitchen GERD (gastroesophageal reflux disease)   . HLD (hyperlipidemia)   . Hypertension   . Hypothyroidism   . Permanent atrial fibrillation (HCC)    a. Dx ~ 2017. CHA2DS2VASc = 6-->coumadin.    OBJECTIVE General Patient is awake, alert, and oriented x 3 and in no acute distress. Derm Skin is dry and supple bilateral. Negative open lesions or macerations. Remaining integument unremarkable. Nails are tender, long, thickened and dystrophic with subungual debris, consistent with onychomycosis, 1-5 bilateral. No signs of infection noted. Vasc  DP and PT pedal pulses palpable bilaterally. Temperature gradient within normal limits.  Neuro Epicritic and protective threshold sensation grossly intact bilaterally.  Musculoskeletal Exam No symptomatic pedal deformities noted bilateral. Muscular strength within normal limits.  ASSESSMENT 1. Onychodystrophic nails 1-5 bilateral with  hyperkeratosis of nails.  2. Onychomycosis of nail due to dermatophyte bilateral 3. Pain in foot bilateral  PLAN OF CARE 1. Patient evaluated today.  2. Instructed to maintain good pedal hygiene and foot care.  3. Mechanical debridement of nails 1-5 bilaterally performed using a nail nipper. Filed with dremel without incident.  4. Return to clinic in 3 mos.   * goes by [employer].    Felecia Shelling, DPM [facility]  Dr. Felecia Shelling, DPM    [address] [place], [place] [address]                Office [phone]  Fax [phone]

## 2020-11-09 NOTE — Progress Notes (Signed)
Office Visit    Patient Name: Karen Cunningham Date of Encounter: 11/13/2020  Primary Care Provider:  Margaretann Loveless, MD Primary Cardiologist:  Yvonne Kendall, MD  Chief Complaint    Chief Complaint  Patient presents with  . Follow-up    1 month    87 year old female with history of permanent atrial fibrillation on Coumadin, hypertension, hyperlipidemia, hypothyroidism, GERD, CAD s/p stenting of the mid LAD in 2010, carotid artery disease, anemia, arthritis, stage III chronic kidney disease, and HFpEF, and who presents today for 1 mo follow-up.  Past Medical History    Past Medical History:  Diagnosis Date  . (HFpEF) heart failure with preserved ejection fraction (HCC)    a. 07/2016 Echo: EF 51%; b. 12/2016 Echo: EF 41%. Mod TR, mild PR; c. 09/2017 Echo Wel[facility] Flakes): EF 62%, Sev BAE. Mod TR, mild PR; d. 03/2019 Echo([facility]): EF 57%, Gr3 DD. Sev dil LA, mild dil RA. Midly dil Asc Ao. Trace AI. Mild to mod TR, mild PR. RVSP .  Marland Kitchen Anemia   . Arthritis   . Carotid arterial disease (HCC)    a. 10/2013 Mod to Sev RICA dzs. Mild dzs in L bulb; b. 10/2013 CTA Carotids: LICA 50p, RICA w/o stenosis.  . CKD (chronic kidney disease), stage III (HCC)   . Coronary artery disease    a. Several neg MV between 2008 and 2010; b. 2010 s/p PCI/DES to the mLAD; c. 10/2013 MV: Fixed apical defect, likely breast attenuation, no ischemia; d. 09/2014 MV: No ischemia; e. 03/2018 MV Welton Flakes): no ischemia.  Marland Kitchen GERD (gastroesophageal reflux disease)   . HLD (hyperlipidemia)   . Hypertension   . Hypothyroidism   . Permanent atrial fibrillation (HCC)    a. Dx ~ 2017. CHA2DS2VASc = 6-->coumadin.   Past Surgical History:  Procedure Laterality Date  . ABDOMINAL HYSTERECTOMY    . APPENDECTOMY    . BACK SURGERY    . CARDIAC CATHETERIZATION     x1 stent;[facility]  . CORONARY STENT PLACEMENT    . JOINT REPLACEMENT Left    hip  . JOINT REPLACEMENT Bilateral    knees  . NISSEN FUNDOPLICATION    . THORACOTOMY    . TOTAL  HIP ARTHROPLASTY Right 03/09/2018   Procedure: TOTAL HIP ARTHROPLASTY;  Surgeon: Donato Heinz, MD;  Location: [facility] ORS;  Service: Orthopedics;  Laterality: Right;    Allergies  Allergies  Allergen Reactions  . Codeine Nausea And Vomiting  . Nsaids     Dr instructed to avoid due to kidney issues   . Ibuprofen Other (See Comments)    Dr instructed to avoid due to kidney issues   . Prednisone Other (See Comments)    ELEVATED BLOOD PRESSURE.    History of Present Illness    Karen Cunningham is a 87 y.o. female with history of CAD, permanent atrial fibrillation on Coumadin, hypertension, hyperlipidemia, CKD, hypothyroidism, and lower extremity edema, and who presents for 1 mo follow-up.    She was previously followed by Dr. Welton Flakes and underwent PCI and stenting to the mid LAD in 2010 in the setting of shortness of breath.  She was diagnosed with atrial fibrillation approximately 2017 and initially offered cardioversion; however, she deferred this in favor for rate control.  She has been maintained on beta-blocker therapy and Coumadin.   She established with Dr. Okey Dupre 08/2019 in the setting of chronic fatigue and worsening exertional dyspnea.  She reported DOE in the setting of increasing sedentary lifestyle and recent  weight gain.  She reported most of her exercise classes being canceled due to COVID-19. She was seen in March 2021 and reported stable DOE with modest activity.  She felt better with addition of diltiazem.  A 14-day event monitor was performed to exclude periods of rapid ventricular response contributing to her dyspnea.  Monitor showed average heart rate 84 bpm with range 40 to 82 bpm.  Echo showed low normal LVEF.   She uses Biofreeze for neck pain.  She was last seen by her primary cardiologist, Dr. Okey Dupre, 10/03/2020 for reevaluation of elevated rates and BP.  HCTZ had been continued due to azotemia, but she continued Lasix and spironolactone for volume management. She had received  her COVID-19 booster 1 week earlier. Recently, she had contacted the office for SOB and a jittery feeling, as well as report of home elevated BP/HR. Her daughter also felt that she had some DOE. She contacted the office, and the pt's diltiazem was increased to  with report of some relief in her sx after this change. At the time of her visit, she also reported associated headache and generalized fatigue.  She felt she was retaining more fluid.  Review of home BP showed normal to mildly elevated readings with 1 low reading attributed to error. On exam, JVP mildly elevated. Reds vest 31%. BNP elevated.  It was recommended she double Lasix to 40 mg daily x2 days then drop down to  daily. She was advised to increase to lasix  as needed for increasing shortness of breath, edema, and weight gain. It was noted she was taking spironolactone 12.5 mg on Mondays and Thursdays only.    Today, 11/13/20, she returns to clinic and notes that she is overall doing well since her medication changes as above. She reports improvement in her previously reported sx as above. She denies further jittery feeling. Her breathing is improved. No further HA or fatigue. ReDS vest 31%. Wt today is down 1 lb from previous clinic wt. BP improved. Both she and her daughter feel that her volume status has improved. No CP, racing HR, palpitations, presyncope, syncope. Stable LEE. No abdominal distention or early satiety. No s/sx of bleeding. She reports medication compliance. Wt this AM on home scale 141 lbs (versus clinic 144lbs). She has a new BP cuff that is working well. She reports needing 1 extra lasix  two days in a row 1 week ago and two other times since then but otherwise using her baseline lasix. She does admit that she sometimes adds salt to food. She enjoys morning breakfast bowls, which have more sodium than other breakfast options she reports.  She enjoys potatoes and a caffeinated tea every so often.   Home  Medications    Prior to Admission medications   Medication Sig Start Date End Date Taking? Authorizing Provider  acetaminophen (TYLENOL) 500 MG tablet Take 1 tablet (500 mg total) by mouth every 6 (six) hours as needed for moderate pain (pain). 09/29/18  Yes Wieting, Richard, MD  atorvastatin (LIPITOR) 40 MG tablet Take 1 tablet (40 mg total) by mouth at bedtime. 09/20/19  Yes Creig Hines, NP  Cholecalciferol (VITAMIN D3) 1000 UNITS CAPS Take 1,000 Units by mouth daily.    Yes [provider]  diltiazem (CARDIZEM CD) 180 MG 24 hr capsule Take 1 capsule (180 mg total) by mouth daily. 08/31/19 12/19/28 Yes End, Cristal Deer, MD  Ferrous Sulfate (IRON) 325 (65 FE) MG TABS Take 325 mg by mouth daily.  Yes [provider]  furosemide (LASIX) 20 MG tablet Take 1 tablet (20 mg total) by mouth daily. Take 1 tablet (20 mg) by mouth two days a week (Monday and Thursday). 01/31/20  Yes End, Cristal Deerhristopher, MD  hydrochlorothiazide (HYDRODIURIL) 12.5 MG tablet Take 1 tablet (12.5 mg total) by mouth daily. 09/20/19  Yes Creig HinesBerge, Christopher Ronald, NP  levothyroxine (SYNTHROID, LEVOTHROID) 88 MCG tablet Take 88 mcg by mouth daily before breakfast.   Yes [provider]  losartan (COZAAR) 50 MG tablet Take 1 tablet (50 mg total) by mouth daily. 09/20/19  Yes Creig HinesBerge, Christopher Ronald, NP  Melatonin 10 MG TABS Take 10 mg by mouth at bedtime.   Yes [provider]  metoprolol tartrate (LOPRESSOR) 100 MG tablet Take 1 tablet (100 mg total) by mouth 2 (two) times daily. 09/20/19  Yes Creig HinesBerge, Christopher Ronald, NP  omeprazole (PRILOSEC) 20 MG capsule Take 20 mg by mouth daily.   Yes [provider]  spironolactone (ALDACTONE) 25 MG tablet Take 12.5 mg by mouth 2 (two) times a week. Monday and Thursday   Yes [provider]  warfarin (COUMADIN) 2 MG tablet Take 1 tablet (2 mg total) by mouth every evening. Patient taking differently: Take 2 mg by mouth every  evening. As directed 09/20/19  Yes Creig HinesBerge, Christopher Ronald, NP    Review of Systems    She denies chest pain, palpitations, dyspnea, pnd, orthopnea, n, v, dizziness, syncope, weight gain, or early satiety. LEE stable.  No further jittery feeling. Breathing improved. No HA or fatigue as reported at her previous visit. She reports improved neck pain.   All other systems reviewed and are otherwise negative except as noted above.  Physical Exam    VS:  BP 116/64   Pulse 76   Ht 5' (1.524 m)   Wt 144 lb (65.3 kg)   LMP  (LMP Unknown)   BMI 28.12 kg/m  , BMI Body mass index is 28.12 kg/m. GEN: Well nourished, well developed, in no acute distress. Joined by her daughter HEENT: normal. Neck: Supple, no JVD, carotid bruits, or masses. Cardiac: IRIR with controlled ventricular rate, 2/6 systolic murmur, murmurs, rubs, or gallops. No clubbing, cyanosis. 1+ bialteral edema.  Radials/DP/PT 2+ and equal bilaterally.  Respiratory:  Respirations regular and unlabored, clear to auscultation bilaterally. GI: Soft, nontender, nondistended, BS + x 4. MS: no deformity or atrophy. Skin: warm and dry, no rash. Neuro:  Strength and sensation are intact. Psych: Normal affect.  Accessory Clinical Findings    ECG personally reviewed by me today - atrial fibrillation with ventricular rate 76bpm, - no acute changes.  VITALS Reviewed today   Temp Readings from Last 3 Encounters:  10/07/18 97.9 F (36.6 C) (Oral)  09/29/18 98.2 F (36.8 C) (Oral)  03/11/18 98.6 F (37 C) (Oral)   BP Readings from Last 3 Encounters:  11/13/20 116/64  10/03/20 (!) 150/60  09/18/20 122/62   Pulse Readings from Last 3 Encounters:  11/13/20 76  10/03/20 89  09/18/20 80    Wt Readings from Last 3 Encounters:  11/13/20 144 lb (65.3 kg)  10/03/20 145 lb 4 oz (65.9 kg)  09/18/20 142 lb (64.4 kg)     LABS  reviewed today   Lab Results  Component Value Date   WBC 11.2 (H) 10/03/2020   HGB 12.2 10/03/2020    HCT 38.2 10/03/2020   MCV 90 10/03/2020   PLT 319 10/03/2020   Lab Results  Component Value Date  CREATININE 0.95 10/03/2020   BUN 15 10/03/2020   NA 141 10/03/2020   K 4.7 10/03/2020   CL 105 10/03/2020   CO2 22 10/03/2020   Lab Results  Component Value Date   ALT 15 02/25/2018   AST 25 02/25/2018   ALKPHOS 87 02/25/2018   BILITOT 1.5 (H) 02/25/2018   No results found for: CHOL, HDL, LDLCALC, LDLDIRECT, TRIG, CHOLHDL  No results found for: HGBA1C No results found for: TSH   Recent labs per PCP 11/24/2019   STUDIES/PROCEDURES reviewed today   Bilateral carotids 09/28/2019 Summary:  Right Carotid: Velocities in the right ICA are consistent with a 1-39%  stenosis.         Non-hemodynamically significant plaque <50% noted in the  CCA. The         ECA appears <50% stenosed.   Left Carotid: Velocities in the left ICA are consistent with a 1-39%  stenosis.        Non-hemodynamically significant plaque <50% noted in the  CCA. The        ECA appears <50% stenosed.   Vertebrals: Bilateral vertebral arteries demonstrate antegrade flow.  Subclavians: Normal flow hemodynamics were seen in bilateral subclavian        arteries.   Cardiac monitoring 12/20/2019  Patient was monitored for 14 days.  The predominant rhythm was atrial fibrillation with an average ventricular rate of 84 bpm (range 40 to 182 bpm). Atrial fibrillation burden was 100%.  There were rare PVCs.  A single 9 beat run of nonsustained ventricular tachycardia was observed with a maximal rate of 156 bpm.  There was no prolonged pause.  Patient triggered events correspond to atrial fibrillation and atrial fibrillation with rapid ventricular response, as well as isolated PVCs.   Echocardiogram 01/25/2020 1. Left ventricular ejection fraction, by estimation, is 50 to 55%. The  left ventricle has low normal function. The left ventricle has no regional  wall motion  abnormalities. Left ventricular diastolic parameters are  consistent with Grade II diastolic  dysfunction (pseudonormalization).  2. Right ventricular systolic function is normal. The right ventricular  size is normal. There is mildly elevated pulmonary artery systolic  pressure.  3. Left atrial size was severely dilated.  4. Right atrial size was severely dilated.  5. The mitral valve is degenerative. Mild mitral valve regurgitation.  6. Tricuspid valve regurgitation is mild to moderate.  7. Aortic valve Mean gradient is , peak gradient is , DVI  0.35. The aortic valve is tricuspid. Aortic valve regurgitation is not  visualized. Mild aortic valve stenosis.  8. The inferior vena cava is normal in size with <50% respiratory  variability, suggesting right atrial pressure of 8 mmHg.     Assessment & Plan   Permanent atrial fibrillation --Denies tachypalpitations.  Ventricular rate well controlled. She is feeling well with escalation of diltiazem to 240mg  (needs refill today). She is maintained on warfarin with INR today 2.8.  Continue anticoagulation as directed with current regimen of metoprolol and diltiazem.  No medication changes.  Chronic HFpEF --Volume status improved from previous reported by progress notes. Appears euvolemic and well compensated.  Weight 1 lb down from previous clinic visit. BP well controlled. She reports ongoing improvement of fatigue and exertional dyspnea with current dosing of furosemide and PRN additional lasix 20mg . She is aware to take the additional lasix with increasing SOB, edema, and wt gain. She also is aware to call the office if needing to take the extra lasix more than on  an occasional basis.  She continues on twice weekly spironolactone and current losartan. Salt and fluid recommendations reviewed as above.  Repeat BMET recommended today with further recommendations pending labs if indicated by Cr/K. We will also check a BNP today to  trend these values.   Valvular heart disease --Recent echo as above with mild aortic stenosis, mild MR, mild to moderate TR.  Continue annual echocardiogram and management of her HFpEF as above.  CAD without angina --No reported angina or symptoms concerning for anginal equivalent.  Continue current medications.  HLD --Continue current statin.   Disposition: RTC 3-4 months.    Lennon Alstrom, PA-C 11/13/2020

## 2020-11-13 ENCOUNTER — Ambulatory Visit (INDEPENDENT_AMBULATORY_CARE_PROVIDER_SITE_OTHER): Payer: Medicare Other

## 2020-11-13 ENCOUNTER — Other Ambulatory Visit: Payer: Self-pay

## 2020-11-13 ENCOUNTER — Ambulatory Visit (INDEPENDENT_AMBULATORY_CARE_PROVIDER_SITE_OTHER): Payer: Medicare Other | Admitting: Physician Assistant

## 2020-11-13 ENCOUNTER — Encounter: Payer: Self-pay | Admitting: Physician Assistant

## 2020-11-13 VITALS — BP 116/64 | HR 76 | Ht 60.0 in | Wt 144.0 lb

## 2020-11-13 DIAGNOSIS — E785 Hyperlipidemia, unspecified: Secondary | ICD-10-CM

## 2020-11-13 DIAGNOSIS — I38 Endocarditis, valve unspecified: Secondary | ICD-10-CM

## 2020-11-13 DIAGNOSIS — Z5181 Encounter for therapeutic drug level monitoring: Secondary | ICD-10-CM | POA: Diagnosis not present

## 2020-11-13 DIAGNOSIS — I4821 Permanent atrial fibrillation: Secondary | ICD-10-CM

## 2020-11-13 DIAGNOSIS — I4891 Unspecified atrial fibrillation: Secondary | ICD-10-CM | POA: Diagnosis not present

## 2020-11-13 DIAGNOSIS — I739 Peripheral vascular disease, unspecified: Secondary | ICD-10-CM

## 2020-11-13 DIAGNOSIS — I5032 Chronic diastolic (congestive) heart failure: Secondary | ICD-10-CM

## 2020-11-13 DIAGNOSIS — I2510 Atherosclerotic heart disease of native coronary artery without angina pectoris: Secondary | ICD-10-CM

## 2020-11-13 LAB — POCT INR: INR: 2.8 (ref 2.0–3.0)

## 2020-11-13 NOTE — Patient Instructions (Signed)
-   continue dosage of warfarin of 1 tablet (2 mg) every day.  - recheck INR in 5 weeks.   * Try to see if you can have your greens on a schedule

## 2020-11-13 NOTE — Patient Instructions (Signed)
Medication Instructions:   Your physician recommends that you continue on your current medications as directed. Please refer to the Current Medication list given to you today.  *If you need a refill on your cardiac medications before your next appointment, please call your pharmacy*   Lab Work:  Your physician recommends that you have lab work TODAY: BNP, Bmet  If you have labs (blood work) drawn today and your tests are completely normal, you will receive your results only by: Marland Kitchen MyChart Message (if you have MyChart) OR . A paper copy in the mail If you have any lab test that is abnormal or we need to change your treatment, we will call you to review the results.   Testing/Procedures: None ordered   Follow-Up: At [facility], you and your health needs are our priority.  As part of our continuing mission to provide you with exceptional heart care, we have created designated Provider Care Teams.  These Care Teams include your primary Cardiologist (physician) and Advanced Practice Providers (APPs -  Physician Assistants and Nurse Practitioners) who all work together to provide you with the care you need, when you need it.  We recommend signing up for the patient portal called "MyChart".  Sign up information is provided on this After Visit Summary.  MyChart is used to connect with patients for Virtual Visits (Telemedicine).  Patients are able to view lab/test results, encounter notes, upcoming appointments, etc.  Non-urgent messages can be sent to your provider as well.   To learn more about what you can do with MyChart, go to [email or web address].    Your next appointment:   3 - 4 month(s)  The format for your next appointment:   In Person  Provider:   Yvonne Kendall, MD

## 2020-11-14 LAB — BASIC METABOLIC PANEL
BUN/Creatinine Ratio: 20 (ref 12–28)
BUN: 22 mg/dL (ref 8–27)
CO2: 20 mmol/L (ref 20–29)
Calcium: 9.7 mg/dL (ref 8.7–10.3)
Chloride: 107 mmol/L — ABNORMAL HIGH (ref 96–106)
Creatinine, Ser: 1.1 mg/dL — ABNORMAL HIGH (ref 0.57–1.00)
GFR calc Af Amer: 52 mL/min/{1.73_m2} — ABNORMAL LOW (ref >59)
GFR calc non Af Amer: 45 mL/min/{1.73_m2} — ABNORMAL LOW (ref >59)
Glucose: 113 mg/dL — ABNORMAL HIGH (ref 65–99)
Potassium: 5.3 mmol/L — ABNORMAL HIGH (ref 3.5–5.2)
Sodium: 143 mmol/L (ref 134–144)

## 2020-11-14 LAB — BRAIN NATRIURETIC PEPTIDE: BNP: 465.5 pg/mL — ABNORMAL HIGH (ref 0.0–100.0)

## 2020-11-15 ENCOUNTER — Telehealth: Payer: Self-pay | Admitting: *Deleted

## 2020-11-15 DIAGNOSIS — E875 Hyperkalemia: Secondary | ICD-10-CM

## 2020-11-15 NOTE — Telephone Encounter (Signed)
-----   Message from Lennon Alstrom, PA-C sent at 11/15/2020  4:45 PM EST ----- Labs show  --BNP (of fluid lab value) is slightly improved from that of previous number.  As suspected, her volume status is improving, which is good news. --Glucose is mildly elevated, which is not concerning, given these are nonfasting labs. --Renal function slightly elevated from previous labs. --Potassium elevated at 5.3.  This could be pseudohyperkalemia and due to lab error.  Recommend repeat BMET at her earliest convenience.  Please have her hold spironolactone entirely until these repeat labs.

## 2020-11-15 NOTE — Telephone Encounter (Signed)
Spoke to pt, notified of lab results and provider's recc.  Pt verbalized understanding. She will (per JV result note)   1) Repeat BMET to confirm potassium value 2) Hold spironolactone 3) Cut / decrease losartan dose in half to losartan 25mg  daily for now.  Monitor BP closely with changes.  Further recommendations after we have a repeat BMET to confirm that her labs collected are accurate and not due to lab error /pseudohyperkalemia.  Also spoke to pt's daughter in law, Lima Chillemi per pt's request and left message on vm to notify of the above recc.  Asked her to call our office back with any questions.

## 2020-11-18 NOTE — Telephone Encounter (Signed)
Daughter-in-law has some questions regarding follow up labwork and doseage on Losartan. Please call to discuss.

## 2020-11-18 NOTE — Telephone Encounter (Signed)
Scheduling verbally stated Karen Cunningham was calling back this afternoon, but I was getting on another call.  Just now attempted to call Karen Cunningham back. No answer- I left a message to call back.

## 2020-11-18 NOTE — Telephone Encounter (Signed)
Attempted to call Pam. No answer- I left a message to please call back.

## 2020-11-19 NOTE — Telephone Encounter (Signed)
Spoke to [employer] (daughterinlaw) ok per DPR. She verbalized understanding of the med changes. Patient decreased losartan on 2/26 and last dose of spironolactone was 2/24. She will take patient to [facility] for lab work this afternoon or tomorrow.  We then had an extensive conversation about patient's diet.  Since COVID she's been eating Vilma Meckel Breakfast bowls every morning. She then adds salt and more cheese to them. She refuses to eat anything but white bread or [employer] Hawaiian rolls. Pam is trying to help patient understanding that her sodium intake is too much and that she needs to make better nutritional choices. We talked about this for a long time. She's going to help patient when they go shopping.

## 2020-11-20 ENCOUNTER — Other Ambulatory Visit
Admission: RE | Admit: 2020-11-20 | Discharge: 2020-11-20 | Disposition: A | Discharge status: Home / Self Care | Payer: Medicare Other | Source: Ambulatory Visit | Attending: Physician Assistant | Admitting: Physician Assistant

## 2020-11-20 DIAGNOSIS — E875 Hyperkalemia: Secondary | ICD-10-CM | POA: Insufficient documentation

## 2020-11-20 LAB — BASIC METABOLIC PANEL
Anion gap: 9 (ref 5–15)
BUN: 28 mg/dL — ABNORMAL HIGH (ref 8–23)
CO2: 25 mmol/L (ref 22–32)
Calcium: 9.1 mg/dL (ref 8.9–10.3)
Chloride: 104 mmol/L (ref 98–111)
Creatinine, Ser: 1.08 mg/dL — ABNORMAL HIGH (ref 0.44–1.00)
GFR, Estimated: 50 mL/min — ABNORMAL LOW (ref >60)
Glucose, Bld: 111 mg/dL — ABNORMAL HIGH (ref 70–99)
Potassium: 4 mmol/L (ref 3.5–5.1)
Sodium: 138 mmol/L (ref 135–145)

## 2020-11-21 ENCOUNTER — Telehealth: Payer: Self-pay | Admitting: Physician Assistant

## 2020-11-21 DIAGNOSIS — I10 Essential (primary) hypertension: Secondary | ICD-10-CM

## 2020-11-21 MED ORDER — DILTIAZEM HCL ER COATED BEADS 360 MG PO CP24: 360.0000 mg | ORAL_CAPSULE | Freq: Every day | ORAL | 3 refills | Status: DC

## 2020-11-21 MED ORDER — LOSARTAN POTASSIUM 50 MG PO TABS: 25.0000 mg | ORAL_TABLET | Freq: Every day | ORAL | 6 refills | Status: DC

## 2020-11-21 NOTE — Telephone Encounter (Signed)
Spoke with pt and with daughter.  Pt reports improvement in her breathing is ongoing.  She does note bendopnea and DOE. We reviewed her most recent weights and blood pressure.    Since decreasing to losartan 25 mg and holding spironolactone (2/2 hyperkalemia), her labs show improved renal function and potassium on repeat BMET.  Since this change, however, heart rate has been between 80 to 90 bpm.  SBP 132- 165 and DBP 62 106.  Weight 141 to 143 pounds.  We reviewed vitals from the last couple of days.  3/1: 7:30 AM, 142.4 pounds, 125/85, 94 bpm, Lasix 20 mg 3/2: 7:10 AM, 143.2 pounds, 109/64, 83 bpm, Lasix 20 mg 3/3, 7:30 AM, 143.4 pounds, 112/66, 77 bpm  Recommendation was to continue to hold spironolactone, as we will likely discontinue this medication.  We will keep her on the reduced losartan 25 mg daily.  Given her elevated heart rate, we increased to diltiazem 360 mg to see how well she tolerates this increase.  She is agreeable to sending heart rate/blood pressure/weights again within the next week and after this medication change. She should continue her lasix as directed.  Updated her medication charts to reflect this change.  Her daughter did mention the pharmacy stuck the instructions for spironolactone on her most recent refill for Lasix.  We will call the pharmacy to correct this on her Lasix.  Aundra Millet, are you able to reach out to the pharmacy?

## 2020-11-22 ENCOUNTER — Telehealth: Payer: Self-pay | Admitting: *Deleted

## 2020-11-22 NOTE — Telephone Encounter (Signed)
Patient returning call re results   Please call.

## 2020-11-22 NOTE — Telephone Encounter (Signed)
Left voicemail message to call back for review of results.  

## 2020-11-22 NOTE — Telephone Encounter (Signed)
Spoke to pharmacist and updated Rx to Lasix 20mg  daily. They did have on file that it was twice weekly.  Rx now updated.

## 2020-11-22 NOTE — Telephone Encounter (Signed)
-----   Message from Lennon Alstrom, PA-C sent at 11/21/2020  6:06 PM EST ----- Do you mind calling her pharmacy and making sure they know that her lasix is daily and not only taken twice per week? Her daughter said she thinks the pharmacy confused her lasix prescription with her previous spironolactone instructions.

## 2020-11-22 NOTE — Telephone Encounter (Signed)
-----   Message from Lennon Alstrom, PA-C sent at 11/21/2020  4:38 PM EST ----- Labs show improved renal function and potassium since holding spironolactone and cutting losartan.  Please refer to my telephone note for further information.

## 2020-11-22 NOTE — Telephone Encounter (Signed)
Reviewed results and recommendations with daughter in law per release form and she verbalized understanding with no further questions at this time.

## 2020-11-24 ENCOUNTER — Telehealth: Payer: Self-pay | Admitting: Cardiology

## 2020-11-24 NOTE — Telephone Encounter (Signed)
    Received outpatient page from Ms. Levey's daughter in law stating that she was feeling fine earlier today with a BP at 123/59 and a heart rate at 77 bpm.  Unfortunately, she began to feel poorly and recheck her blood pressure that was noted to be 91/49 with a heart rate at 48 bpm.  On recheck 10 minutes later BP improved to 109/58 with a heart rate at 50 bpm.  She had recently been seen in the outpatient setting within increase of her long-acting diltiazem from 240 mg to 360 mg daily.  Unfortunately she is also on losartan 25 mg p.o. daily.  Plan per daughter and patient was to monitor her BP and heart rate closely.  We will have her reduce her dose of back to 240 mg p.o. daily however if her heart rate is 60 bpm or less tomorrow morning or her BP is 100 or less she is to hold both of these medications.  Regardless, she is to call the office to move her upcoming appointment closer.  ED precautions reviewed with daughter-in-law which she understands and agrees.  Hopefully she can be managed without ED assistance however will need washout time.   Georgie Chard NP-C [facility] Pager: [phone]

## 2020-11-26 ENCOUNTER — Telehealth: Payer: Self-pay | Admitting: Internal Medicine

## 2020-11-26 NOTE — Telephone Encounter (Signed)
I agree with plans to decrease diltiazem back to 240 mg daily and to increase furosemide to 40 mg daily for the next 2 to 3 days.  Follow-up on 11/29/2020 is appropriate unless symptoms worsen in the meantime.  Yvonne Kendall, MD [facility]

## 2020-11-26 NOTE — Telephone Encounter (Signed)
Patient daughter in law is returning your call.

## 2020-11-26 NOTE — Telephone Encounter (Signed)
Pt c/o medication issue:  1. Name of Medication: Cardizem  2. How are you currently taking this medication (dosage and times per day)? 240 mg daily. Recent increase and then over the weekend patient and daughter spoke w. After hours provider who recommended to decrease from 360 mg)  3. Are you having a reaction (difficulty breathing--STAT)?   4. What is your medication issue?  BP and HR has been low 3/6 123/59, 77; 3/6 91/49, 48 at 1 pm; 146/64,78 7 pm Weight is up more than normal. 3/6-141, 3/7-144, 3/8-146  Patient does not feel like she is holding fluid or swelling

## 2020-11-26 NOTE — Telephone Encounter (Signed)
Patient's daughter-in-law verbalized understanding of the plan of care and upcoming appointment date and time.

## 2020-11-26 NOTE — Telephone Encounter (Signed)
No answer.  Left detailed message on Pam's, daughter-in-law, with Dr Serita Kyle recommendations.  I went ahead and scheduled patient for 11/29/20. Asked her to call us back to let us know she got the message.

## 2020-11-26 NOTE — Telephone Encounter (Signed)
Spoke to patient's daughter-in-law, ok per DPR. There is a telephone note from NP over the weekend concerning BP and HR. NP Advised patient to decrease her diltiazem back to 240 mg daily instead of 360 mg daily. Patient has only taken the new increased dose on Friday and Saturday.  Since decreasing back to 240 mg daily, patient reports she feels more like herself again. Denies SOB, dizziness, headache or weakness.  Today's BP/HR without meds was 138/87, HR 72. However, patient's weight has increased as follows: 3/6-141 lb 3/7-144 lb  3/8-146 lb She does not feel like she's holding fluid.  From office visit in January with Dr End: If you have increased Shortness of breath, you may increase furosemide to 40 mg by mouth daily for 1-2 days. Advised daughter-in-law to do the same for the weight increase today and tomorrow as well.  Lastly, oncall provider had advised to move patient's appointment up from 12/06/20.  Only opening is Friday, 3/11 with Brion Aliment, NP. Advised I will route to Dr End to review the plan of care for further furosemide for the weight gain and if appointment still needs to be sooner.

## 2020-11-29 ENCOUNTER — Other Ambulatory Visit: Payer: Self-pay

## 2020-11-29 ENCOUNTER — Other Ambulatory Visit
Admission: RE | Admit: 2020-11-29 | Discharge: 2020-11-29 | Disposition: A | Discharge status: Home / Self Care | Payer: Medicare Other | Source: Ambulatory Visit | Attending: Medical | Admitting: Medical

## 2020-11-29 ENCOUNTER — Ambulatory Visit (INDEPENDENT_AMBULATORY_CARE_PROVIDER_SITE_OTHER): Payer: Medicare Other | Admitting: Medical

## 2020-11-29 ENCOUNTER — Encounter: Payer: Self-pay | Admitting: Medical

## 2020-11-29 VITALS — BP 124/56 | HR 87 | Ht 60.0 in | Wt 143.0 lb

## 2020-11-29 DIAGNOSIS — E785 Hyperlipidemia, unspecified: Secondary | ICD-10-CM

## 2020-11-29 DIAGNOSIS — I5032 Chronic diastolic (congestive) heart failure: Secondary | ICD-10-CM

## 2020-11-29 DIAGNOSIS — I10 Essential (primary) hypertension: Secondary | ICD-10-CM | POA: Diagnosis not present

## 2020-11-29 DIAGNOSIS — I38 Endocarditis, valve unspecified: Secondary | ICD-10-CM

## 2020-11-29 DIAGNOSIS — I2510 Atherosclerotic heart disease of native coronary artery without angina pectoris: Secondary | ICD-10-CM

## 2020-11-29 LAB — BASIC METABOLIC PANEL
Anion gap: 11 (ref 5–15)
BUN: 23 mg/dL (ref 8–23)
CO2: 26 mmol/L (ref 22–32)
Calcium: 9.5 mg/dL (ref 8.9–10.3)
Chloride: 102 mmol/L (ref 98–111)
Creatinine, Ser: 1.17 mg/dL — ABNORMAL HIGH (ref 0.44–1.00)
GFR, Estimated: 45 mL/min — ABNORMAL LOW (ref >60)
Glucose, Bld: 107 mg/dL — ABNORMAL HIGH (ref 70–99)
Potassium: 3.9 mmol/L (ref 3.5–5.1)
Sodium: 139 mmol/L (ref 135–145)

## 2020-11-29 NOTE — Progress Notes (Signed)
Cardiology Office Note:    Date:  11/29/2020   ID:  Karen Cunningham, DOB May 22, 1931, MRN [id number]  PCP:  Margaretann Loveless, MD  [facility] Cardiologist:  Yvonne Kendall, MD  [facility] Electrophysiologist:  None   Referring MD: Margaretann Loveless, MD   Chief Complaint: 1 month follow-up  History of Present Illness:    Karen Cunningham is a 87 y.o. female with a hx of permanent Afib on Coumadin, HTN, HLD, hypothyroidism, GERD, CAD s/p stenting of the midLAD in 2010, carotid artery disease, anemia, arthritis, CKD stage 3, HFpEF who presents for 1 month follow-up.   Patient was previously followed by Dr. Lennette Bihari and underwent PCI and stenting to the mid LAD in 2010 in the setting of shortness of breath.  She was diagnosed with atrial fibrillation in 2017 initially after cardioversion, however, she deferred this in favor rate control.  She has been maintained on beta-blocker therapy and Coumadin.  She was established with Dr. Okey Dupre 09/10/2019 in the setting of chronic fatigue and worsening exertional dyspnea.  She was seen in March 2021 and reported stable DOE with modest activity.  She felt better with addition of diltiazem. 14-day monitor was performed to exclude periods of rapid ventricular response contributing to her dyspnea.  Monitor showed average heart rate of 84.  Echo showed low normal LVEF.   The patient was seen 10/03/2020 and reported increased volume status.  RedsVest 31%. Lasix was doubled for 2 days then drop back down to 20 mg daily. HCTZ previously discontinued due to azotemia, but she continued Lasix and spironolactone.  Patient reported palpitations and diltiazem was increased to 240 mg with relief.  Seen in follow-up 11/13/2020 for volume status.  Reds vest 31% weight was down 1 pound from previous visit.  She has labs and potassium and sodium were high so spiro was held. Losartan was decreased to 25mg  daily. Follow-up labs showed normal potassium, sodium, creatinine was better.    Called in to report BP were elevated and diltiazem was increased to 360mg  daily, however BP got low 91/49, and diltiazem was decreased back down to 240mg .  Today, the patient is accompanied by her daughter in law. Monday 3/7 weight went up 144lbs and on 3/8 weight was 146lbs She took extra lasix and was was scheudled to be seen in the office. She has had lasix 40 mg for the last 3 days. Today she is 143lbs and had 20mg  lasix today. She did experience mild DOE and mild pedal edema that has improved with increased dose lasix 40 the last few days. Bps were labile after decreasing losartan and increasing diltiazem, however diltiazem caused hypotension (as above) and this was decreased. Recent Bps at home were reviewed and they are running 110-130s/80s. HR 80s. Most of the pressures are before medications. No chest pain, orthopnea, fever, chills. Denies recurrent palpitations. ReDSvest 33%.    Past Medical History:  Diagnosis Date  . (HFpEF) heart failure with preserved ejection fraction (HCC)    a. 07/2016 Echo: EF 51%; b. 12/2016 Echo: EF 41%. Mod TR, mild PR; c. 09/2017 Echo ): EF 62%, Sev BAE. Mod TR, mild PR; d. 03/2019 Echo([facility]): EF 57%, Gr3 DD. Sev dil LA, mild dil RA. Midly dil Asc Ao. Trace AI. Mild to mod TR, mild PR. RVSP 01/2017.  10/2017 Anemia   . Arthritis   . Carotid arterial disease (HCC)    a. 10/2013 Mod to Sev RICA dzs. Mild dzs in L bulb; b. 10/2013  CTA Carotids: LICA 50p, RICA w/o stenosis.  . CKD (chronic kidney disease), stage III (HCC)   . Coronary artery disease    a. Several neg MV between 2008 and 2010; b. 2010 s/p PCI/DES to the mLAD; c. 10/2013 MV: Fixed apical defect, likely breast attenuation, no ischemia; d. 09/2014 MV: No ischemia; e. 03/2018 MV [facility] [facility]): no ischemia.  Marland Kitchen GERD (gastroesophageal reflux disease)   . HLD (hyperlipidemia)   . Hypertension   . Hypothyroidism   . Permanent atrial fibrillation (HCC)    a. Dx ~ 2017. CHA2DS2VASc = 6-->coumadin.    Past Surgical  History:  Procedure Laterality Date  . ABDOMINAL HYSTERECTOMY    . APPENDECTOMY    . BACK SURGERY    . CARDIAC CATHETERIZATION     x1 stent;[facility]  . CORONARY STENT PLACEMENT    . JOINT REPLACEMENT Left    hip  . JOINT REPLACEMENT Bilateral    knees  . NISSEN FUNDOPLICATION    . THORACOTOMY    . TOTAL HIP ARTHROPLASTY Right 03/09/2018   Procedure: TOTAL HIP ARTHROPLASTY;  Surgeon: Donato Heinz, MD;  Location: [facility] ORS;  Service: Orthopedics;  Laterality: Right;    Current Medications: Current Meds  Medication Sig  . acetaminophen (TYLENOL) 500 MG tablet Take 1 tablet (500 mg total) by mouth every 6 (six) hours as needed for moderate pain (pain).  Marland Kitchen atorvastatin (LIPITOR) 40 MG tablet Take 1 tablet (40 mg total) by mouth at bedtime.  . Cholecalciferol (VITAMIN D3) 1000 UNITS CAPS Take 1,000 Units by mouth daily.   Marland Kitchen diltiazem (DILACOR XR) 240 MG 24 hr capsule Take 240 mg by mouth daily.  . Ferrous Sulfate (IRON) 325 (65 FE) MG TABS Take 325 mg by mouth daily.   . furosemide (LASIX) 20 MG tablet Take 20 mg by mouth daily.  Marland Kitchen levothyroxine (SYNTHROID, LEVOTHROID) 88 MCG tablet Take 88 mcg by mouth daily before breakfast.  . losartan (COZAAR) 50 MG tablet Take 0.5 tablets (25 mg total) by mouth daily.  . Melatonin 10 MG TABS Take 10 mg by mouth at bedtime.  . metoprolol tartrate (LOPRESSOR) 100 MG tablet Take 1 tablet (100 mg total) by mouth 2 (two) times daily.  Marland Kitchen omeprazole (PRILOSEC) 20 MG capsule Take 20 mg by mouth daily.  Marland Kitchen warfarin (COUMADIN) 2 MG tablet Take 1 tablet (2 mg total) by mouth every evening.     Allergies:   Codeine, Nsaids, Ibuprofen, and Prednisone   Social History   Socioeconomic History  . Marital status: Widowed    Spouse name: Not on file  . Number of children: Not on file  . Years of education: Not on file  . Highest education level: Not on file  Occupational History  . Not on file  Tobacco Use  . Smoking status: Never Smoker  . Smokeless  tobacco: Never Used  Vaping Use  . Vaping Use: Never used  Substance and Sexual Activity  . Alcohol use: Never  . Drug use: Never  . Sexual activity: Not on file  Other Topics Concern  . Not on file  Social History Narrative  . Not on file   Social Determinants of Health   Financial Resource Strain: Not on file  Food Insecurity: Not on file  Transportation Needs: Not on file  Physical Activity: Not on file  Stress: Not on file  Social Connections: Not on file     Family History: The patient's family history includes AAA (abdominal aortic aneurysm) in her mother;  Heart Problems in her father and mother; Heart attack in her mother; Hypertension in her mother.  ROS:   Please see the history of present illness.     All other systems reviewed and are negative.  EKGs/Labs/Other Studies Reviewed:    The following studies were reviewed today:  Echo 01/2020 1. Left ventricular ejection fraction, by estimation, is 50 to 55%. The  left ventricle has low normal function. The left ventricle has no regional  wall motion abnormalities. Left ventricular diastolic parameters are  consistent with Grade II diastolic  dysfunction (pseudonormalization).  2. Right ventricular systolic function is normal. The right ventricular  size is normal. There is mildly elevated pulmonary artery systolic  pressure.  3. Left atrial size was severely dilated.  4. Right atrial size was severely dilated.  5. The mitral valve is degenerative. Mild mitral valve regurgitation.  6. Tricuspid valve regurgitation is mild to moderate.  7. Aortic valve Mean gradient is , peak gradient is , DVI  0.35. The aortic valve is tricuspid. Aortic valve regurgitation is not  visualized. Mild aortic valve stenosis.  8. The inferior vena cava is normal in size with <50% respiratory  variability, suggesting right atrial pressure of 8 mmHg.   Holter monitor 11/2019   Patient was monitored for 14  days.  The predominant rhythm was atrial fibrillation with an average ventricular rate of 84 bpm (range 40 to 182 bpm). Atrial fibrillation burden was 100%.  There were rare PVCs.  A single 9 beat run of nonsustained ventricular tachycardia was observed with a maximal rate of 156 bpm.  There was no prolonged pause.  Patient triggered events correspond to atrial fibrillation and atrial fibrillation with rapid ventricular response, as well as isolated PVCs.   Persistent atrial fibrillation with average ventricular rate of 84 bpm.  Rare PVCs and single 9 beat run of NSVT noted.   EKG:  EKG is not ordered today.   Recent Labs: 10/03/2020: Hemoglobin 12.2; Platelets 319 11/13/2020: BNP 465.5 11/20/2020: BUN 28; Creatinine, Ser 1.08; Potassium 4.0; Sodium 138  Recent Lipid Panel No results found for: CHOL, TRIG, HDL, CHOLHDL, VLDL, LDLCALC, LDLDIRECT   Physical Exam:    VS:  BP (!) 124/56 (BP Location: Left Arm, Patient Position: Sitting, Cuff Size: Normal)   Pulse 87   Ht 5' (1.524 m)   Wt 143 lb (64.9 kg)   LMP  (LMP Unknown)   SpO2 97%   BMI 27.93 kg/m     Wt Readings from Last 3 Encounters:  11/29/20 143 lb (64.9 kg)  11/13/20 144 lb (65.3 kg)  10/03/20 145 lb 4 oz (65.9 kg)     GEN:  Well nourished, well developed in no acute distress HEENT: Normal NECK: No JVD; No carotid bruits LYMPHATICS: No lymphadenopathy CARDIAC: Irreg Irreg, + murmur, rubs, gallops RESPIRATORY:  Clear to auscultation without rales, wheezing or rhonchi  ABDOMEN: Soft, non-tender, non-distended MUSCULOSKELETAL:  No edema; No deformity  SKIN: Warm and dry NEUROLOGIC:  Alert and oriented x 3 PSYCHIATRIC:  Normal affect   ASSESSMENT:    1. Chronic heart failure with preserved ejection fraction (HFpEF) (HCC)   2. Essential hypertension   3. Valvular heart disease   4. Coronary artery disease involving native coronary artery of native heart without angina pectoris   5. Hyperlipidemia LDL goal  <70    PLAN:    In order of problems listed above:  Permanent atrial fibrillation Rate controlled on Metoprolol and diltiazem. Diltiazem 360mg  caused hypotension so  this was decreased back down to 240mg . Rate today in the 80s. She denies frequent palpitations. Continue warfarin.   Chronic HFpEF Patient with recent some medication changes. Cleda DaubSpiro was held for hyperkalemia and Losartan was decreased to 25mg  daily. More recently weight went up to 146lbs and she took lasix 40mg  x 3 days. Baseline weight is 141-143lbs. She took lasix 20 mg daily today. RedsVest 33%. SOB and pedal edema resolved. Euvolemic on exam. Continue current lasix dose with an extra 20mg  PRN. BMET today. Patient is eating low salt diet. We will see her back in 1 month to assess volume status.  HTN Losartan decreased for hyperkalemia as above. Increased diltiazem cause hypotension as above. Bps at home before medications 110-130s. Continue current dose of medications. They will call if it starts going up, can go back up on Losartan if needed. Continue metoprolol.   Valvular heart disease Echo 01/2020 showed mild MT, mild to mod TR, mild AS. Continue annual echo  CAD without angina Patient denies chest pain. Continue statin and BB.   HLD Continue statin  Disposition: Follow up in 1 month(s) with APP/MD    Signed, Titianna Loomis David StallH Raquel Sayres, PA-C  11/29/2020 4:27 PM    [facility]

## 2020-11-29 NOTE — Patient Instructions (Addendum)
Medication Instructions:  No changes at this time.  *If you need a refill on your cardiac medications before your next appointment, please call your pharmacy*   Lab Work: BMET today at [facility]   If you have labs (blood work) drawn today and your tests are completely normal, you will receive your results only by: Marland Kitchen MyChart Message (if you have MyChart) OR . A paper copy in the mail If you have any lab test that is abnormal or we need to change your treatment, we will call you to review the results.   Testing/Procedures: None   Follow-Up: At [facility], you and your health needs are our priority.  As part of our continuing mission to provide you with exceptional heart care, we have created designated Provider Care Teams.  These Care Teams include your primary Cardiologist (physician) and Advanced Practice Providers (APPs -  Physician Assistants and Nurse Practitioners) who all work together to provide you with the care you need, when you need it.   Your next appointment:   1 month(s)  The format for your next appointment:   In Person  Provider:   Yvonne Kendall, MD or Cadence Fransico [place], [place]

## 2020-12-06 ENCOUNTER — Ambulatory Visit: Payer: Medicare Other | Admitting: Internal Medicine

## 2020-12-18 ENCOUNTER — Other Ambulatory Visit: Payer: Self-pay

## 2020-12-18 ENCOUNTER — Ambulatory Visit (INDEPENDENT_AMBULATORY_CARE_PROVIDER_SITE_OTHER): Payer: Medicare Other

## 2020-12-18 DIAGNOSIS — Z5181 Encounter for therapeutic drug level monitoring: Secondary | ICD-10-CM

## 2020-12-18 DIAGNOSIS — I4891 Unspecified atrial fibrillation: Secondary | ICD-10-CM

## 2020-12-18 LAB — POCT INR: INR: 2.8 (ref 2.0–3.0)

## 2020-12-18 NOTE — Patient Instructions (Signed)
-   continue dosage of warfarin of 1 tablet (2 mg) every day.  - recheck INR in 6 weeks  * Try to see if you can have your greens on a schedule 

## 2020-12-30 ENCOUNTER — Telehealth: Payer: Self-pay | Admitting: Internal Medicine

## 2020-12-30 MED ORDER — DILTIAZEM HCL ER 240 MG PO CP24: 240.0000 mg | ORAL_CAPSULE | Freq: Every day | ORAL | 1 refills | Status: DC

## 2020-12-30 NOTE — Telephone Encounter (Signed)
diltiazem (DILACOR XR) 240 MG 24 hr capsule [[id number]]   Order Details Dose: 240 mg Route: Oral Frequency: Daily  Dispense Quantity: 90 capsule Refills: 1       Sig: Take 1 capsule (240 mg total) by mouth daily.      Start Date: 12/30/20 End Date: --  Written Date: 12/30/20 Expiration Date: 12/30/21  Original Order:  diltiazem (DILACOR XR) 240 MG 24 hr capsule [[id number]]   Providers  Ordering and Authorizing Provider:   Yvonne Kendall, MD  [address], [place] [address]  Phone:  [phone]  Fax:  [phone]  DEA #:  [id number]  NPI:  [id number]     Ordering User:  Jeanene Erb       Pharmacy  [facility] [address] [place] - [address]  [address] [place] [address]  Phone:  [phone] Fax:  [phone]  DEA #:  --  DAW Reason: --

## 2021-01-08 ENCOUNTER — Ambulatory Visit: Payer: Medicare Other | Admitting: Internal Medicine

## 2021-01-17 ENCOUNTER — Encounter: Payer: Self-pay | Admitting: Internal Medicine

## 2021-01-17 ENCOUNTER — Other Ambulatory Visit: Payer: Self-pay

## 2021-01-17 ENCOUNTER — Ambulatory Visit (INDEPENDENT_AMBULATORY_CARE_PROVIDER_SITE_OTHER): Payer: Medicare Other | Admitting: Internal Medicine

## 2021-01-17 VITALS — BP 140/60 | HR 80 | Ht 60.0 in | Wt 145.0 lb

## 2021-01-17 DIAGNOSIS — I10 Essential (primary) hypertension: Secondary | ICD-10-CM

## 2021-01-17 DIAGNOSIS — I38 Endocarditis, valve unspecified: Secondary | ICD-10-CM

## 2021-01-17 DIAGNOSIS — I4821 Permanent atrial fibrillation: Secondary | ICD-10-CM | POA: Diagnosis not present

## 2021-01-17 NOTE — Patient Instructions (Signed)
Medication Instructions:  Your physician recommends that you continue on your current medications as directed. Please refer to the Current Medication list given to you today.  *If you need a refill on your cardiac medications before your next appointment, please call your pharmacy*   Lab Work: None ordered If you have labs (blood work) drawn today and your tests are completely normal, you will receive your results only by: Marland Kitchen MyChart Message (if you have MyChart) OR . A paper copy in the mail If you have any lab test that is abnormal or we need to change your treatment, we will call you to review the results.   Testing/Procedures: None ordered   Follow-Up: At [facility], you and your health needs are our priority.  As part of our continuing mission to provide you with exceptional heart care, we have created designated Provider Care Teams.  These Care Teams include your primary Cardiologist (physician) and Advanced Practice Providers (APPs -  Physician Assistants and Nurse Practitioners) who all work together to provide you with the care you need, when you need it.  We recommend signing up for the patient portal called "MyChart".  Sign up information is provided on this After Visit Summary.  MyChart is used to connect with patients for Virtual Visits (Telemedicine).  Patients are able to view lab/test results, encounter notes, upcoming appointments, etc.  Non-urgent messages can be sent to your provider as well.   To learn more about what you can do with MyChart, go to [email or web address].    Your next appointment:   4 month(s)  The format for your next appointment:   In Person  Provider:   You may see Yvonne Kendall, MD or one of the following Advanced Practice Providers on your designated Care Team:    Nicolasa Ducking, NP  Eula Listen, PA-C  Marisue Ivan, PA-C  Cadence [place], [place]  Gillian Shields, NP    Other Instructions N/A

## 2021-01-17 NOTE — Progress Notes (Signed)
Follow-up Outpatient Visit Date: 01/17/2021  Primary Care Provider: Margaretann Loveless, MD [address] [place] [address]  Chief Complaint: Follow-up heart failure and atrial fibrillation  HPI:  Karen Cunningham is a 87 y.o. female with history of coronary artery disease,HFpEF, permanentatrial fibrillation, hypertension, hyperlipidemia, chronic kidney disease, hypothyroidism, and chronic lower extremity edema, who presents for follow-up of atrial fibrillation and coronary artery disease.  She was last seen in our office in mid March by [place], [place].  At that time, she reported weight gain and exertional dyspnea a few days earlier that improved with escalation of furosemide.  RedsVest was 33%.  No medication changes or additional testing were pursued.  Today, Karen Cunningham reports that she has been feeling well.  She is monitoring her weight closely and has taken 30 mg of furosemide (instead of her usual 20 mg) daily on a few occasions due to weight gain and mild edema.  This helps keep her weight stable. She denies shortness of breath, palpitations, lightheadedness, and chest pain.  She fell in late March after making a misstep while getting out of bed.  She did not suffer any significant injuries, though she was unable to get off the ground for several hours until she finally contacted her family to help her.  --------------------------------------------------------------------------------------------------  Past Medical History:  Diagnosis Date  . (HFpEF) heart failure with preserved ejection fraction (HCC)    a. 07/2016 Echo: EF 51%; b. 12/2016 Echo: EF 41%. Mod TR, mild PR; c. 09/2017 Echo [facility]): EF 62%, Sev BAE. Mod TR, mild PR; d. 03/2019 Echo([facility]): EF 57%, Gr3 DD. Sev dil LA, mild dil RA. Midly dil Asc Ao. Trace AI. Mild to mod TR, mild PR. RVSP .  Marland Kitchen Anemia   . Arthritis   . Carotid arterial disease (HCC)    a. 10/2013 Mod to Sev RICA dzs. Mild dzs in L bulb; b. 10/2013 CTA  Carotids: LICA 50p, RICA w/o stenosis.  . CKD (chronic kidney disease), stage III (HCC)   . Coronary artery disease    a. Several neg MV between 2008 and 2010; b. 2010 s/p PCI/DES to the mLAD; c. 10/2013 MV: Fixed apical defect, likely breast attenuation, no ischemia; d. 09/2014 MV: No ischemia; e. 03/2018 MV [facility] [facility]): no ischemia.  Marland Kitchen GERD (gastroesophageal reflux disease)   . HLD (hyperlipidemia)   . Hypertension   . Hypothyroidism   . Permanent atrial fibrillation (HCC)    a. Dx ~ 2017. CHA2DS2VASc = 6-->coumadin.   Past Surgical History:  Procedure Laterality Date  . ABDOMINAL HYSTERECTOMY    . APPENDECTOMY    . BACK SURGERY    . CARDIAC CATHETERIZATION     x1 stent;[facility]  . CORONARY STENT PLACEMENT    . JOINT REPLACEMENT Left    hip  . JOINT REPLACEMENT Bilateral    knees  . NISSEN FUNDOPLICATION    . THORACOTOMY    . TOTAL HIP ARTHROPLASTY Right 03/09/2018   Procedure: TOTAL HIP ARTHROPLASTY;  Surgeon: Donato Heinz, MD;  Location: [facility] ORS;  Service: Orthopedics;  Laterality: Right;    Current Meds  Medication Sig  . acetaminophen (TYLENOL) 500 MG tablet Take 1 tablet (500 mg total) by mouth every 6 (six) hours as needed for moderate pain (pain).  Marland Kitchen atorvastatin (LIPITOR) 40 MG tablet Take 1 tablet (40 mg total) by mouth at bedtime.  . Cholecalciferol (VITAMIN D3) 1000 UNITS CAPS Take 1,000 Units by mouth daily.   Marland Kitchen diltiazem (DILACOR XR) 240 MG  24 hr capsule Take 1 capsule (240 mg total) by mouth daily.  . Ferrous Sulfate (IRON) 325 (65 FE) MG TABS Take 325 mg by mouth daily.   . furosemide (LASIX) 20 MG tablet Take 20 mg by mouth daily.  Marland Kitchen levothyroxine (SYNTHROID, LEVOTHROID) 88 MCG tablet Take 88 mcg by mouth daily before breakfast.  . losartan (COZAAR) 50 MG tablet Take 0.5 tablets (25 mg total) by mouth daily.  . Melatonin 10 MG TABS Take 10 mg by mouth at bedtime.  . metoprolol tartrate (LOPRESSOR) 100 MG tablet Take 1 tablet (100 mg total) by mouth 2 (two) times  daily.  Marland Kitchen omeprazole (PRILOSEC) 20 MG capsule Take 20 mg by mouth daily.  Marland Kitchen warfarin (COUMADIN) 2 MG tablet Take 1 tablet (2 mg total) by mouth every evening.    Allergies: Codeine, Nsaids, Ibuprofen, and Prednisone  Social History   Tobacco Use  . Smoking status: Never Smoker  . Smokeless tobacco: Never Used  Vaping Use  . Vaping Use: Never used  Substance Use Topics  . Alcohol use: Never  . Drug use: Never    Family History  Problem Relation Age of Onset  . Heart Problems Mother   . Heart attack Mother   . AAA (abdominal aortic aneurysm) Mother   . Hypertension Mother   . Heart Problems Father     Review of Systems: A 12-system review of systems was performed and was negative except as noted in the HPI.  --------------------------------------------------------------------------------------------------  Physical Exam: BP 140/60 (BP Location: Right Arm, Patient Position: Sitting, Cuff Size: Normal)   Pulse 80   Ht 5' (1.524 m)   Wt 145 lb (65.8 kg)   LMP  (LMP Unknown)   SpO2 95%   BMI 28.32 kg/m   General:  NAD. Neck: No JVD or HJR. Lungs: Clear to auscultation bilaterally without wheezes or crackles. Heart: Irregularly irregular rhythm with 2/6 systolic murmur. Abdomen: Soft, nontender, nondistended. Extremities: No lower extremity edema.  Lab Results  Component Value Date   WBC 11.2 (H) 10/03/2020   HGB 12.2 10/03/2020   HCT 38.2 10/03/2020   MCV 90 10/03/2020   PLT 319 10/03/2020    Lab Results  Component Value Date   NA 139 11/29/2020   K 3.9 11/29/2020   CL 102 11/29/2020   CO2 26 11/29/2020   BUN 23 11/29/2020   CREATININE 1.17 (H) 11/29/2020   GLUCOSE 107 (H) 11/29/2020   ALT 15 02/25/2018    No results found for: CHOL, HDL, LDLCALC, LDLDIRECT, TRIG, CHOLHDL  --------------------------------------------------------------------------------------------------  ASSESSMENT AND PLAN: Chronic HFpEF: Karen Cunningham appears euvolemic on exam  today with NYHA class II-III HF.  We will continue her current medications, including furosemide 20 mg daily (she can take 30-40 mg if needed for weight gain).  Permanent atrial fibrillation: EKG not done today but HR on exam is reasonable.  We will continue current regimen of diltaizem 240 mg daily and metoprolol tartrate 100 mg BID.  Ongoing anticoagulation with warfarin per our [facility] clinic.  Hypertension: Blood pressure borderline elevated today.  However, in an effort to minimize the risks of symptomatic hypotension, we will defer medication changes today.  Follow-up: Return to clinic in 4 months.  Yvonne Kendall, MD 01/17/2021 11:52 AM

## 2021-01-18 ENCOUNTER — Encounter: Payer: Self-pay | Admitting: Internal Medicine

## 2021-01-29 ENCOUNTER — Ambulatory Visit (INDEPENDENT_AMBULATORY_CARE_PROVIDER_SITE_OTHER): Payer: Medicare Other

## 2021-01-29 ENCOUNTER — Other Ambulatory Visit: Payer: Self-pay

## 2021-01-29 DIAGNOSIS — I4891 Unspecified atrial fibrillation: Secondary | ICD-10-CM | POA: Diagnosis not present

## 2021-01-29 DIAGNOSIS — Z5181 Encounter for therapeutic drug level monitoring: Secondary | ICD-10-CM

## 2021-01-29 LAB — POCT INR: INR: 2.5 (ref 2.0–3.0)

## 2021-01-29 NOTE — Patient Instructions (Signed)
-   continue dosage of warfarin of 1 tablet (2 mg) every day.  - recheck INR in 6 weeks.

## 2021-02-04 ENCOUNTER — Ambulatory Visit: Payer: Medicare Other | Admitting: Podiatry

## 2021-03-11 ENCOUNTER — Encounter: Payer: Self-pay | Admitting: Podiatry

## 2021-03-11 ENCOUNTER — Other Ambulatory Visit: Payer: Self-pay

## 2021-03-11 ENCOUNTER — Ambulatory Visit (INDEPENDENT_AMBULATORY_CARE_PROVIDER_SITE_OTHER): Payer: Medicare Other | Admitting: Podiatry

## 2021-03-11 DIAGNOSIS — B351 Tinea unguium: Secondary | ICD-10-CM

## 2021-03-11 DIAGNOSIS — M79609 Pain in unspecified limb: Secondary | ICD-10-CM

## 2021-03-11 NOTE — Progress Notes (Signed)
   SUBJECTIVE Patient presents to office today complaining of elongated, thickened nails that cause pain while ambulating in shoes. She is unable to trim her own nails. Patient is here for further evaluation and treatment.  Past Medical History:  Diagnosis Date   (HFpEF) heart failure with preserved ejection fraction (HCC)    a. 07/2016 Echo: EF 51%; b. 12/2016 Echo: EF 41%. Mod TR, mild PR; c. 09/2017 Echo [facility]): EF 62%, Sev BAE. Mod TR, mild PR; d. 03/2019 Echo([facility]): EF 57%, Gr3 DD. Sev dil LA, mild dil RA. Midly dil Asc Ao. Trace AI. Mild to mod TR, mild PR. RVSP .   Anemia    Arthritis    Carotid arterial disease (HCC)    a. 10/2013 Mod to Sev RICA dzs. Mild dzs in L bulb; b. 10/2013 CTA Carotids: LICA 50p, RICA w/o stenosis.   CKD (chronic kidney disease), stage III (HCC)    Coronary artery disease    a. Several neg MV between 2008 and 2010; b. 2010 s/p PCI/DES to the mLAD; c. 10/2013 MV: Fixed apical defect, likely breast attenuation, no ischemia; d. 09/2014 MV: No ischemia; e. 03/2018 MV [facility]): no ischemia.   GERD (gastroesophageal reflux disease)    HLD (hyperlipidemia)    Hypertension    Hypothyroidism    Permanent atrial fibrillation (HCC)    a. Dx ~ 2017. CHA2DS2VASc = 6-->coumadin.    OBJECTIVE General Patient is awake, alert, and oriented x 3 and in no acute distress. Derm Skin is dry and supple bilateral. Negative open lesions or macerations. Remaining integument unremarkable. Nails are tender, long, thickened and dystrophic with subungual debris, consistent with onychomycosis, 1-5 bilateral. No signs of infection noted. Vasc  DP and PT pedal pulses palpable bilaterally. Temperature gradient within normal limits.  Neuro Epicritic and protective threshold sensation grossly intact bilaterally.  Musculoskeletal Exam No symptomatic pedal deformities noted bilateral. Muscular strength within normal limits.  ASSESSMENT 1. Onychodystrophic nails 1-5 bilateral with  hyperkeratosis of nails.  2. Onychomycosis of nail due to dermatophyte bilateral 3. Pain in foot bilateral  PLAN OF CARE 1. Patient evaluated today.  2. Instructed to maintain good pedal hygiene and foot care.  3. Mechanical debridement of nails 1-5 bilaterally performed using a nail nipper. Filed with dremel without incident.  4. Return to clinic in 3 mos.   * goes by [employer].    Felecia Shelling, DPM [facility]  Dr. Felecia Shelling, DPM    [address] [place], [place] [address]                Office [phone]  Fax [phone]

## 2021-03-12 ENCOUNTER — Ambulatory Visit (INDEPENDENT_AMBULATORY_CARE_PROVIDER_SITE_OTHER): Payer: Medicare Other

## 2021-03-12 ENCOUNTER — Telehealth: Payer: Self-pay | Admitting: Internal Medicine

## 2021-03-12 DIAGNOSIS — I4891 Unspecified atrial fibrillation: Secondary | ICD-10-CM | POA: Diagnosis not present

## 2021-03-12 DIAGNOSIS — Z5181 Encounter for therapeutic drug level monitoring: Secondary | ICD-10-CM | POA: Diagnosis not present

## 2021-03-12 LAB — POCT INR: INR: 2.2 (ref 2.0–3.0)

## 2021-03-12 NOTE — Patient Instructions (Signed)
-   continue dosage of warfarin of 1 tablet (2 mg) every day.  - recheck INR in 6 weeks  * Try to see if you can have your greens on a schedule 

## 2021-03-12 NOTE — Telephone Encounter (Signed)
Patient dropped off lab result, put in box

## 2021-03-13 NOTE — Telephone Encounter (Signed)
Labs reviewed and placed to be scanned in chart.

## 2021-03-19 ENCOUNTER — Ambulatory Visit: Payer: Medicare Other | Admitting: Physician Assistant

## 2021-03-27 ENCOUNTER — Other Ambulatory Visit: Payer: Self-pay | Admitting: *Deleted

## 2021-03-27 MED ORDER — DILTIAZEM HCL ER 240 MG PO CP24: 240.0000 mg | ORAL_CAPSULE | Freq: Every day | ORAL | 3 refills | Status: DC

## 2021-04-07 ENCOUNTER — Encounter: Payer: Self-pay | Admitting: Internal Medicine

## 2021-04-23 ENCOUNTER — Other Ambulatory Visit: Payer: Self-pay

## 2021-04-23 ENCOUNTER — Ambulatory Visit (INDEPENDENT_AMBULATORY_CARE_PROVIDER_SITE_OTHER): Payer: Medicare Other

## 2021-04-23 DIAGNOSIS — Z5181 Encounter for therapeutic drug level monitoring: Secondary | ICD-10-CM

## 2021-04-23 DIAGNOSIS — I4891 Unspecified atrial fibrillation: Secondary | ICD-10-CM | POA: Diagnosis not present

## 2021-04-23 LAB — POCT INR: INR: 2.4 (ref 2.0–3.0)

## 2021-04-23 NOTE — Patient Instructions (Signed)
-   continue dosage of warfarin of 1 tablet (2 mg) every day.  - recheck INR in 6 weeks  * Try to see if you can have your greens on a schedule 

## 2021-05-29 ENCOUNTER — Other Ambulatory Visit: Payer: Self-pay

## 2021-05-29 MED ORDER — FUROSEMIDE 20 MG PO TABS: 20.0000 mg | ORAL_TABLET | Freq: Every day | ORAL | 0 refills | Status: DC

## 2021-05-30 ENCOUNTER — Other Ambulatory Visit: Payer: Self-pay

## 2021-05-30 MED ORDER — ATORVASTATIN CALCIUM 40 MG PO TABS: 40.0000 mg | ORAL_TABLET | Freq: Every day | ORAL | 0 refills | Status: DC

## 2021-06-04 ENCOUNTER — Ambulatory Visit (INDEPENDENT_AMBULATORY_CARE_PROVIDER_SITE_OTHER): Payer: Medicare Other

## 2021-06-04 ENCOUNTER — Other Ambulatory Visit: Payer: Self-pay

## 2021-06-04 ENCOUNTER — Ambulatory Visit (INDEPENDENT_AMBULATORY_CARE_PROVIDER_SITE_OTHER): Payer: Medicare Other | Admitting: Internal Medicine

## 2021-06-04 ENCOUNTER — Encounter: Payer: Self-pay | Admitting: Internal Medicine

## 2021-06-04 VITALS — BP 140/62 | HR 76 | Ht 60.0 in | Wt 142.0 lb

## 2021-06-04 DIAGNOSIS — I4891 Unspecified atrial fibrillation: Secondary | ICD-10-CM | POA: Diagnosis not present

## 2021-06-04 DIAGNOSIS — R519 Headache, unspecified: Secondary | ICD-10-CM

## 2021-06-04 DIAGNOSIS — Z5181 Encounter for therapeutic drug level monitoring: Secondary | ICD-10-CM | POA: Diagnosis not present

## 2021-06-04 DIAGNOSIS — I4821 Permanent atrial fibrillation: Secondary | ICD-10-CM

## 2021-06-04 DIAGNOSIS — I2510 Atherosclerotic heart disease of native coronary artery without angina pectoris: Secondary | ICD-10-CM

## 2021-06-04 DIAGNOSIS — I10 Essential (primary) hypertension: Secondary | ICD-10-CM | POA: Diagnosis not present

## 2021-06-04 DIAGNOSIS — I5032 Chronic diastolic (congestive) heart failure: Secondary | ICD-10-CM | POA: Diagnosis not present

## 2021-06-04 LAB — POCT INR: INR: 2 (ref 2.0–3.0)

## 2021-06-04 NOTE — Patient Instructions (Signed)
-   don't have any greens for the next day or 2 - continue dosage of warfarin of 1 tablet (2 mg) every day.  - recheck INR in 6 weeks.   * Try to see if you can have your greens on a schedule

## 2021-06-04 NOTE — Patient Instructions (Signed)

## 2021-06-04 NOTE — Progress Notes (Signed)
Follow-up Outpatient Visit Date: 06/04/2021  Primary Care Provider: Margaretann Loveless, MD [address] [place] [address]  Chief Complaint: Follow-up CAD, HFpEF, and a-fib  HPI:  Karen Cunningham is a 87 y.o. female with history of coronary artery disease, HFpEF, permanent atrial fibrillation, hypertension, hyperlipidemia, chronic kidney disease, hypothyroidism, and chronic lower extremity edema, who presents for follow-up of coronary artery disease and atrial fibrillation.  I last saw her in April, at which time his pain was feeling well.  We did not make any medication changes or pursue additional testing.  Today, Karen Cunningham is most concerned about recent headaches.  She reports similar headaches for years, which come on randomly and last for about a week before disappearing for months to years.  She is using acetaminophen with reasonable pain control.  She otherwise feels well with stable exertional dyspnea.  She denies chest pain, palpitations, and lightheadedness.  She reports minimal leg edema and has not needed to use any prn furosemide.  --------------------------------------------------------------------------------------------------  Past Medical History:  Diagnosis Date   (HFpEF) heart failure with preserved ejection fraction (HCC)    a. 07/2016 Echo: EF 51%; b. 12/2016 Echo: EF 41%. Mod TR, mild PR; c. 09/2017 Echo [facility]): EF 62%, Sev BAE. Mod TR, mild PR; d. 03/2019 Echo([facility]): EF 57%, Gr3 DD. Sev dil LA, mild dil RA. Midly dil Asc Ao. Trace AI. Mild to mod TR, mild PR. RVSP .   Anemia    Arthritis    Carotid arterial disease (HCC)    a. 10/2013 Mod to Sev RICA dzs. Mild dzs in L bulb; b. 10/2013 CTA Carotids: LICA 50p, RICA w/o stenosis.   CKD (chronic kidney disease), stage III (HCC)    Coronary artery disease    a. Several neg MV between 2008 and 2010; b. 2010 s/p PCI/DES to the mLAD; c. 10/2013 MV: Fixed apical defect, likely breast attenuation, no ischemia; d. 09/2014 MV: No  ischemia; e. 03/2018 MV [facility] [facility]): no ischemia.   GERD (gastroesophageal reflux disease)    HLD (hyperlipidemia)    Hypertension    Hypothyroidism    Permanent atrial fibrillation (HCC)    a. Dx ~ 2017. CHA2DS2VASc = 6-->coumadin.   Past Surgical History:  Procedure Laterality Date   ABDOMINAL HYSTERECTOMY     APPENDECTOMY     BACK SURGERY     CARDIAC CATHETERIZATION     x1 stent;[facility]   CORONARY STENT PLACEMENT     JOINT REPLACEMENT Left    hip   JOINT REPLACEMENT Bilateral    knees   NISSEN FUNDOPLICATION     THORACOTOMY     TOTAL HIP ARTHROPLASTY Right 03/09/2018   Procedure: TOTAL HIP ARTHROPLASTY;  Surgeon: Donato Heinz, MD;  Location: [facility] ORS;  Service: Orthopedics;  Laterality: Right;    Current Meds  Medication Sig   acetaminophen (TYLENOL) 500 MG tablet Take 1 tablet (500 mg total) by mouth every 6 (six) hours as needed for moderate pain (pain).   atorvastatin (LIPITOR) 40 MG tablet Take 1 tablet (40 mg total) by mouth at bedtime.   Cholecalciferol (VITAMIN D3) 1000 UNITS CAPS Take 1,000 Units by mouth daily.    diltiazem (DILACOR XR) 240 MG 24 hr capsule Take 1 capsule (240 mg total) by mouth daily.   Ferrous Sulfate (IRON) 325 (65 FE) MG TABS Take 325 mg by mouth daily.    furosemide (LASIX) 20 MG tablet Take 1 tablet (20 mg total) by mouth daily.   levothyroxine (SYNTHROID, LEVOTHROID) 88  MCG tablet Take 88 mcg by mouth daily before breakfast.   losartan (COZAAR) 50 MG tablet Take 0.5 tablets (25 mg total) by mouth daily.   Melatonin 10 MG TABS Take 10 mg by mouth at bedtime.   metoprolol tartrate (LOPRESSOR) 100 MG tablet Take 1 tablet (100 mg total) by mouth 2 (two) times daily.   omeprazole (PRILOSEC) 20 MG capsule Take 20 mg by mouth daily.   warfarin (COUMADIN) 2 MG tablet Take 1 tablet (2 mg total) by mouth every evening.    Allergies: Codeine, Nsaids, Ibuprofen, and Prednisone  Social History   Tobacco Use   Smoking status: Never   Smokeless tobacco:  Never  Vaping Use   Vaping Use: Never used  Substance Use Topics   Alcohol use: Never   Drug use: Never    Family History  Problem Relation Age of Onset   Heart Problems Mother    Heart attack Mother    AAA (abdominal aortic aneurysm) Mother    Hypertension Mother    Heart Problems Father     Review of Systems: A 12-system review of systems was performed and was negative except as noted in the HPI.  --------------------------------------------------------------------------------------------------  Physical Exam: BP 140/62 (BP Location: Left Arm, Patient Position: Sitting, Cuff Size: Normal)   Pulse 76   Ht 5' (1.524 m)   Wt 142 lb (64.4 kg)   LMP  (LMP Unknown)   SpO2 96%   BMI 27.73 kg/m   General:  NAD. Neck: No JVD or HJR. Lungs: Clear to auscultation bilaterally without wheezes or crackles. Heart: Irregularly irregular with 2/6 systolic murmur. Abdomen: Soft, nontender, nondistended. Extremities: Trace pretibial edema.  EKG:  Atrial fibrillation.  Otherwise, no significant abnormality.  No change from prior tracing on 11/13/2020.  Lab Results  Component Value Date   WBC 11.2 (H) 10/03/2020   HGB 12.2 10/03/2020   HCT 38.2 10/03/2020   MCV 90 10/03/2020   PLT 319 10/03/2020    Lab Results  Component Value Date   NA 139 11/29/2020   K 3.9 11/29/2020   CL 102 11/29/2020   CO2 26 11/29/2020   BUN 23 11/29/2020   CREATININE 1.17 (H) 11/29/2020   GLUCOSE 107 (H) 11/29/2020   ALT 15 02/25/2018    No results found for: CHOL, HDL, LDLCALC, LDLDIRECT, TRIG, CHOLHDL  --------------------------------------------------------------------------------------------------  ASSESSMENT AND PLAN: Permanent atrial fibrillation: Ventricular rates remain well-controlled.  Continue current regimen of metoprolol, diltiazem, and warfarin.  Chronic HFpEF: Karen Cunningham has trace pedal edema with stable NYHA class II symptoms continue current medications, including prn  furosemide.  Hypertension: Blood pressure borderline elevated today, potentially exacerbated by headaches.  I encouraged Karen Cunningham to monitor her BP at home.  We will defer medication changes today.  Headaches: Longstanding intermittent headaches reported by Karen Cunningham.  It is okay to use prn acetaminophen as long as she does not exceed dosing recommendations on packaging.  If headaches persist, she should reach out to her PCP for further evaluation.  Follow-up: Return to clinic in 6 months.  Yvonne Kendall, MD 06/04/2021 1:44 PM

## 2021-06-05 ENCOUNTER — Encounter: Payer: Self-pay | Admitting: Internal Medicine

## 2021-06-05 DIAGNOSIS — R519 Headache, unspecified: Secondary | ICD-10-CM | POA: Insufficient documentation

## 2021-06-24 ENCOUNTER — Other Ambulatory Visit: Payer: Self-pay

## 2021-06-24 ENCOUNTER — Ambulatory Visit (INDEPENDENT_AMBULATORY_CARE_PROVIDER_SITE_OTHER): Payer: Medicare Other | Admitting: Podiatry

## 2021-06-24 DIAGNOSIS — B351 Tinea unguium: Secondary | ICD-10-CM

## 2021-06-24 DIAGNOSIS — M79609 Pain in unspecified limb: Secondary | ICD-10-CM

## 2021-06-24 NOTE — Progress Notes (Signed)
   SUBJECTIVE Patient presents to office today complaining of elongated, thickened nails that cause pain while ambulating in shoes. She is unable to trim her own nails. Patient is here for further evaluation and treatment.  Past Medical History:  Diagnosis Date   (HFpEF) heart failure with preserved ejection fraction (HCC)    a. 07/2016 Echo: EF 51%; b. 12/2016 Echo: EF 41%. Mod TR, mild PR; c. 09/2017 Echo ([facility]): EF 62%, Sev BAE. Mod TR, mild PR; d. 03/2019 Echo([facility]): EF 57%, Gr3 DD. Sev dil LA, mild dil RA. Midly dil Asc Ao. Trace AI. Mild to mod TR, mild PR. RVSP 39mmHg.   Anemia    Arthritis    Carotid arterial disease (HCC)    a. 10/2013 Mod to Sev RICA dzs. Mild dzs in L bulb; b. 10/2013 CTA Carotids: LICA 50p, RICA w/o stenosis.   CKD (chronic kidney disease), stage III (HCC)    Coronary artery disease    a. Several neg MV between 2008 and 2010; b. 2010 s/p PCI/DES to the mLAD; c. 10/2013 MV: Fixed apical defect, likely breast attenuation, no ischemia; d. 09/2014 MV: No ischemia; e. 03/2018 MV ([facility]): no ischemia.   GERD (gastroesophageal reflux disease)    HLD (hyperlipidemia)    Hypertension    Hypothyroidism    Permanent atrial fibrillation (HCC)    a. Dx ~ 2017. CHA2DS2VASc = 6-->coumadin.    OBJECTIVE General Patient is awake, alert, and oriented x 3 and in no acute distress. Derm Skin is dry and supple bilateral. Negative open lesions or macerations. Remaining integument unremarkable. Nails are tender, long, thickened and dystrophic with subungual debris, consistent with onychomycosis, 1-5 bilateral. No signs of infection noted. Vasc  DP and PT pedal pulses palpable bilaterally. Temperature gradient within normal limits.  Neuro Epicritic and protective threshold sensation grossly intact bilaterally.  Musculoskeletal Exam No symptomatic pedal deformities noted bilateral. Muscular strength within normal limits.  ASSESSMENT 1. Onychodystrophic nails 1-5 bilateral with  hyperkeratosis of nails.  2. Onychomycosis of nail due to dermatophyte bilateral 3. Pain in foot bilateral  PLAN OF CARE 1. Patient evaluated today.  2. Instructed to maintain good pedal hygiene and foot care.  3. Mechanical debridement of nails 1-5 bilaterally performed using a nail nipper. Filed with dremel without incident.  4. Return to clinic in 3 mos.   * goes by [employer].    Joplin Canty M. Emilianna Barlowe, DPM [facility]  Dr. Deacon Gadbois M. Rayssa Atha, DPM    [address]                                      [place], [place] [address]                Office [phone]  Fax [phone]     

## 2021-07-10 ENCOUNTER — Telehealth: Payer: Self-pay | Admitting: Internal Medicine

## 2021-07-10 NOTE — Telephone Encounter (Signed)
Patient daughter calling  Exposed to COVID Friday Monday pt noticed headache - head congestion - bad cough  Tested positive for COVID  PCP did not give a clear answer if she should take Paxlovid or not Would like to know what Dr End suggests  Please call to discuss

## 2021-07-10 NOTE — Telephone Encounter (Addendum)
Spoke with the patients daughter. Pt daughter sts that the patient tested positive for COVID on Tues 07/08/21. She is on day 3 from her onset of symptoms. Patients pcp has prescribed Paxlovid, Karen Cunningham has not given it to the patient because she is concerned about the interaction with the patients cardiac medications mainly wafarin.  Karen Cunningham sts that the patients symptoms are mild. Headache, cough, and some mild congestion. Karen Cunningham would like Dr. Serita Kyle recommendation regarding Paxlovid and the patients card meds and dx. Karen Cunningham sts that the pt trust Dr. Okey Dupre and would like his input.  Karen Cunningham that I would fwd the message to Dr. Okey Dupre as requested.

## 2021-07-10 NOTE — Telephone Encounter (Signed)
I am not well-versed in potential interactions associated with Paxlovid.  I will forward to our pharmacy team for their thoughts and recommendations.  Yvonne Kendall, MD [facility]

## 2021-07-11 NOTE — Telephone Encounter (Signed)
Called the patients daughter Elita Quick. Hermelinda Medicus.

## 2021-07-11 NOTE — Telephone Encounter (Signed)
Spoke with the patients daughter Karen Cunningham made aware of Dr. Serita Kyle and Efraim Kaufmann, PharmDs response and recommendation.  Karen Cunningham sts that the patients symptoms are stable and she doesn't think the patient is going to start Paxlovid. They are monitoring the patients symptoms closely and will f/u with the patients pcp if symptoms worsen.  Karen Cunningham voiced appreciation for the assistance.

## 2021-07-11 NOTE — Telephone Encounter (Signed)
Paxlovid can affect INR. This can be somewhat difficult to manage since we typically avoid brining patients in the office for 10 days after a positive test. Patient is on day 4 of onset. Medication needs to be started within 5 days. If patient chooses to take Paxlovid, she will need too keep her 10/26 appointment

## 2021-07-11 NOTE — Telephone Encounter (Signed)
Patient daughter returning call  °

## 2021-07-16 ENCOUNTER — Other Ambulatory Visit: Payer: Self-pay

## 2021-07-16 ENCOUNTER — Ambulatory Visit (INDEPENDENT_AMBULATORY_CARE_PROVIDER_SITE_OTHER): Payer: Medicare Other

## 2021-07-16 DIAGNOSIS — I4891 Unspecified atrial fibrillation: Secondary | ICD-10-CM | POA: Diagnosis not present

## 2021-07-16 DIAGNOSIS — Z5181 Encounter for therapeutic drug level monitoring: Secondary | ICD-10-CM | POA: Diagnosis not present

## 2021-07-16 LAB — POCT INR: INR: 2.3 (ref 2.0–3.0)

## 2021-07-16 NOTE — Patient Instructions (Signed)
-   continue dosage of warfarin of 1 tablet (2 mg) every day.  - recheck INR in 6 weeks  * Try to see if you can have your greens on a schedule 

## 2021-07-28 ENCOUNTER — Other Ambulatory Visit: Payer: Self-pay

## 2021-07-28 ENCOUNTER — Other Ambulatory Visit: Payer: Self-pay | Admitting: Internal Medicine

## 2021-07-29 MED ORDER — FUROSEMIDE 20 MG PO TABS: 20.0000 mg | ORAL_TABLET | Freq: Every day | ORAL | 0 refills | Status: DC | PRN

## 2021-08-27 ENCOUNTER — Ambulatory Visit (INDEPENDENT_AMBULATORY_CARE_PROVIDER_SITE_OTHER): Payer: Medicare Other

## 2021-08-27 ENCOUNTER — Other Ambulatory Visit: Payer: Self-pay

## 2021-08-27 DIAGNOSIS — I4891 Unspecified atrial fibrillation: Secondary | ICD-10-CM | POA: Diagnosis not present

## 2021-08-27 DIAGNOSIS — Z5181 Encounter for therapeutic drug level monitoring: Secondary | ICD-10-CM | POA: Diagnosis not present

## 2021-08-27 LAB — POCT INR: INR: 2.1 (ref 2.0–3.0)

## 2021-08-27 NOTE — Patient Instructions (Signed)
-   continue dosage of warfarin of 1 tablet (2 mg) every day.  - recheck INR in 6 weeks  * Try to see if you can have your greens on a schedule 

## 2021-09-09 ENCOUNTER — Other Ambulatory Visit: Payer: Self-pay | Admitting: Internal Medicine

## 2021-09-11 ENCOUNTER — Other Ambulatory Visit: Payer: Self-pay

## 2021-09-11 MED ORDER — METOPROLOL TARTRATE 100 MG PO TABS: 100.0000 mg | ORAL_TABLET | Freq: Two times a day (BID) | ORAL | 0 refills | Status: DC

## 2021-10-08 ENCOUNTER — Other Ambulatory Visit: Payer: Self-pay

## 2021-10-08 ENCOUNTER — Ambulatory Visit (INDEPENDENT_AMBULATORY_CARE_PROVIDER_SITE_OTHER): Payer: Medicare Other

## 2021-10-08 DIAGNOSIS — I4891 Unspecified atrial fibrillation: Secondary | ICD-10-CM | POA: Diagnosis not present

## 2021-10-08 DIAGNOSIS — Z5181 Encounter for therapeutic drug level monitoring: Secondary | ICD-10-CM

## 2021-10-08 LAB — POCT INR: INR: 1.7 — AB (ref 2.0–3.0)

## 2021-10-08 NOTE — Patient Instructions (Signed)
-   take extra 1/2 tablet warfarin tonight, then - continue dosage of warfarin of 1 tablet (2 mg) every day.  - recheck INR in 3 weeks

## 2021-10-29 ENCOUNTER — Ambulatory Visit (INDEPENDENT_AMBULATORY_CARE_PROVIDER_SITE_OTHER): Payer: Medicare Other

## 2021-10-29 ENCOUNTER — Other Ambulatory Visit: Payer: Self-pay

## 2021-10-29 DIAGNOSIS — I4891 Unspecified atrial fibrillation: Secondary | ICD-10-CM

## 2021-10-29 DIAGNOSIS — Z5181 Encounter for therapeutic drug level monitoring: Secondary | ICD-10-CM | POA: Diagnosis not present

## 2021-10-29 LAB — POCT INR: INR: 2.5 (ref 2.0–3.0)

## 2021-10-29 NOTE — Patient Instructions (Signed)
-   continue dosage of warfarin of 1 tablet (2 mg) every day.  - recheck INR in 4 weeks  * Try to see if you can have your greens on a schedule

## 2021-10-31 ENCOUNTER — Other Ambulatory Visit: Payer: Self-pay

## 2021-10-31 ENCOUNTER — Encounter: Payer: Self-pay | Admitting: Podiatry

## 2021-10-31 ENCOUNTER — Ambulatory Visit (INDEPENDENT_AMBULATORY_CARE_PROVIDER_SITE_OTHER): Payer: Medicare Other | Admitting: Podiatry

## 2021-10-31 DIAGNOSIS — B351 Tinea unguium: Secondary | ICD-10-CM

## 2021-10-31 DIAGNOSIS — M79609 Pain in unspecified limb: Secondary | ICD-10-CM

## 2021-11-11 NOTE — Progress Notes (Signed)
° °  SUBJECTIVE Patient presents to office today complaining of elongated, thickened nails that cause pain while ambulating in shoes. She is unable to trim her own nails. Patient is here for further evaluation and treatment.  Past Medical History:  Diagnosis Date   (HFpEF) heart failure with preserved ejection fraction ([place])    a. 07/2016 Echo: EF 51%; b. 12/2016 Echo: EF 41%. Mod TR, mild PR; c. 09/2017 Echo [facility]): EF 62%, Sev BAE. Mod TR, mild PR; d. 03/2019 Echo([facility]): EF 57%, Gr3 DD. Sev dil LA, mild dil RA. Midly dil Asc Ao. Trace AI. Mild to mod TR, mild PR. RVSP 64mmHg.   Anemia    Arthritis    Carotid arterial disease ([place])    a. 10/2013 Mod to Sev RICA dzs. Mild dzs in L bulb; b. 123XX123 CTA Carotids: LICA [id number], RICA w/o stenosis.   CKD (chronic kidney disease), stage III (HCC)    Coronary artery disease    a. Several neg MV between 2008 and 2010; b. 2010 s/p PCI/DES to the mLAD; c. 10/2013 MV: Fixed apical defect, likely breast attenuation, no ischemia; d. 09/2014 MV: No ischemia; e. 03/2018 MV [facility]): no ischemia.   GERD (gastroesophageal reflux disease)    HLD (hyperlipidemia)    Hypertension    Hypothyroidism    Permanent atrial fibrillation ([place])    a. Dx ~ 2017. CHA2DS2VASc = 6-->coumadin.    OBJECTIVE General Patient is awake, alert, and oriented x 3 and in no acute distress. Derm Skin is dry and supple bilateral. Negative open lesions or macerations. Remaining integument unremarkable. Nails are tender, long, thickened and dystrophic with subungual debris, consistent with onychomycosis, 1-5 bilateral. No signs of infection noted. Vasc  DP and PT pedal pulses palpable bilaterally. Temperature gradient within normal limits.  Neuro Epicritic and protective threshold sensation grossly intact bilaterally.  Musculoskeletal Exam No symptomatic pedal deformities noted bilateral. Muscular strength within normal limits.  ASSESSMENT 1. Onychodystrophic nails 1-5 bilateral with  hyperkeratosis of nails.  2. Onychomycosis of nail due to dermatophyte bilateral 3. Pain in foot bilateral  PLAN OF CARE 1. Patient evaluated today.  2. Instructed to maintain good pedal hygiene and foot care.  3. Mechanical debridement of nails 1-5 bilaterally performed using a nail nipper. Filed with dremel without incident.  4. Return to clinic in 3 mos.   * goes by [employer].    Edrick Kins, DPM [facility]  Dr. Edrick Kins, DPM    [address], [place] [address]                Office [phone]  Fax [phone]

## 2021-11-25 ENCOUNTER — Other Ambulatory Visit: Payer: Self-pay | Admitting: Internal Medicine

## 2021-11-26 ENCOUNTER — Other Ambulatory Visit: Payer: Self-pay

## 2021-11-26 ENCOUNTER — Ambulatory Visit (INDEPENDENT_AMBULATORY_CARE_PROVIDER_SITE_OTHER): Payer: Medicare Other

## 2021-11-26 DIAGNOSIS — I4891 Unspecified atrial fibrillation: Secondary | ICD-10-CM | POA: Diagnosis not present

## 2021-11-26 DIAGNOSIS — Z5181 Encounter for therapeutic drug level monitoring: Secondary | ICD-10-CM | POA: Diagnosis not present

## 2021-11-26 LAB — POCT INR: INR: 2.7 (ref 2.0–3.0)

## 2021-11-26 NOTE — Patient Instructions (Signed)
-   continue dosage of warfarin of 1 tablet (2 mg) every day.  - recheck INR in 6 weeks  * Try to see if you can have your greens on a schedule 

## 2021-12-03 ENCOUNTER — Other Ambulatory Visit: Payer: Self-pay

## 2021-12-03 DIAGNOSIS — I10 Essential (primary) hypertension: Secondary | ICD-10-CM

## 2021-12-03 MED ORDER — LOSARTAN POTASSIUM 50 MG PO TABS: 25.0000 mg | ORAL_TABLET | Freq: Every day | ORAL | 3 refills | Status: DC

## 2021-12-04 ENCOUNTER — Ambulatory Visit
Admission: RE | Admit: 2021-12-04 | Discharge: 2021-12-04 | Disposition: A | Discharge status: Home / Self Care | Payer: Medicare Other | Source: Ambulatory Visit | Attending: Internal Medicine | Admitting: Internal Medicine

## 2021-12-04 ENCOUNTER — Encounter: Payer: Self-pay | Admitting: Internal Medicine

## 2021-12-04 ENCOUNTER — Ambulatory Visit: Payer: Medicare Other | Admitting: Internal Medicine

## 2021-12-04 ENCOUNTER — Ambulatory Visit
Admission: RE | Admit: 2021-12-04 | Discharge: 2021-12-04 | Disposition: A | Discharge status: Home / Self Care | Payer: Medicare Other | Attending: Internal Medicine | Admitting: Internal Medicine

## 2021-12-04 ENCOUNTER — Ambulatory Visit (INDEPENDENT_AMBULATORY_CARE_PROVIDER_SITE_OTHER): Payer: Medicare Other | Admitting: Internal Medicine

## 2021-12-04 ENCOUNTER — Other Ambulatory Visit: Payer: Self-pay

## 2021-12-04 VITALS — BP 114/66 | HR 75 | Ht <= 58 in | Wt 142.0 lb

## 2021-12-04 DIAGNOSIS — I5032 Chronic diastolic (congestive) heart failure: Secondary | ICD-10-CM | POA: Diagnosis not present

## 2021-12-04 DIAGNOSIS — I4821 Permanent atrial fibrillation: Secondary | ICD-10-CM

## 2021-12-04 DIAGNOSIS — I2510 Atherosclerotic heart disease of native coronary artery without angina pectoris: Secondary | ICD-10-CM

## 2021-12-04 DIAGNOSIS — R0609 Other forms of dyspnea: Secondary | ICD-10-CM | POA: Diagnosis present

## 2021-12-04 DIAGNOSIS — E785 Hyperlipidemia, unspecified: Secondary | ICD-10-CM

## 2021-12-04 DIAGNOSIS — I10 Essential (primary) hypertension: Secondary | ICD-10-CM

## 2021-12-04 DIAGNOSIS — R0602 Shortness of breath: Secondary | ICD-10-CM | POA: Insufficient documentation

## 2021-12-04 NOTE — Progress Notes (Signed)
? ?Follow-up Outpatient Visit ?Date: 12/04/2021 ? ?Primary Care Provider: ?Perrin Maltese, MD ?[place] ?[place] [address] ? ?Chief Complaint: Follow-up CAD, HFpEF and a-fib ? ?HPI:  Karen Cunningham is a 88 y.o. female with history of coronary artery disease, HFpEF, permanent atrial fibrillation, hypertension, hyperlipidemia, chronic kidney disease, hypothyroidism, and chronic lower extremity edema, who presents for follow-up of coronary artery disease and atrial fibrillation.  I last saw her in September, at which time she was concerned about headaches.  She was doing well from a heart standpoint.  I encouraged her to use acetaminophen as needed for control of her headaches and to follow-up with her PCP if they did not improve. ? ?Today, Karen Cunningham reports that she feels fairly well.  She notes chronic exertional dyspnea with modest activity, which is unchanged from our last visit.  She is able to walk through [facility] for 2 hours but feels worn out the next few days.  She denies chest pain, palpitations, lightheadedness, edema, and falls.  She is still able to hear her heartbeat at times when she lies on her left ear.  She is tolerating her medications well.  She has not needed to use any additional prn furosemide. ? ?-------------------------------------------------------------------------------------------------- ? ?Past Medical History:  ?Diagnosis Date  ? (HFpEF) heart failure with preserved ejection fraction ([place])   ? a. 07/2016 Echo: EF 51%; b. 12/2016 Echo: EF 41%. Mod TR, mild PR; c. 09/2017 Echo Humphrey Rolls): EF 62%, Sev BAE. Mod TR, mild PR; d. 03/2019 Echo([facility]): EF 57%, Gr3 DD. Sev dil LA, mild dil RA. Midly dil Asc Ao. Trace AI. Mild to mod TR, mild PR. RVSP 63mHg.  ? Anemia   ? Arthritis   ? Carotid arterial disease ([place]   ? a. 10/2013 Mod to Sev RICA dzs. Mild dzs in L bulb; b. 26/9678CTA Carotids: LICA 593Y RICA w/o stenosis.  ? CKD (chronic kidney disease), stage III ([place]   ? Coronary artery disease    ? a. Several neg MV between 2008 and 2010; b. 2010 s/p PCI/DES to the mLAD; c. 10/2013 MV: Fixed apical defect, likely breast attenuation, no ischemia; d. 09/2014 MV: No ischemia; e. 03/2018 MV (Humphrey Rolls: no ischemia.  ? GERD (gastroesophageal reflux disease)   ? HLD (hyperlipidemia)   ? Hypertension   ? Hypothyroidism   ? Permanent atrial fibrillation ([place]   ? a. Dx ~ 2017. CHA2DS2VASc = 6-->coumadin.  ? ?Past Surgical History:  ?Procedure Laterality Date  ? ABDOMINAL HYSTERECTOMY    ? APPENDECTOMY    ? BACK SURGERY    ? CARDIAC CATHETERIZATION    ? x1 stent;[facility]  ? CORONARY STENT PLACEMENT    ? JOINT REPLACEMENT Left   ? hip  ? JOINT REPLACEMENT Bilateral   ? knees  ? NISSEN FUNDOPLICATION    ? THORACOTOMY    ? TOTAL HIP ARTHROPLASTY Right 03/09/2018  ? Procedure: TOTAL HIP ARTHROPLASTY;  Surgeon: HDereck Leep MD;  Location: [facility] ORS;  Service: Orthopedics;  Laterality: Right;  ? ? ?Current Meds  ?Medication Sig  ? acetaminophen (TYLENOL) 500 MG tablet Take 1 tablet (500 mg total) by mouth every 6 (six) hours as needed for moderate pain (pain).  ? atorvastatin (LIPITOR) 40 MG tablet TAKE 1 TABLET BY MOUTH AT BEDTIME  ? Cholecalciferol (VITAMIN D3) 1000 UNITS CAPS Take 1,000 Units by mouth daily.   ? diltiazem (DILACOR XR) 240 MG 24 hr capsule Take 1 capsule (240 mg total) by mouth daily.  ? Ferrous  Sulfate (IRON) 325 (65 FE) MG TABS Take 325 mg by mouth daily.   ? furosemide (LASIX) 20 MG tablet Take 1 tablet by mouth once daily  ? levothyroxine (SYNTHROID, LEVOTHROID) 88 MCG tablet Take 88 mcg by mouth daily before breakfast.  ? losartan (COZAAR) 50 MG tablet Take 0.5 tablets (25 mg total) by mouth daily.  ? Melatonin 10 MG TABS Take 10 mg by mouth at bedtime.  ? metoprolol tartrate (LOPRESSOR) 100 MG tablet Take 1 tablet (100 mg total) by mouth 2 (two) times daily.  ? omeprazole (PRILOSEC) 20 MG capsule Take 20 mg by mouth daily.  ? warfarin (COUMADIN) 2 MG tablet Take 1 tablet (2 mg total) by mouth every  evening.  ? ? ?Allergies: Codeine, Nsaids, Ibuprofen, and Prednisone ? ?Social History  ? ?Tobacco Use  ? Smoking status: Never  ? Smokeless tobacco: Never  ?Vaping Use  ? Vaping Use: Never used  ?Substance Use Topics  ? Alcohol use: Never  ? Drug use: Never  ? ? ?Family History  ?Problem Relation Age of Onset  ? Heart Problems Mother   ? Heart attack Mother   ? AAA (abdominal aortic aneurysm) Mother   ? Hypertension Mother   ? Heart Problems Father   ? ? ?Review of Systems: ?A 12-system review of systems was performed and was negative except as noted in the HPI. ? ?-------------------------------------------------------------------------------------------------- ? ?Physical Exam: ?BP 114/66 (BP Location: Left Arm, Patient Position: Sitting, Cuff Size: Normal)   Pulse 75   Ht 4' 10" (1.473 m)   Wt 142 lb (64.4 kg)   LMP  (LMP Unknown)   SpO2 97%   BMI 29.68 kg/m?  ? ?General:  NAD. ?Neck: No JVD or HJR. ?Lungs: Left basilar crackles noted with otherwise clear lungs and good air movement. ?Heart: Irregularly irregular rhythm with 2/6 systolic murmur. ?Abdomen: Soft, nontender, nondistended. ?Extremities: No lower extremity edema. ? ?EKG:  Atrial fibrillation.  Otherwise, no significant abnormality.  No change from prior tracing on 06/04/2021. ? ?Lab Results  ?Component Value Date  ? WBC 11.2 (H) 10/03/2020  ? HGB 12.2 10/03/2020  ? HCT 38.2 10/03/2020  ? MCV 90 10/03/2020  ? PLT 319 10/03/2020  ? ? ?Lab Results  ?Component Value Date  ? NA 139 11/29/2020  ? K 3.9 11/29/2020  ? CL 102 11/29/2020  ? CO2 26 11/29/2020  ? BUN 23 11/29/2020  ? CREATININE 1.17 (H) 11/29/2020  ? GLUCOSE 107 (H) 11/29/2020  ? ALT 15 02/25/2018  ? ? ?Outside labs (12/29/20220): ?CMP: Na 145, K 4.0, Cl 103, CO2 20, BUN 20, creatinine 0.90, glucose 101, Ca 9.7, AST 22, ALT 13, ALK Phos 141, Tbili 0.9, TP 7.3, Albumin 4.3 ? ?CBC: WBC 9.3, HGB 13.5, HCT 42.2, PLT 260 ? ?Lipid panel: Total cholesterol 152, HDL 60, LDL 73, TG  108 ? ?-------------------------------------------------------------------------------------------------- ? ?ASSESSMENT AND PLAN: ?Chronic HFpEF and dyspnea on exertion: ?Karen Cunningham reports stable NYHA class II-III symptoms.  Left basilar crackles are heard on exam today but she otherwise appears euvolemic on exam.  Prior CT chest in 2020 made not of bronchiectasis and opacities at the left lung base; query underlying ILD.  I have recommended a PA/lateral CXR today as well as repeat echocardiogram at Karen Cunningham's convenience.  We will defer medication changes, given stable symptoms.  If CXR suggests pulmonary edema, escalation of furosemide will need to be considered. ? ?Coronary artery disease: ?No angina reported.  LDL just above goal at 73 on last  check in 08/2021.  We will defer medication changes at this time given her age; continue atorvastatin 40 mg daily. ? ?Permanent atrial fibrillation: ?Ventricular rates adequately controlled on current regimen of diltiazem and metoprolol.  Continue indefinite anticoagulation with warfarin per our anticoagulation clinic. ? ?Hyperlipidemia: ?LDL just above goal on last check in 08/2021.  We will continue with atorvastatin 40 mg daily. ? ?Hypertension: ?BP well-controlled today.  Defer medication changes at this time. ? ?Follow-up: Return to clinic in 3 months. ? ?Nelva Bush, MD ?12/04/2021 ?10:17 AM ? ?

## 2021-12-04 NOTE — Patient Instructions (Signed)
Medication Instructions:  ? ?Your physician recommends that you continue on your current medications as directed. Please refer to the Current Medication list given to you today. ? ?*If you need a refill on your cardiac medications before your next appointment, please call your pharmacy* ? ? ?Lab Work: ? ?None ordered ? ?Testing/Procedures: ? ?1) Chest X-Ray: Please go to the medical mall at [facility] now, check in at Registration Desk.  ? ?2) Your physician has requested that you have an echocardiogram. Echocardiography is a painless test that uses sound waves to create images of your heart. It provides your doctor with information about the size and shape of your heart and how well your heart?s chambers and valves are working. This procedure takes approximately one hour. There are no restrictions for this procedure. ? ? ?Follow-Up: ?At [facility], you and your health needs are our priority.  As part of our continuing mission to provide you with exceptional heart care, we have created designated Provider Care Teams.  These Care Teams include your primary Cardiologist (physician) and Advanced Practice Providers (APPs -  Physician Assistants and Nurse Practitioners) who all work together to provide you with the care you need, when you need it. ? ?We recommend signing up for the patient portal called "MyChart".  Sign up information is provided on this After Visit Summary.  MyChart is used to connect with patients for Virtual Visits (Telemedicine).  Patients are able to view lab/test results, encounter notes, upcoming appointments, etc.  Non-urgent messages can be sent to your provider as well.   ?To learn more about what you can do with MyChart, go to [email or web address].   ? ?Your next appointment:   ?3 month(s) ? ?The format for your next appointment:   ?In Person ? ?Provider:   ?You may see Yvonne Kendall, MD or one of the following Advanced Practice Providers on your designated Care Team:   ?Nicolasa Ducking,  NP ?Eula Listen, PA-C ?Cadence Fransico Michael, PA-C ?

## 2021-12-16 ENCOUNTER — Encounter: Payer: Self-pay | Admitting: Internal Medicine

## 2021-12-17 ENCOUNTER — Other Ambulatory Visit: Payer: Self-pay

## 2021-12-17 MED ORDER — WARFARIN SODIUM 2 MG PO TABS: 2.0000 mg | ORAL_TABLET | Freq: Every evening | ORAL | 0 refills | Status: DC

## 2021-12-29 ENCOUNTER — Ambulatory Visit (INDEPENDENT_AMBULATORY_CARE_PROVIDER_SITE_OTHER): Payer: Medicare Other

## 2021-12-29 DIAGNOSIS — R0609 Other forms of dyspnea: Secondary | ICD-10-CM

## 2021-12-29 LAB — ECHOCARDIOGRAM COMPLETE
AR max vel: 1.35 cm2
AV Area VTI: 1.31 cm2
AV Area mean vel: 1.19 cm2
AV Mean grad: 10.3 mmHg
AV Peak grad: 16.7 mmHg
Ao pk vel: 2.04 m/s
Area-P 1/2: 5.38 cm2
Calc EF: 56 %
MV M vel: 4.2 m/s
MV Peak grad: 70.6 mmHg
S' Lateral: 2.65 cm
Single Plane A2C EF: 51.7 %
Single Plane A4C EF: 60.8 %

## 2022-01-06 ENCOUNTER — Other Ambulatory Visit: Payer: Self-pay | Admitting: Internal Medicine

## 2022-01-07 ENCOUNTER — Ambulatory Visit (INDEPENDENT_AMBULATORY_CARE_PROVIDER_SITE_OTHER): Payer: Medicare Other

## 2022-01-07 DIAGNOSIS — Z5181 Encounter for therapeutic drug level monitoring: Secondary | ICD-10-CM | POA: Diagnosis not present

## 2022-01-07 DIAGNOSIS — I4891 Unspecified atrial fibrillation: Secondary | ICD-10-CM | POA: Diagnosis not present

## 2022-01-07 LAB — POCT INR: INR: 3.2 — AB (ref 2.0–3.0)

## 2022-01-07 NOTE — Patient Instructions (Addendum)
-   continue dosage of warfarin of 1 tablet (2 mg) every day. Eat greens tonight. ?- recheck INR in 3 weeks ? ?* Try to see if you can have your greens on a schedule ?

## 2022-01-28 ENCOUNTER — Ambulatory Visit (INDEPENDENT_AMBULATORY_CARE_PROVIDER_SITE_OTHER): Payer: Medicare Other

## 2022-01-28 DIAGNOSIS — Z5181 Encounter for therapeutic drug level monitoring: Secondary | ICD-10-CM | POA: Diagnosis not present

## 2022-01-28 DIAGNOSIS — I4891 Unspecified atrial fibrillation: Secondary | ICD-10-CM

## 2022-01-28 LAB — POCT INR: INR: 2 (ref 2.0–3.0)

## 2022-01-28 NOTE — Patient Instructions (Signed)
-   continue dosage of warfarin of 1 tablet (2 mg) every day.  - recheck INR in 6 weeks  * Try to see if you can have your greens on a schedule 

## 2022-01-30 ENCOUNTER — Ambulatory Visit: Payer: Medicare Other | Admitting: Podiatry

## 2022-02-27 ENCOUNTER — Encounter: Payer: Self-pay | Admitting: Podiatry

## 2022-03-07 ENCOUNTER — Other Ambulatory Visit: Payer: Self-pay | Admitting: Internal Medicine

## 2022-03-07 DIAGNOSIS — I4821 Permanent atrial fibrillation: Secondary | ICD-10-CM

## 2022-03-09 ENCOUNTER — Other Ambulatory Visit: Payer: Self-pay | Admitting: Internal Medicine

## 2022-03-09 NOTE — Telephone Encounter (Signed)
Refill request

## 2022-03-11 ENCOUNTER — Ambulatory Visit (INDEPENDENT_AMBULATORY_CARE_PROVIDER_SITE_OTHER): Payer: Medicare Other

## 2022-03-11 DIAGNOSIS — I4891 Unspecified atrial fibrillation: Secondary | ICD-10-CM | POA: Diagnosis not present

## 2022-03-11 DIAGNOSIS — Z5181 Encounter for therapeutic drug level monitoring: Secondary | ICD-10-CM

## 2022-03-11 LAB — POCT INR: INR: 2.3 (ref 2.0–3.0)

## 2022-03-11 NOTE — Patient Instructions (Signed)
-   continue dosage of warfarin of 1 tablet (2 mg) every day.  - recheck INR in 6 weeks  * Try to see if you can have your greens on a schedule 

## 2022-03-12 NOTE — Progress Notes (Signed)
Faxed a request for Notes, EKG and Labs.

## 2022-03-19 ENCOUNTER — Ambulatory Visit (INDEPENDENT_AMBULATORY_CARE_PROVIDER_SITE_OTHER): Payer: Medicare Other | Admitting: Internal Medicine

## 2022-03-19 ENCOUNTER — Encounter: Payer: Self-pay | Admitting: Internal Medicine

## 2022-03-19 VITALS — BP 124/70 | HR 84 | Ht 60.0 in | Wt 139.0 lb

## 2022-03-19 DIAGNOSIS — I4821 Permanent atrial fibrillation: Secondary | ICD-10-CM

## 2022-03-19 DIAGNOSIS — I10 Essential (primary) hypertension: Secondary | ICD-10-CM | POA: Diagnosis not present

## 2022-03-19 DIAGNOSIS — I2510 Atherosclerotic heart disease of native coronary artery without angina pectoris: Secondary | ICD-10-CM

## 2022-03-19 DIAGNOSIS — I38 Endocarditis, valve unspecified: Secondary | ICD-10-CM

## 2022-03-19 DIAGNOSIS — I5032 Chronic diastolic (congestive) heart failure: Secondary | ICD-10-CM

## 2022-03-19 NOTE — Patient Instructions (Signed)

## 2022-03-19 NOTE — Progress Notes (Signed)
Follow-up Outpatient Visit Date: 03/19/2022  Primary Care Provider: Perrin Maltese, MD [place] [address]  Chief Complaint: Follow-up a-fib, valvular heart disease, and HFpEF  HPI:  Karen Cunningham is a 88 y.o. female with history of coronary artery disease, HFpEF, permanent atrial fibrillation, hypertension, hyperlipidemia, chronic kidney disease, hypothyroidism, and chronic lower extremity edema, who presents for follow-up of coronary artery disease and atrial fibrillation.  I last saw her in March, at which time she was feeling fairly well with stable exertional dyspnea with moderate activity.  She also continued to note pulsatile tinnitus with unremarkable ENT evaluation.  We agreed to obtain a chest radiograph to evaluate for parenchymal lung disease in the setting of her chronic exertional dyspnea and given mention of bronchiectasis on remote chest imaging this showed no acute abnormalities; scar versus atelectasis was seen at the left lung base.  Subsequent echo showed preserved LVEF with mild-moderate aortic stenosis and moderate mitral and tricuspid regurgitation.  Today, Karen Cunningham reports feeling about the same with stable exertional dyspnea.  Her weight is down about 5 pounds from prior visits and she has not had any significant edema.  She is scheduled to see Dr. Humphrey Rolls, her PCP, later today for follow-up visit and labs.  She denies chest pain and lightheadedness.  She occasionally feels brief palpitations, especially when lying in bed.  Last 2 INR's have been therapeutic.  Karen Cunningham denies bleeding.  --------------------------------------------------------------------------------------------------  Past Medical History:  Diagnosis Date   (HFpEF) heart failure with preserved ejection fraction ([place])    a. 07/2016 Echo: EF 51%; b. 12/2016 Echo: EF 41%. Mod TR, mild PR; c. 09/2017 Echo Humphrey Rolls): EF 62%, Sev BAE. Mod TR, mild PR; d. 03/2019 Echo([facility]): EF 57%, Gr3 DD. Sev dil LA,  mild dil RA. Midly dil Asc Ao. Trace AI. Mild to mod TR, mild PR. RVSP 57mmHg.   Anemia    Arthritis    Carotid arterial disease ([place])    a. 10/2013 Mod to Sev RICA dzs. Mild dzs in L bulb; b. [id number] CTA Carotids: LICA [id number], RICA w/o stenosis.   CKD (chronic kidney disease), stage III (HCC)    Coronary artery disease    a. Several neg MV between 2008 and 2010; b. 2010 s/p PCI/DES to the mLAD; c. 10/2013 MV: Fixed apical defect, likely breast attenuation, no ischemia; d. 09/2014 MV: No ischemia; e. 03/2018 MV Humphrey Rolls): no ischemia.   GERD (gastroesophageal reflux disease)    HLD (hyperlipidemia)    Hypertension    Hypothyroidism    Permanent atrial fibrillation ([place])    a. Dx ~ 2017. CHA2DS2VASc = 6-->coumadin.   Past Surgical History:  Procedure Laterality Date   ABDOMINAL HYSTERECTOMY     APPENDECTOMY     BACK SURGERY     CARDIAC CATHETERIZATION     x1 stent;[facility]   CORONARY STENT PLACEMENT     JOINT REPLACEMENT Left    hip   JOINT REPLACEMENT Bilateral    knees   NISSEN FUNDOPLICATION     THORACOTOMY     TOTAL HIP ARTHROPLASTY Right 03/09/2018   Procedure: TOTAL HIP ARTHROPLASTY;  Surgeon: Dereck Leep, MD;  Location: [facility] ORS;  Service: Orthopedics;  Laterality: Right;    Current Meds  Medication Sig   acetaminophen (TYLENOL) 500 MG tablet Take 1 tablet (500 mg total) by mouth every 6 (six) hours as needed for moderate pain (pain).   atorvastatin (LIPITOR) 40 MG tablet TAKE 1 TABLET BY MOUTH AT BEDTIME  Cholecalciferol (VITAMIN D3) 1000 UNITS CAPS Take 1,000 Units by mouth daily.    DILT-XR 240 MG 24 hr capsule Take 1 capsule by mouth once daily   Ferrous Sulfate (IRON) 325 (65 FE) MG TABS Take 325 mg by mouth daily.    furosemide (LASIX) 20 MG tablet Take 1 tablet by mouth once daily   levothyroxine (SYNTHROID, LEVOTHROID) 88 MCG tablet Take 88 mcg by mouth daily before breakfast.   losartan (COZAAR) 50 MG tablet Take 0.5 tablets (25 mg total) by mouth daily.   Melatonin 10  MG TABS Take 10 mg by mouth at bedtime.   metoprolol tartrate (LOPRESSOR) 100 MG tablet Take 1 tablet by mouth twice daily   omeprazole (PRILOSEC) 20 MG capsule Take 20 mg by mouth daily.   warfarin (COUMADIN) 2 MG tablet Take 1 tablet by mouth in the evening    Allergies: Codeine, Nsaids, Ibuprofen, and Prednisone  Social History   Tobacco Use   Smoking status: Never   Smokeless tobacco: Never  Vaping Use   Vaping Use: Never used  Substance Use Topics   Alcohol use: Never   Drug use: Never    Family History  Problem Relation Age of Onset   Heart Problems Mother    Heart attack Mother    AAA (abdominal aortic aneurysm) Mother    Hypertension Mother    Heart Problems Father     Review of Systems: A 12-system review of systems was performed and was negative except as noted in the HPI.  --------------------------------------------------------------------------------------------------  Physical Exam: BP 124/70 (BP Location: Left Arm, Patient Position: Sitting, Cuff Size: Normal)   Pulse 84   Ht 5' (1.524 m)   Wt 139 lb (63 kg)   LMP  (LMP Unknown)   SpO2 97%   BMI 27.15 kg/m   General:  NAD. Neck: No JVD or HJR. Lungs: Clear to auscultation bilaterally without wheezes or crackles. Heart: Irregularly irregular rhythm with 2/6 systolic murmur. Abdomen: Soft, nontender, nondistended. Extremities: No lower extremity edema.  EKG:  Atrial fibrillation.  No significant change from prior tracing on 12/04/2021.  Lab Results  Component Value Date   WBC 11.2 (H) 10/03/2020   HGB 12.2 10/03/2020   HCT 38.2 10/03/2020   MCV 90 10/03/2020   PLT 319 10/03/2020    Lab Results  Component Value Date   NA 139 11/29/2020   K 3.9 11/29/2020   CL 102 11/29/2020   CO2 26 11/29/2020   BUN 23 11/29/2020   CREATININE 1.17 (H) 11/29/2020   GLUCOSE 107 (H) 11/29/2020   ALT 15 02/25/2018     --------------------------------------------------------------------------------------------------  ASSESSMENT AND PLAN: Permanent atrial fibrillation: Ventricular rates remain reasonably well controlled.  Chronic exertional dyspnea is likely a combination of atrial fibrillation, HFpEF, and valvular heart disease as well as age.  We discussed rhythm control options, though I am pessimistic about long-term maintenance of sinus rhythm.  We will therefore continue metoprolol and diltiazem for rate control as well as indefinite anticoagulation with warfarin managed through our anticoagulation clinic.  I have asked Karen Cunningham to have Dr. Welton Flakes fax labs drawn today to our office for review.  Valvular heart disease: Recent echo showed stable mild-moderate AAS as well as moderate MR and TR.  Karen Cunningham appears euvolemic.  Continue clinical follow-up.  Chronic HFpEF: Karen Cunningham appears euvolemic on exam with stable NYHA class II-III symptoms.  Her weight is stable today.  No medication changes at this time.  Coronary artery disease: No angina  reported.  Continue atorvastatin and warfarin and lieu of aspirin to prevent progression of disease.  Follow-up: Return to clinic in 6 months.  Yvonne Kendall, MD 03/19/2022 9:06 AM

## 2022-03-20 ENCOUNTER — Encounter: Payer: Self-pay | Admitting: Internal Medicine

## 2022-03-27 ENCOUNTER — Ambulatory Visit: Payer: Medicare Other | Admitting: Podiatry

## 2022-03-31 ENCOUNTER — Ambulatory Visit: Payer: Medicare Other | Admitting: Podiatry

## 2022-04-21 ENCOUNTER — Ambulatory Visit (INDEPENDENT_AMBULATORY_CARE_PROVIDER_SITE_OTHER): Payer: Medicare Other | Admitting: Podiatry

## 2022-04-21 DIAGNOSIS — M79609 Pain in unspecified limb: Secondary | ICD-10-CM | POA: Diagnosis not present

## 2022-04-21 DIAGNOSIS — B351 Tinea unguium: Secondary | ICD-10-CM | POA: Diagnosis not present

## 2022-04-21 NOTE — Progress Notes (Signed)
   SUBJECTIVE Patient presents to office today complaining of elongated, thickened nails that cause pain while ambulating in shoes.  Patient is unable to trim their own nails. Patient is here for further evaluation and treatment.  Past Medical History:  Diagnosis Date   (HFpEF) heart failure with preserved ejection fraction (HCC)    a. 07/2016 Echo: EF 51%; b. 12/2016 Echo: EF 41%. Mod TR, mild PR; c. 09/2017 Echo ([facility]): EF 62%, Sev BAE. Mod TR, mild PR; d. 03/2019 Echo([facility]): EF 57%, Gr3 DD. Sev dil LA, mild dil RA. Midly dil Asc Ao. Trace AI. Mild to mod TR, mild PR. RVSP 39mmHg.   Anemia    Arthritis    Carotid arterial disease (HCC)    a. 10/2013 Mod to Sev RICA dzs. Mild dzs in L bulb; b. 10/2013 CTA Carotids: LICA 50p, RICA w/o stenosis.   CKD (chronic kidney disease), stage III (HCC)    Coronary artery disease    a. Several neg MV between 2008 and 2010; b. 2010 s/p PCI/DES to the mLAD; c. 10/2013 MV: Fixed apical defect, likely breast attenuation, no ischemia; d. 09/2014 MV: No ischemia; e. 03/2018 MV ([facility]han): no ischemia.   GERD (gastroesophageal reflux disease)    HLD (hyperlipidemia)    Hypertension    Hypothyroidism    Permanent atrial fibrillation (HCC)    a. Dx ~ 2017. CHA2DS2VASc = 6-->coumadin.    OBJECTIVE General Patient is awake, alert, and oriented x 3 and in no acute distress. Derm Skin is dry and supple bilateral. Negative open lesions or macerations. Remaining integument unremarkable. Nails are tender, long, thickened and dystrophic with subungual debris, consistent with onychomycosis, 1-5 bilateral. No signs of infection noted. Vasc  DP and PT pedal pulses palpable bilaterally. Temperature gradient within normal limits.  Neuro Epicritic and protective threshold sensation grossly intact bilaterally.  Musculoskeletal Exam No symptomatic pedal deformities noted bilateral. Muscular strength within normal limits.  ASSESSMENT 1.  Pain due to onychomycosis of toenails  both  PLAN OF CARE 1. Patient evaluated today.  2. Instructed to maintain good pedal hygiene and foot care.  3. Mechanical debridement of nails 1-5 bilaterally performed using a nail nipper. Filed with dremel without incident.  4. Return to clinic in 3 mos.   *goes by Miss Elz[place]  Candice Lunney M. Soley Harriss, DPM [facility]  Dr. Glendine Swetz M. Lloyd Cullinan, DPM    [address]                                     [place], [place] [address]                Office [phone]  Fax [phone]   

## 2022-04-22 ENCOUNTER — Ambulatory Visit (INDEPENDENT_AMBULATORY_CARE_PROVIDER_SITE_OTHER): Payer: Medicare Other

## 2022-04-22 DIAGNOSIS — Z5181 Encounter for therapeutic drug level monitoring: Secondary | ICD-10-CM | POA: Diagnosis not present

## 2022-04-22 DIAGNOSIS — I4891 Unspecified atrial fibrillation: Secondary | ICD-10-CM | POA: Diagnosis not present

## 2022-04-22 LAB — POCT INR: INR: 2.2 (ref 2.0–3.0)

## 2022-04-22 NOTE — Patient Instructions (Signed)
-   continue dosage of warfarin of 1 tablet (2 mg) every day.  - recheck INR in 6 weeks  * Try to see if you can have your greens on a schedule 

## 2022-06-02 ENCOUNTER — Other Ambulatory Visit: Payer: Self-pay | Admitting: Internal Medicine

## 2022-06-02 DIAGNOSIS — I4821 Permanent atrial fibrillation: Secondary | ICD-10-CM

## 2022-06-03 ENCOUNTER — Ambulatory Visit: Payer: Medicare Other | Attending: Internal Medicine

## 2022-06-03 DIAGNOSIS — Z5181 Encounter for therapeutic drug level monitoring: Secondary | ICD-10-CM | POA: Insufficient documentation

## 2022-06-03 DIAGNOSIS — I4891 Unspecified atrial fibrillation: Secondary | ICD-10-CM | POA: Diagnosis not present

## 2022-06-03 LAB — POCT INR: INR: 2.5 (ref 2.0–3.0)

## 2022-06-03 NOTE — Telephone Encounter (Signed)
Prescription refill request received for warfarin Lov: 03/19/22 (End)  Next INR check: 07/15/22 Warfarin tablet strength: 2mg   Appropriate dose and refill sent to requested pharmacy

## 2022-06-03 NOTE — Patient Instructions (Signed)
-   continue dosage of warfarin of 1 tablet (2 mg) every day.  - recheck INR in 6 weeks  * Try to see if you can have your greens on a schedule 

## 2022-06-03 NOTE — Telephone Encounter (Signed)
Refill Request.  

## 2022-06-23 ENCOUNTER — Encounter: Payer: Self-pay | Admitting: Podiatry

## 2022-07-15 ENCOUNTER — Ambulatory Visit: Payer: Medicare Other | Attending: Internal Medicine

## 2022-07-15 DIAGNOSIS — I4891 Unspecified atrial fibrillation: Secondary | ICD-10-CM | POA: Diagnosis not present

## 2022-07-15 DIAGNOSIS — Z5181 Encounter for therapeutic drug level monitoring: Secondary | ICD-10-CM

## 2022-07-15 LAB — POCT INR: INR: 2.6 (ref 2.0–3.0)

## 2022-07-15 NOTE — Patient Instructions (Signed)
-   continue dosage of warfarin of 1 tablet (2 mg) every day.  - recheck INR in 6 weeks  * Try to see if you can have your greens on a schedule

## 2022-07-21 ENCOUNTER — Ambulatory Visit: Payer: Medicare Other | Admitting: Podiatry

## 2022-08-04 ENCOUNTER — Ambulatory Visit (INDEPENDENT_AMBULATORY_CARE_PROVIDER_SITE_OTHER): Payer: Medicare Other | Admitting: Podiatry

## 2022-08-04 DIAGNOSIS — B351 Tinea unguium: Secondary | ICD-10-CM | POA: Diagnosis not present

## 2022-08-04 DIAGNOSIS — M79609 Pain in unspecified limb: Secondary | ICD-10-CM | POA: Diagnosis not present

## 2022-08-04 NOTE — Progress Notes (Signed)
   SUBJECTIVE Patient presents to office today complaining of elongated, thickened nails that cause pain while ambulating in shoes.  Patient is unable to trim their own nails. Patient is here for further evaluation and treatment.  Past Medical History:  Diagnosis Date   (HFpEF) heart failure with preserved ejection fraction (HCC)    a. 07/2016 Echo: EF 51%; b. 12/2016 Echo: EF 41%. Mod TR, mild PR; c. 09/2017 Echo ([facility]): EF 62%, Sev BAE. Mod TR, mild PR; d. 03/2019 Echo([facility]): EF 57%, Gr3 DD. Sev dil LA, mild dil RA. Midly dil Asc Ao. Trace AI. Mild to mod TR, mild PR. RVSP 39mmHg.   Anemia    Arthritis    Carotid arterial disease (HCC)    a. 10/2013 Mod to Sev RICA dzs. Mild dzs in L bulb; b. 10/2013 CTA Carotids: LICA 50p, RICA w/o stenosis.   CKD (chronic kidney disease), stage III (HCC)    Coronary artery disease    a. Several neg MV between 2008 and 2010; b. 2010 s/p PCI/DES to the mLAD; c. 10/2013 MV: Fixed apical defect, likely breast attenuation, no ischemia; d. 09/2014 MV: No ischemia; e. 03/2018 MV ([facility]): no ischemia.   GERD (gastroesophageal reflux disease)    HLD (hyperlipidemia)    Hypertension    Hypothyroidism    Permanent atrial fibrillation (HCC)    a. Dx ~ 2017. CHA2DS2VASc = 6-->coumadin.    OBJECTIVE General Patient is awake, alert, and oriented x 3 and in no acute distress. Derm Skin is dry and supple bilateral. Negative open lesions or macerations. Remaining integument unremarkable. Nails are tender, long, thickened and dystrophic with subungual debris, consistent with onychomycosis, 1-5 bilateral. No signs of infection noted. Vasc  DP and PT pedal pulses palpable bilaterally. Temperature gradient within normal limits.  Neuro Epicritic and protective threshold sensation grossly intact bilaterally.  Musculoskeletal Exam No symptomatic pedal deformities noted bilateral. Muscular strength within normal limits.  ASSESSMENT 1.  Pain due to onychomycosis of toenails  both  PLAN OF CARE 1. Patient evaluated today.  2. Instructed to maintain good pedal hygiene and foot care.  3. Mechanical debridement of nails 1-5 bilaterally performed using a nail nipper. Filed with dremel without incident.  4. Return to clinic in 3 mos.   *goes by Miss Karen Cunningham  Karen Cunningham, DPM [facility]  Dr. Jerilee Cunningham M. Karen Cunningham, DPM    [address]                                     [place], [place] [address]                Office [phone]  Fax [phone]   

## 2022-08-22 ENCOUNTER — Other Ambulatory Visit: Payer: Self-pay | Admitting: Internal Medicine

## 2022-08-22 DIAGNOSIS — I4821 Permanent atrial fibrillation: Secondary | ICD-10-CM

## 2022-08-24 NOTE — Telephone Encounter (Signed)
Prescription refill request received for warfarin Lov: 03/19/22 (End)  Next INR check: 08/26/22 Warfarin tablet strength: 2mg   Appropriate dose and refill sent to requested pharmacy.

## 2022-08-26 ENCOUNTER — Ambulatory Visit: Payer: Medicare Other | Attending: Internal Medicine

## 2022-08-26 DIAGNOSIS — Z5181 Encounter for therapeutic drug level monitoring: Secondary | ICD-10-CM

## 2022-08-26 DIAGNOSIS — I4891 Unspecified atrial fibrillation: Secondary | ICD-10-CM

## 2022-08-26 LAB — POCT INR: INR: 3.1 — AB (ref 2.0–3.0)

## 2022-08-26 NOTE — Patient Instructions (Signed)
-   continue dosage of warfarin of 1 tablet (2 mg) every day.  - recheck INR in 6 weeks  * Try to see if you can have your greens on a schedule

## 2022-09-02 ENCOUNTER — Encounter: Payer: Self-pay | Admitting: *Deleted

## 2022-09-10 ENCOUNTER — Ambulatory Visit: Payer: Medicare Other | Admitting: Internal Medicine

## 2022-10-07 ENCOUNTER — Ambulatory Visit: Payer: Medicare Other | Attending: Internal Medicine

## 2022-10-07 DIAGNOSIS — Z5181 Encounter for therapeutic drug level monitoring: Secondary | ICD-10-CM

## 2022-10-07 DIAGNOSIS — I4891 Unspecified atrial fibrillation: Secondary | ICD-10-CM

## 2022-10-07 LAB — POCT INR: INR: 3.3 — AB (ref 2.0–3.0)

## 2022-10-07 NOTE — Patient Instructions (Signed)
-  TAKE 0.5 TODAY AND THEN DECREASE TO 1 tablet (2 mg) every day, EXCEPT 0.5 TABLET ON WEDNESDAY. - recheck INR in 3 weeks  * Try to see if you can have your greens on a schedule

## 2022-10-27 NOTE — Progress Notes (Deleted)
Follow-up Outpatient Visit Date: 10/28/2022  Primary Care Provider: Perrin Maltese, MD [address] [place] [address]  Chief Complaint: ***  HPI:  Karen Cunningham is a 89 y.o. female with history of coronary artery disease, HFpEF, permanent atrial fibrillation, hypertension, hyperlipidemia, chronic kidney disease, hypothyroidism, and chronic lower extremity edema, who presents for follow-up of coronary artery disease, HFpEF, and atrial fibrillation.  I last saw her in 02/2022, at which time she reported stable exertional dyspnea.  She noted a 5 pound weight drop from our prior visit and resolution of leg edema.  We did not make any medication changes or pursue additional testing.  --------------------------------------------------------------------------------------------------  Past Medical History:  Diagnosis Date   (HFpEF) heart failure with preserved ejection fraction ([place])    a. 07/2016 Echo: EF 51%; b. 12/2016 Echo: EF 41%. Mod TR, mild PR; c. 09/2017 Echo [facility]): EF 62%, Sev BAE. Mod TR, mild PR; d. 03/2019 Echo([facility]): EF 57%, Gr3 DD. Sev dil LA, mild dil RA. Midly dil Asc Ao. Trace AI. Mild to mod TR, mild PR. RVSP 79mmHg.   Anemia    Arthritis    Carotid arterial disease ([place])    a. 10/2013 Mod to Sev RICA dzs. Mild dzs in L bulb; b. 02/7671 CTA Carotids: LICA 09O, RICA w/o stenosis.   CKD (chronic kidney disease), stage III (HCC)    Coronary artery disease    a. Several neg MV between 2008 and 2010; b. 2010 s/p PCI/DES to the mLAD; c. 10/2013 MV: Fixed apical defect, likely breast attenuation, no ischemia; d. 09/2014 MV: No ischemia; e. 03/2018 MV [facility]): no ischemia.   GERD (gastroesophageal reflux disease)    HLD (hyperlipidemia)    Hypertension    Hypothyroidism    Permanent atrial fibrillation ([place])    a. Dx ~ 2017. CHA2DS2VASc = 6-->coumadin.   Past Surgical History:  Procedure Laterality Date   ABDOMINAL HYSTERECTOMY     APPENDECTOMY     BACK SURGERY     CARDIAC  CATHETERIZATION     x1 stent;[facility]   CORONARY STENT PLACEMENT     JOINT REPLACEMENT Left    hip   JOINT REPLACEMENT Bilateral    knees   NISSEN FUNDOPLICATION     THORACOTOMY     TOTAL HIP ARTHROPLASTY Right 03/09/2018   Procedure: TOTAL HIP ARTHROPLASTY;  Surgeon: Dereck Leep, MD;  Location: [facility] ORS;  Service: Orthopedics;  Laterality: Right;    No outpatient medications have been marked as taking for the 10/28/22 encounter (Appointment) with Shavana Calder, Harrell Gave, MD.    Allergies: Codeine, Nsaids, Ibuprofen, and Prednisone  Social History   Tobacco Use   Smoking status: Never   Smokeless tobacco: Never  Vaping Use   Vaping Use: Never used  Substance Use Topics   Alcohol use: Never   Drug use: Never    Family History  Problem Relation Age of Onset   Heart Problems Mother    Heart attack Mother    AAA (abdominal aortic aneurysm) Mother    Hypertension Mother    Heart Problems Father     Review of Systems: A 12-system review of systems was performed and was negative except as noted in the HPI.  --------------------------------------------------------------------------------------------------  Physical Exam: LMP  (LMP Unknown)   General:  NAD. Neck: No JVD or HJR. Lungs: Clear to auscultation bilaterally without wheezes or crackles. Heart: Regular rate and rhythm without murmurs, rubs, or gallops. Abdomen: Soft, nontender, nondistended. Extremities: No lower extremity edema.  EKG:  ***  Lab Results  Component Value Date   WBC 11.2 (H) 10/03/2020   HGB 12.2 10/03/2020   HCT 38.2 10/03/2020   MCV 90 10/03/2020   PLT 319 10/03/2020    Lab Results  Component Value Date   NA 139 11/29/2020   K 3.9 11/29/2020   CL 102 11/29/2020   CO2 26 11/29/2020   BUN 23 11/29/2020   CREATININE 1.17 (H) 11/29/2020   GLUCOSE 107 (H) 11/29/2020   ALT 15 02/25/2018    No results found for: "CHOL", "HDL", "LDLCALC", "LDLDIRECT", "TRIG",  "CHOLHDL"  --------------------------------------------------------------------------------------------------  ASSESSMENT AND PLAN: Karen Bush, MD 10/27/2022 9:13 PM

## 2022-10-28 ENCOUNTER — Ambulatory Visit: Payer: Medicare Other

## 2022-10-28 ENCOUNTER — Telehealth: Payer: Self-pay

## 2022-10-28 ENCOUNTER — Ambulatory Visit: Payer: Medicare Other | Admitting: Internal Medicine

## 2022-10-28 DIAGNOSIS — U071 COVID-19: Secondary | ICD-10-CM

## 2022-10-28 MED ORDER — PAXLOVID (150/100) 10 X 150 MG & 10 X 100MG PO TBPK: 3.0000 | ORAL_TABLET | Freq: Two times a day (BID) | ORAL | 0 refills | Status: DC

## 2022-10-28 NOTE — Telephone Encounter (Signed)
Pt's daughter called and left vm regarding pt having headache, congestion, cough- symptoms started yesterday, did 2 at home tests and both came back positive. Asked if we can send paxlovid for pt? Please advise

## 2022-10-28 NOTE — Telephone Encounter (Signed)
-----   Message from Colletta [place] sent at 10/28/2022  2:10 PM EST ----- Regarding: Rx needed Pts daughter in law Fallan Mccarey called stating that pt started feeling bad on Monday & today did 2 @home  covid test and both positive. Please send rx to [facility] on [address]. Please adv

## 2022-10-29 ENCOUNTER — Other Ambulatory Visit: Payer: Self-pay | Admitting: Family

## 2022-10-29 DIAGNOSIS — U071 COVID-19: Secondary | ICD-10-CM

## 2022-11-04 ENCOUNTER — Telehealth: Payer: Self-pay | Admitting: Internal Medicine

## 2022-11-04 ENCOUNTER — Ambulatory Visit: Payer: Medicare Other

## 2022-11-04 ENCOUNTER — Encounter: Payer: Self-pay | Admitting: Internal Medicine

## 2022-11-04 NOTE — Telephone Encounter (Signed)
Yes, should be ok

## 2022-11-04 NOTE — Telephone Encounter (Signed)
Pt was late for coumadin appt due to pt's schedule and had to reschedule. Was not able to reschedule until 2/28. Pt's daughter is asking if it is okay to wait this long. Last Coumadin check was 1/17.

## 2022-11-10 ENCOUNTER — Encounter: Payer: Self-pay | Admitting: Podiatry

## 2022-11-10 ENCOUNTER — Ambulatory Visit (INDEPENDENT_AMBULATORY_CARE_PROVIDER_SITE_OTHER): Payer: Medicare Other | Admitting: Podiatry

## 2022-11-10 VITALS — BP 137/67 | HR 72

## 2022-11-10 DIAGNOSIS — M79609 Pain in unspecified limb: Secondary | ICD-10-CM

## 2022-11-10 DIAGNOSIS — B351 Tinea unguium: Secondary | ICD-10-CM | POA: Diagnosis not present

## 2022-11-10 NOTE — Progress Notes (Signed)
   SUBJECTIVE Patient presents to office today complaining of elongated, thickened nails that cause pain while ambulating in shoes.  Patient is unable to trim their own nails. Patient is here for further evaluation and treatment.  Past Medical History:  Diagnosis Date   (HFpEF) heart failure with preserved ejection fraction ([place])    a. 07/2016 Echo: EF 51%; b. 12/2016 Echo: EF 41%. Mod TR, mild PR; c. 09/2017 Echo [facility]): EF 62%, Sev BAE. Mod TR, mild PR; d. 03/2019 Echo([facility]): EF 57%, Gr3 DD. Sev dil LA, mild dil RA. Midly dil Asc Ao. Trace AI. Mild to mod TR, mild PR. RVSP 48mHg.   Anemia    Arthritis    Carotid arterial disease ([place]    a. 10/2013 Mod to Sev RICA dzs. Mild dzs in L bulb; b. [id number] Carotids: LICA [id number] RICA w/o stenosis.   CKD (chronic kidney disease), stage III (HCC)    Coronary artery disease    a. Several neg MV between 2008 and 2010; b. 2010 s/p PCI/DES to the mLAD; c. 10/2013 MV: Fixed apical defect, likely breast attenuation, no ischemia; d. 09/2014 MV: No ischemia; e. 03/2018 MV ([facility]: no ischemia.   GERD (gastroesophageal reflux disease)    HLD (hyperlipidemia)    Hypertension    Hypothyroidism    Permanent atrial fibrillation ([place]    a. Dx ~ 2017. CHA2DS2VASc = 6-->coumadin.    OBJECTIVE General Patient is awake, alert, and oriented x 3 and in no acute distress. Derm Skin is dry and supple bilateral. Negative open lesions or macerations. Remaining integument unremarkable. Nails are tender, long, thickened and dystrophic with subungual debris, consistent with onychomycosis, 1-5 bilateral. No signs of infection noted. Vasc  DP and PT pedal pulses palpable bilaterally. Temperature gradient within normal limits.  Neuro Epicritic and protective threshold sensation grossly intact bilaterally.  Musculoskeletal Exam No symptomatic pedal deformities noted bilateral. Muscular strength within normal limits.  ASSESSMENT 1.  Pain due to onychomycosis of toenails  both  PLAN OF CARE 1. Patient evaluated today.  2. Instructed to maintain good pedal hygiene and foot care.  3. Mechanical debridement of nails 1-5 bilaterally performed using a nail nipper. Filed with dremel without incident.  4. Return to clinic in 3 mos.   *goes by Miss CGeorgie Chard DPM [facility]  Dr. BEdrick Kins DPM    [address] [place] [address]               Office [phone] Fax [phone]

## 2022-11-15 ENCOUNTER — Other Ambulatory Visit: Payer: Self-pay | Admitting: Internal Medicine

## 2022-11-15 DIAGNOSIS — I10 Essential (primary) hypertension: Secondary | ICD-10-CM

## 2022-11-18 ENCOUNTER — Ambulatory Visit: Payer: Medicare Other | Attending: Internal Medicine

## 2022-11-18 DIAGNOSIS — Z5181 Encounter for therapeutic drug level monitoring: Secondary | ICD-10-CM | POA: Diagnosis present

## 2022-11-18 DIAGNOSIS — I4891 Unspecified atrial fibrillation: Secondary | ICD-10-CM | POA: Diagnosis present

## 2022-11-18 LAB — POCT INR: INR: 2.4 (ref 2.0–3.0)

## 2022-11-18 NOTE — Patient Instructions (Signed)
Description   Continue on same dosage of Warfarin 1 tablet (2 mg) every day, EXCEPT 0.5 TABLET ON WEDNESDAYS. Recheck INR in 4 weeks  * Try to see if you can have your greens on a schedule

## 2022-11-23 ENCOUNTER — Telehealth: Payer: Self-pay | Admitting: Internal Medicine

## 2022-11-23 NOTE — Telephone Encounter (Signed)
It appears that Karen Cunningham was last seen in our office in 02/2022 and cancelled 6 month f/u appointments.  I recommend that we schedule her to see me, the DOD, or APP this week.  If her symptoms worsen in the meantime, she should go to urgent care or the ED for further evaluation.  Karen Bush, MD [facility]

## 2022-11-23 NOTE — Telephone Encounter (Signed)
Spoke with patient's daughter Olin Hauser and she stated that her mother's blood pressure and heart rates have been sporadic all day ranging from 150/96 164/125 Hr 95, then 172/133 140/81 last 60 mins was 184/103 Hr 74. Olin Hauser stated that normally she eventually "levels out" but that hasn't happened today and the patient stated that she "just feels off in the head" and also had some slight dizziness yesterday and today. Patient denies chest pain, sob, nausea, vomiting and uncontrolled pain. Please advise.

## 2022-11-23 NOTE — Telephone Encounter (Signed)
Pt c/o BP issue: STAT if pt c/o blurred vision, one-sided weakness or slurred speech  1. What are your last 5 BP readings? 9:45 AM this morning 164/125 Hr 95, then 172/133 140/81 last 30 mins was 184/103 Hr 74  2. Are you having any other symptoms (ex. Dizziness, headache, blurred vision, passed out)? Felt off and head felt funny, maybe a little dizzy   3. What is your BP issue? Bp has been high and sporadic. Pt's daughter stated that the pt's hr has been around 85-106. Pt's daughter wants to know if they should be concerned about the pts bp and hr

## 2022-11-24 ENCOUNTER — Encounter: Payer: Self-pay | Admitting: Internal Medicine

## 2022-11-24 NOTE — Telephone Encounter (Signed)
Pt's daughter in law advised of MD's recommendations and verbalized understanding. She reported pt is currently asymptomatic and BP has decreased to 150's.  Appointment scheduled for 3/13 for further evaluations.

## 2022-11-24 NOTE — Telephone Encounter (Signed)
Please see updated phone encounter

## 2022-11-26 ENCOUNTER — Telehealth: Payer: Self-pay | Admitting: Internal Medicine

## 2022-11-26 NOTE — Telephone Encounter (Signed)
Pt c/o BP issue: STAT if pt c/o blurred vision, one-sided weakness or slurred speech  1. What are your last 5 BP readings? 199/118; 54; 136/90; 56; 170/103; 71  2. Are you having any other symptoms (ex. Dizziness, headache, blurred vision, passed out)? No; do not feel like herself though  3. What is your BP issue? BP is high

## 2022-11-26 NOTE — Telephone Encounter (Signed)
Spoke with patient's daughter-in-law Jeannene Patella and she stated that her mother is "holding steady with her blood pressure it's just holding a little high." Pam stated that her condition/symptoms have not worsened, she is still completing her ADLs and her gardening as usual. Pam did state that the patient has been taking her BP every 15 minutes. Informed her that constantly taking it can actually cause it to rise because of stress and anxiety. Instructed her to take it only a couple times a day and to take it 2 hours after taking medications as prescribed. Pam stated that she will inform her of that and they are just waiting for appointment with Dr. Saunders Revel on 12/02/22.

## 2022-12-02 ENCOUNTER — Encounter: Payer: Self-pay | Admitting: Internal Medicine

## 2022-12-02 ENCOUNTER — Other Ambulatory Visit
Admission: RE | Admit: 2022-12-02 | Discharge: 2022-12-02 | Disposition: A | Discharge status: Home / Self Care | Payer: Medicare Other | Source: Home / Self Care | Attending: Internal Medicine | Admitting: Internal Medicine

## 2022-12-02 ENCOUNTER — Ambulatory Visit: Payer: Medicare Other | Attending: Internal Medicine | Admitting: Internal Medicine

## 2022-12-02 ENCOUNTER — Ambulatory Visit
Admission: RE | Admit: 2022-12-02 | Discharge: 2022-12-02 | Disposition: A | Discharge status: Home / Self Care | Payer: Medicare Other | Source: Ambulatory Visit | Attending: Internal Medicine | Admitting: Internal Medicine

## 2022-12-02 ENCOUNTER — Encounter
Admission: RE | Admit: 2022-12-02 | Discharge: 2022-12-02 | Disposition: A | Discharge status: Home / Self Care | Payer: Medicare Other | Source: Home / Self Care | Attending: Internal Medicine | Admitting: Internal Medicine

## 2022-12-02 VITALS — BP 108/58 | HR 48 | Ht 59.0 in | Wt 136.0 lb

## 2022-12-02 DIAGNOSIS — R0989 Other specified symptoms and signs involving the circulatory and respiratory systems: Secondary | ICD-10-CM | POA: Insufficient documentation

## 2022-12-02 DIAGNOSIS — I38 Endocarditis, valve unspecified: Secondary | ICD-10-CM | POA: Insufficient documentation

## 2022-12-02 DIAGNOSIS — I4821 Permanent atrial fibrillation: Secondary | ICD-10-CM | POA: Insufficient documentation

## 2022-12-02 DIAGNOSIS — I4891 Unspecified atrial fibrillation: Secondary | ICD-10-CM | POA: Diagnosis not present

## 2022-12-02 DIAGNOSIS — R0602 Shortness of breath: Secondary | ICD-10-CM | POA: Diagnosis not present

## 2022-12-02 DIAGNOSIS — I5032 Chronic diastolic (congestive) heart failure: Secondary | ICD-10-CM | POA: Diagnosis not present

## 2022-12-02 LAB — CBC
HCT: 38.9 % (ref 36.0–46.0)
Hemoglobin: 12.2 g/dL (ref 12.0–15.0)
MCH: 28.4 pg (ref 26.0–34.0)
MCHC: 31.4 g/dL (ref 30.0–36.0)
MCV: 90.7 fL (ref 80.0–100.0)
Platelets: 218 10*3/uL (ref 150–400)
RBC: 4.29 MIL/uL (ref 3.87–5.11)
RDW: 14.8 % (ref 11.5–15.5)
WBC: 9.1 10*3/uL (ref 4.0–10.5)
nRBC: 0 % (ref 0.0–0.2)

## 2022-12-02 LAB — BASIC METABOLIC PANEL
Anion gap: 11 (ref 5–15)
BUN: 22 mg/dL (ref 8–23)
CO2: 24 mmol/L (ref 22–32)
Calcium: 8.8 mg/dL — ABNORMAL LOW (ref 8.9–10.3)
Chloride: 105 mmol/L (ref 98–111)
Creatinine, Ser: 1 mg/dL (ref 0.44–1.00)
GFR, Estimated: 54 mL/min — ABNORMAL LOW (ref >60)
Glucose, Bld: 94 mg/dL (ref 70–99)
Potassium: 3.8 mmol/L (ref 3.5–5.1)
Sodium: 140 mmol/L (ref 135–145)

## 2022-12-02 LAB — MAGNESIUM: Magnesium: 2.1 mg/dL (ref 1.7–2.4)

## 2022-12-02 LAB — TSH: TSH: 2.096 u[IU]/mL (ref 0.350–4.500)

## 2022-12-02 MED ORDER — METOPROLOL TARTRATE 50 MG PO TABS: 50.0000 mg | ORAL_TABLET | Freq: Two times a day (BID) | ORAL | 3 refills | Status: DC

## 2022-12-02 NOTE — Progress Notes (Signed)
Follow-up Outpatient Visit Date: 12/02/2022  Primary Care Provider: Perrin Maltese, MD [address] [place] [address]  Chief Complaint: Follow-up CAD, HFpEF, and atrial fibrillation  HPI:  Karen Cunningham is a 89 y.o. female with history of coronary artery disease, HFpEF, permanent atrial fibrillation, hypertension, hyperlipidemia, chronic kidney disease, hypothyroidism, and chronic lower extremity edema, who presents for follow-up of [place] artery disease, atrial fibrillation, and hypertension.  I last saw her in June, which time she reported stable exertional dyspnea.  We did not make any medication changes or pursue additional testing.  Her daughter reached out to [place] earlier this month, concerned about Karen Cunningham's elevated blood pressures.  Karen Cunningham reports that she has been feeling out of sorts over the last few weeks.  She had COVID-19 last month but did not notice spikes in her blood pressure nor shortness of breath and edema until about 2 to 3 weeks ago.  She has taken some extra Lasix with modest improvement.  Her blood pressures have been up and down.  She continues to feel more tired than usual and also gets out of breath more easily.  She has not had any chest pain or palpitations.  She has been compliant with her medications.  --------------------------------------------------------------------------------------------------  Past Medical History:  Diagnosis Date   (HFpEF) heart failure with preserved ejection fraction ([place])    a. 07/2016 Echo: EF 51%; b. 12/2016 Echo: EF 41%. Mod TR, mild PR; c. 09/2017 Echo [facility]): EF 62%, Sev BAE. Mod TR, mild PR; d. 03/2019 Echo([facility]): EF 57%, Gr3 DD. Sev dil LA, mild dil RA. Midly dil Asc Ao. Trace AI. Mild to mod TR, mild PR. RVSP 51mHg.   Anemia    Arthritis    Carotid arterial disease ([place]    a. 10/2013 Mod to Sev RICA dzs. Mild dzs in L bulb; b. [id number] Carotids: LICA [id number] RICA w/o stenosis.   CKD (chronic kidney disease), stage III  (HCC)    Coronary artery disease    a. Several neg MV between 2008 and 2010; b. 2010 s/p PCI/DES to the mLAD; c. 10/2013 MV: Fixed apical defect, likely breast attenuation, no ischemia; d. 09/2014 MV: No ischemia; e. 03/2018 MV ([facility]: no ischemia.   GERD (gastroesophageal reflux disease)    HLD (hyperlipidemia)    Hypertension    Hypothyroidism    Permanent atrial fibrillation ([place]    a. Dx ~ 2017. CHA2DS2VASc = 6-->coumadin.   Past Surgical History:  Procedure Laterality Date   ABDOMINAL HYSTERECTOMY     APPENDECTOMY     BACK SURGERY     CARDIAC CATHETERIZATION     x1 stent;[facility]   CORONARY STENT PLACEMENT     JOINT REPLACEMENT Left    hip   JOINT REPLACEMENT Bilateral    knees   NISSEN FUNDOPLICATION     THORACOTOMY     TOTAL HIP ARTHROPLASTY Right 03/09/2018   Procedure: TOTAL HIP ARTHROPLASTY;  Surgeon: HDereck Leep MD;  Location: [facility] ORS;  Service: Orthopedics;  Laterality: Right;    Current Meds  Medication Sig   acetaminophen (TYLENOL) 500 MG tablet Take 1 tablet (500 mg total) by mouth every 6 (six) hours as needed for moderate pain (pain).   atorvastatin (LIPITOR) 40 MG tablet TAKE 1 TABLET BY MOUTH AT BEDTIME   Cholecalciferol (VITAMIN D3) 1000 UNITS CAPS Take 1,000 Units by mouth daily.    diltiazem (DILT-XR) 240 MG 24 hr capsule Take 1 capsule by mouth once daily  Ferrous Sulfate (IRON) 325 (65 FE) MG TABS Take 325 mg by mouth daily.    furosemide (LASIX) 20 MG tablet Take 1 tablet by mouth once daily   levothyroxine (SYNTHROID, LEVOTHROID) 88 MCG tablet Take 88 mcg by mouth daily before breakfast.   losartan (COZAAR) 50 MG tablet Take 1/2 (one-half) tablet by mouth once daily   Melatonin 10 MG TABS Take 10 mg by mouth at bedtime.   metoprolol tartrate (LOPRESSOR) 100 MG tablet Take 1 tablet by mouth twice daily   omeprazole (PRILOSEC) 20 MG capsule Take 1 capsule by mouth once daily   warfarin (COUMADIN) 2 MG tablet Take 1 tablet by mouth in the evening     Allergies: Codeine, Nsaids, Ibuprofen, and Prednisone  Social History   Tobacco Use   Smoking status: Never   Smokeless tobacco: Never  Vaping Use   Vaping Use: Never used  Substance Use Topics   Alcohol use: Never   Drug use: Never    Family History  Problem Relation Age of Onset   Heart Problems Mother    Heart attack Mother    AAA (abdominal aortic aneurysm) Mother    Hypertension Mother    Heart Problems Father     Review of Systems: A 12-system review of systems was performed and was negative except as noted in the HPI.  --------------------------------------------------------------------------------------------------  Physical Exam: BP (!) 108/58   Pulse (!) 48   Ht '4\' 11"'$  (1.499 m)   Wt 136 lb (61.7 kg)   LMP  (LMP Unknown)   SpO2 97%   BMI 27.47 kg/m  Repeat BP: 108/58  General:  NAD. Neck: No JVD or HJR. Lungs: Faint crackles at left lung base.  Fair air movement. Heart: Bradycardic and regular with 2/6 systolic murmur. Abdomen: Soft, nontender, nondistended. Extremities: Trace pretibial edema.  EKG: Atrial fibrillation with junctional rhythm and poor R wave progression.  Compared to prior tracing from 03/19/2022, junctional rhythm with underlying atrial fibrillation is now present.  Lab Results  Component Value Date   WBC 11.2 (H) 10/03/2020   HGB 12.2 10/03/2020   HCT 38.2 10/03/2020   MCV 90 10/03/2020   PLT 319 10/03/2020    Lab Results  Component Value Date   NA 139 11/29/2020   K 3.9 11/29/2020   CL 102 11/29/2020   CO2 26 11/29/2020   BUN 23 11/29/2020   CREATININE 1.17 (H) 11/29/2020   GLUCOSE 107 (H) 11/29/2020   ALT 15 02/25/2018    No results found for: "CHOL", "HDL", "LDLCALC", "LDLDIRECT", "TRIG", "CHOLHDL"  --------------------------------------------------------------------------------------------------  ASSESSMENT AND PLAN: Atrial fibrillation with slow ventricular response: Ventricular rate has decreased today  with underlying atrial fibrillation still present.  It appears that junctional rhythm is driving her ventricular rate.  We will have metoprolol to 50 mg twice daily but continue current dose of diltiazem.  I will check labs today including a CBC, BMP, magnesium, and TSH.  Chronic HFpEF and shortness of breath: Karen Cunningham appears fairly euvolemic on exam today.  I wonder if recent COVID-19 infection and decreased ventricular rate are dry having her symptoms.  We will decrease metoprolol, as above.  Continue furosemide 20 mg daily.  I will check a chest x-ray today given recent COVID-19 infection, increased fatigue/dyspnea, and crackles at the left lung base.  May benefit from addition of SGLT2 inhibitor in the future.  Valvular heart disease: Echo last year showed stable mild-moderate aortic stenosis as well as moderate mitral and tricuspid regurgitation.  Continue  current dose of furosemide, as above.  Anticipate repeat echocardiogram in about a year, sooner if symptoms do not improve with aforementioned interventions.  Hypertension: Blood pressures have been quite labile at home but are actually low normal today in the setting of bradycardia.  As above, we will decrease metoprolol to tartrate to 50 mg twice daily but continue current doses of diltiazem and losartan.  I have asked Karen Cunningham to continue monitoring her heart rate and blood pressure at home.  Follow-up: Return to clinic in 1 month.  Nelva Bush, MD 12/02/2022 3:52 PM

## 2022-12-02 NOTE — Patient Instructions (Addendum)
Medication Instructions:  Your physician recommends the following medication changes.   DECREASE: Metoprolol to 50 mg by mouth twice a day   *If you need a refill on your cardiac medications before your next appointment, please call your pharmacy*   Lab Work: Your provider would like for you to have following labs drawn: (CBC, BMP, Mg, TSH).   Please go to the [facility] entrance and check in at the front desk.  You do not need an appointment.  They are open from 7am-6 pm.   If you have labs (blood work) drawn today and your tests are completely normal, you will receive your results only by: [place] (if you have MyChart) OR A paper copy in the mail If you have any lab test that is abnormal or we need to change your treatment, we will call you to review the results.   Testing/Procedures: Your provider has ordered a chest X-Ray for you. You can have this done at the [facility] medical mall. You do not need an appointment. Please go to the entrance of the [facility] and check in at the front desk.     Follow-Up: At [facility], you and your health needs are our priority.  As part of our continuing mission to provide you with exceptional heart care, we have created designated Provider Care Teams.  These Care Teams include your primary Cardiologist (physician) and Advanced Practice Providers (APPs -  Physician Assistants and Nurse Practitioners) who all work together to provide you with the care you need, when you need it.  We recommend signing up for the patient portal called "MyChart".  Sign up information is provided on this After Visit Summary.  MyChart is used to connect with patients for Virtual Visits (Telemedicine).  Patients are able to view lab/test results, encounter notes, upcoming appointments, etc.  Non-urgent messages can be sent to your provider as well.   To learn more about what you can do with MyChart, go to [email or web address].    Your next  appointment:   1 month(s)  Provider:   You may see Nelva Bush, MD or one of the following Advanced Practice Providers on your designated Care Team:   Murray Hodgkins, NP Christell Faith, PA-C Cadence Kathlen Mody, PA-C Gerrie Nordmann, NP

## 2022-12-03 DIAGNOSIS — R0989 Other specified symptoms and signs involving the circulatory and respiratory systems: Secondary | ICD-10-CM | POA: Insufficient documentation

## 2022-12-16 ENCOUNTER — Ambulatory Visit
Payer: Medicare Other | Attending: Internal Medicine | Admitting: Pharmacist Clinician (PhC)/ Clinical Pharmacy Specialist

## 2022-12-16 DIAGNOSIS — I4891 Unspecified atrial fibrillation: Secondary | ICD-10-CM | POA: Insufficient documentation

## 2022-12-16 DIAGNOSIS — Z5181 Encounter for therapeutic drug level monitoring: Secondary | ICD-10-CM | POA: Insufficient documentation

## 2022-12-16 LAB — POCT INR: INR: 2.2 (ref 2.0–3.0)

## 2022-12-16 NOTE — Patient Instructions (Signed)
Continue on same dosage of Warfarin 1 tablet (2 mg) every day, EXCEPT 0.5 TABLET ON WEDNESDAYS. Recheck INR in 4 weeks  Call [phone] with questions or concerns

## 2023-01-07 ENCOUNTER — Ambulatory Visit: Payer: Medicare Other | Attending: Internal Medicine | Admitting: Internal Medicine

## 2023-01-07 ENCOUNTER — Encounter: Payer: Self-pay | Admitting: Internal Medicine

## 2023-01-07 VITALS — BP 120/58 | HR 99 | Ht 60.0 in | Wt 133.4 lb

## 2023-01-07 DIAGNOSIS — I10 Essential (primary) hypertension: Secondary | ICD-10-CM

## 2023-01-07 DIAGNOSIS — I4821 Permanent atrial fibrillation: Secondary | ICD-10-CM

## 2023-01-07 DIAGNOSIS — M25572 Pain in left ankle and joints of left foot: Secondary | ICD-10-CM

## 2023-01-07 DIAGNOSIS — I38 Endocarditis, valve unspecified: Secondary | ICD-10-CM | POA: Diagnosis present

## 2023-01-07 DIAGNOSIS — I2510 Atherosclerotic heart disease of native coronary artery without angina pectoris: Secondary | ICD-10-CM

## 2023-01-07 DIAGNOSIS — I5032 Chronic diastolic (congestive) heart failure: Secondary | ICD-10-CM | POA: Diagnosis present

## 2023-01-07 NOTE — Patient Instructions (Signed)
Medication Instructions:  Your Physician recommend you continue on your current medication as directed.    *If you need a refill on your cardiac medications before your next appointment, please call your pharmacy*   Lab Work: None ordered   Testing/Procedures: None ordered    Follow-Up: At [facility], you and your health needs are our priority.  As part of our continuing mission to provide you with exceptional heart care, we have created designated Provider Care Teams.  These Care Teams include your primary Cardiologist (physician) and Advanced Practice Providers (APPs -  Physician Assistants and Nurse Practitioners) who all work together to provide you with the care you need, when you need it.  We recommend signing up for the patient portal called "MyChart".  Sign up information is provided on this After Visit Summary.  MyChart is used to connect with patients for Virtual Visits (Telemedicine).  Patients are able to view lab/test results, encounter notes, upcoming appointments, etc.  Non-urgent messages can be sent to your provider as well.   To learn more about what you can do with MyChart, go to [email or web address].    Your next appointment:   2 month(s)  Provider:   You may see Yvonne Kendall, MD or one of the following Advanced Practice Providers on your designated Care Team:   Nicolasa Ducking, NP Eula Listen, PA-C Cadence Fransico Michael, PA-C Charlsie Quest, NP

## 2023-01-07 NOTE — Progress Notes (Signed)
Follow-up Outpatient Visit Date: 01/07/2023  Primary Care Provider: Margaretann Loveless, MD [address] [place] [address]  Chief Complaint: Follow-up atrial fibrillation  HPI:  Ms. Karen Cunningham is a 89 y.o. female with history of coronary artery disease, HFpEF, permanent atrial fibrillation, hypertension, hyperlipidemia, chronic kidney disease, hypothyroidism, and chronic lower extremity edema, who presents for follow-up of coronary artery disease, atrial fibrillation, and hypertension.  I last saw her a month ago, at which time she reported feeling "out of sorts," having had COVID-19 a month earlier.  Over the 2 to 3 weeks preceding her last visit, she complained of blood pressure spikes, shortness of breath, and edema.  She was noted to be in atrial fibrillation with completing junctional rhythm and a heart rate of 48 bpm.  We agreed to decrease metoprolol to 50 mg twice daily.  Today, [name] reports that she is feeling better with more energy.  Her daughter noticed almost immediate improvement in her mother after deescalation of metoprolol.  She denies chest pain, shortness of breath, palpitations, and lightheadedness.  Mild ankle edema is similar to prior visits.  She began having pain in her left foot/ankle about a week ago.  She feels like it gives out on her at times, leading to a fall earlier today (she was able to catch herself before hitting the ground).  She denies injury to the ankle, thought it is tender just below that lateral malleolus.  She denies bleeding.  --------------------------------------------------------------------------------------------------  Past Medical History:  Diagnosis Date   (HFpEF) heart failure with preserved ejection fraction    a. 07/2016 Echo: EF 51%; b. 12/2016 Echo: EF 41%. Mod TR, mild PR; c. 09/2017 Echo [facility]): EF 62%, Sev BAE. Mod TR, mild PR; d. 03/2019 Echo([facility]): EF 57%, Gr3 DD. Sev dil LA, mild dil RA. Midly dil Asc Ao. Trace AI. Mild to mod TR,  mild PR. RVSP .   Anemia    Arthritis    Carotid arterial disease    a. 10/2013 Mod to Sev RICA dzs. Mild dzs in L bulb; b. 10/2013 CTA Carotids: LICA 50p, RICA w/o stenosis.   CKD (chronic kidney disease), stage III    Coronary artery disease    a. Several neg MV between 2008 and 2010; b. 2010 s/p PCI/DES to the mLAD; c. 10/2013 MV: Fixed apical defect, likely breast attenuation, no ischemia; d. 09/2014 MV: No ischemia; e. 03/2018 MV Wel[facility] Flakes): no ischemia.   GERD (gastroesophageal reflux disease)    HLD (hyperlipidemia)    Hypertension    Hypothyroidism    Permanent atrial fibrillation    a. Dx ~ 2017. CHA2DS2VASc = 6-->coumadin.   Past Surgical History:  Procedure Laterality Date   ABDOMINAL HYSTERECTOMY     APPENDECTOMY     BACK SURGERY     CARDIAC CATHETERIZATION     x1 stent;[facility]   CORONARY STENT PLACEMENT     JOINT REPLACEMENT Left    hip   JOINT REPLACEMENT Bilateral    knees   NISSEN FUNDOPLICATION     THORACOTOMY     TOTAL HIP ARTHROPLASTY Right 03/09/2018   Procedure: TOTAL HIP ARTHROPLASTY;  Surgeon: Donato Heinz, MD;  Location: [facility] ORS;  Service: Orthopedics;  Laterality: Right;    Current Meds  Medication Sig   acetaminophen (TYLENOL) 500 MG tablet Take 1 tablet (500 mg total) by mouth every 6 (six) hours as needed for moderate pain (pain).   atorvastatin (LIPITOR) 40 MG tablet TAKE 1 TABLET BY MOUTH AT  BEDTIME   Cholecalciferol (VITAMIN D3) 1000 UNITS CAPS Take 1,000 Units by mouth daily.    diltiazem (DILT-XR) 240 MG 24 hr capsule Take 1 capsule by mouth once daily   Ferrous Sulfate (IRON) 325 (65 FE) MG TABS Take 325 mg by mouth daily.    furosemide (LASIX) 20 MG tablet Take 1 tablet by mouth once daily   levothyroxine (SYNTHROID, LEVOTHROID) 88 MCG tablet Take 88 mcg by mouth daily before breakfast.   losartan (COZAAR) 50 MG tablet Take 1/2 (one-half) tablet by mouth once daily   Melatonin 10 MG TABS Take 10 mg by mouth at bedtime.   metoprolol tartrate  (LOPRESSOR) 50 MG tablet Take 1 tablet (50 mg total) by mouth 2 (two) times daily.   omeprazole (PRILOSEC) 20 MG capsule Take 1 capsule by mouth once daily   warfarin (COUMADIN) 2 MG tablet Take 1 tablet by mouth in the evening    Allergies: Codeine, Nsaids, Ibuprofen, and Prednisone  Social History   Tobacco Use   Smoking status: Never   Smokeless tobacco: Never  Vaping Use   Vaping Use: Never used  Substance Use Topics   Alcohol use: Never   Drug use: Never    Family History  Problem Relation Age of Onset   Heart Problems Mother    Heart attack Mother    AAA (abdominal aortic aneurysm) Mother    Hypertension Mother    Heart Problems Father     Review of Systems: A 12-system review of systems was performed and was negative except as noted in the HPI.  --------------------------------------------------------------------------------------------------  Physical Exam: BP (!) 120/58 (BP Location: Left Arm, Patient Position: Sitting, Cuff Size: Normal)   Pulse 99   Ht 5' (1.524 m)   Wt 133 lb 6 oz (60.5 kg)   LMP  (LMP Unknown)   SpO2 96%   BMI 26.05 kg/m   General:  NAD. Neck: No JVD or HJR. Lungs: Clear to auscultation bilaterally without wheezes or crackles. Heart: Irregularly irregular with 2/6 systolic murmur. Abdomen: Soft, nontender, nondistended. Extremities: Trace ankle edema.  No significant swelling or deformity of the left foot/ankle.  Mild left ankle tenderness noted.  EKG:  Atrial fibrillation.  Heart rate has increased from prior tracing on 12/02/2022.  Otherwise, no significant interval change.  Lab Results  Component Value Date   WBC 9.1 12/02/2022   HGB 12.2 12/02/2022   HCT 38.9 12/02/2022   MCV 90.7 12/02/2022   PLT 218 12/02/2022    Lab Results  Component Value Date   NA 140 12/02/2022   K 3.8 12/02/2022   CL 105 12/02/2022   CO2 24 12/02/2022   BUN 22 12/02/2022   CREATININE 1.00 12/02/2022   GLUCOSE 94 12/02/2022   ALT 15  02/25/2018    --------------------------------------------------------------------------------------------------  ASSESSMENT AND PLAN: Permanent atrial fibrillation: Ventricular rates higher following deescalation of metoprolol at our last visit, resulting in improved energy.  Though resting HR is at the upper Adrienne T[place]betta of our goal, we will defer increasing metoprolol or diltiazem today given significant bradycardia at our last visit.  Continue warfarin.  Chronic HFpEF and valvular heart disease: Volume status appears fairly stable with NYHA class II symptoms.  Continue furosemide 20 mg daily.  Hypertension: BP well-controlled.  Continue current doses of diltiazem, metoprolol, and losartan.  Hyperlipidemia and coronary artery disease: No angina reported.  Continue atorvastatin with ongoing management/follow-up per Dr. Welton Flakes.   Left ankle pain/weakness: No trauma reported, though description of pain and tenderness on  exam are most consistent with ligamentous injury.  I have recommended trial of APAP and topical treatment like Ben-Gay or Icy-Hot.  If symptoms don't improve over the next week, consultation with Dr. Welton Flakes or orthopedics should be considered.  Follow-up: Return to clinic in 2 months to reassess heart rate.  Yvonne Kendall, MD 01/07/2023 1:39 PM

## 2023-01-10 ENCOUNTER — Encounter: Payer: Self-pay | Admitting: Internal Medicine

## 2023-01-13 ENCOUNTER — Ambulatory Visit: Payer: Medicare Other | Attending: Internal Medicine

## 2023-01-13 DIAGNOSIS — Z5181 Encounter for therapeutic drug level monitoring: Secondary | ICD-10-CM | POA: Insufficient documentation

## 2023-01-13 DIAGNOSIS — I4891 Unspecified atrial fibrillation: Secondary | ICD-10-CM | POA: Diagnosis present

## 2023-01-13 LAB — POCT INR: INR: 2.2 (ref 2.0–3.0)

## 2023-01-13 NOTE — Patient Instructions (Signed)
Description   Continue on same dosage of Warfarin 1 tablet (2 mg) every day, EXCEPT 0.5 TABLET ON WEDNESDAYS. Recheck INR in 6 weeks  Call [phone] with questions or concerns

## 2023-02-08 ENCOUNTER — Other Ambulatory Visit: Payer: Self-pay | Admitting: Internal Medicine

## 2023-02-08 DIAGNOSIS — I10 Essential (primary) hypertension: Secondary | ICD-10-CM

## 2023-02-08 DIAGNOSIS — I4821 Permanent atrial fibrillation: Secondary | ICD-10-CM

## 2023-02-09 NOTE — Telephone Encounter (Signed)
Warfarin 2mg  refill Afib Last INR 01/13/23 Last OV 01/07/23

## 2023-02-09 NOTE — Telephone Encounter (Signed)
Refill Request.  

## 2023-02-12 ENCOUNTER — Ambulatory Visit: Payer: PRIVATE HEALTH INSURANCE | Admitting: Podiatry

## 2023-02-24 ENCOUNTER — Ambulatory Visit: Payer: Medicare Other | Attending: Internal Medicine

## 2023-02-24 DIAGNOSIS — I4891 Unspecified atrial fibrillation: Secondary | ICD-10-CM | POA: Diagnosis present

## 2023-02-24 DIAGNOSIS — Z5181 Encounter for therapeutic drug level monitoring: Secondary | ICD-10-CM | POA: Insufficient documentation

## 2023-02-24 LAB — POCT INR: INR: 2.2 (ref 2.0–3.0)

## 2023-02-24 NOTE — Patient Instructions (Signed)
Continue on same dosage of Warfarin 1 tablet (2 mg) every day, EXCEPT 0.5 TABLET ON WEDNESDAYS. Recheck INR in 6 weeks; EAT GREENS 1 TIME PER WEEK;  Call [phone] with questions or concerns

## 2023-03-12 ENCOUNTER — Encounter: Payer: Self-pay | Admitting: Internal Medicine

## 2023-03-12 ENCOUNTER — Ambulatory Visit: Payer: Medicare Other | Attending: Internal Medicine | Admitting: Internal Medicine

## 2023-03-12 VITALS — BP 120/70 | HR 63 | Ht 60.0 in | Wt 130.5 lb

## 2023-03-12 DIAGNOSIS — I4821 Permanent atrial fibrillation: Secondary | ICD-10-CM | POA: Diagnosis present

## 2023-03-12 DIAGNOSIS — H3530 Unspecified macular degeneration: Secondary | ICD-10-CM | POA: Insufficient documentation

## 2023-03-12 DIAGNOSIS — I38 Endocarditis, valve unspecified: Secondary | ICD-10-CM | POA: Diagnosis present

## 2023-03-12 DIAGNOSIS — R0981 Nasal congestion: Secondary | ICD-10-CM | POA: Diagnosis present

## 2023-03-12 DIAGNOSIS — E785 Hyperlipidemia, unspecified: Secondary | ICD-10-CM | POA: Diagnosis not present

## 2023-03-12 DIAGNOSIS — I5032 Chronic diastolic (congestive) heart failure: Secondary | ICD-10-CM | POA: Diagnosis present

## 2023-03-12 NOTE — Progress Notes (Signed)
Follow-up Outpatient Visit Date: 03/12/2023  Primary Care Provider: Margaretann Loveless, MD [address] [place] [address]  Chief Complaint: Follow-up atrial fibrillation  HPI:  Karen Cunningham is a 89 y.o. female with history of coronary artery disease, HFpEF, permanent atrial fibrillation, hypertension, hyperlipidemia, chronic kidney disease, hypothyroidism, and chronic lower extremity edema, who presents for follow-up of coronary artery disease, atrial fibrillation, and hypertension.  I last saw Karen Cunningham in April, at which time she reported feeling better with more energy following COVID-19 infection in February.  Karen Cunningham also seemed to be feeling better after we had decreased metoprolol in March due to EKG showing junctional rhythm with a heart rate of 48 bpm.  Heart rate was upper normal at our last visit, though given prior bradycardia, we elected to defer escalation of metoprolol or diltiazem.  Today, Karen Cunningham continues to feel fairly well with more energy after de-escalation of metoprolol earlier this year.  She denies significant shortness of breath as well as chest pain, palpitations, and lightheadedness.  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  She saw a retina specialist this morning who has recommended intraocular injections, which Karen Cunningham and her family wish to think about before proceeding with.  She remains on warfarin without bleeding.  Prior left ankle/foot pain has resolved.  She notes some intermittent nasal/sinus drainage and congestion, which seems to have become a little more pronounced over the last couple of weeks.  She denies fevers and chills.  --------------------------------------------------------------------------------------------------  Past Medical History:  Diagnosis Date   (HFpEF) heart failure with preserved ejection fraction (HCC)    a. 07/2016 Echo: EF 51%; b. 12/2016 Echo: EF 41%.  Mod TR, mild PR; c. 09/2017 Echo Welton Cunningham): EF 62%, Sev BAE. Mod TR, mild PR; d. 03/2019 Echo([facility]): EF 57%, Gr3 DD. Sev dil LA, mild dil RA. Midly dil Asc Ao. Trace AI. Mild to mod TR, mild PR. RVSP .   Anemia    Arthritis    Carotid arterial disease (HCC)    a. 10/2013 Mod to Sev RICA dzs. Mild dzs in L bulb; b. 10/2013 CTA Carotids: LICA 50p, RICA w/o stenosis.   CKD (chronic kidney disease), stage III (HCC)    Coronary artery disease    a. Several neg MV between 2008 and 2010; b. 2010 s/p PCI/DES to the mLAD; c. 10/2013 MV: Fixed apical defect, likely breast attenuation, no ischemia; d. 09/2014 MV: No ischemia; e. 03/2018 MV Welton Cunningham): no ischemia.   GERD (gastroesophageal reflux disease)    HLD (hyperlipidemia)    Hypertension    Hypothyroidism    Permanent atrial fibrillation (HCC)    a. Dx ~ 2017. CHA2DS2VASc = 6-->coumadin.   Past Surgical History:  Procedure Laterality Date   ABDOMINAL HYSTERECTOMY     APPENDECTOMY     BACK SURGERY     CARDIAC CATHETERIZATION     x1 stent;[facility]   CORONARY STENT PLACEMENT     JOINT REPLACEMENT Left    hip   JOINT REPLACEMENT Bilateral    knees   NISSEN FUNDOPLICATION     THORACOTOMY     TOTAL HIP ARTHROPLASTY Right 03/09/2018   Procedure: TOTAL HIP ARTHROPLASTY;  Surgeon: Donato Heinz, MD;  Location: [facility] ORS;  Service: Orthopedics;  Laterality: Right;    Current Meds  Medication Sig   acetaminophen (TYLENOL) 500 MG tablet Take 1 tablet (500 mg total) by mouth every 6 (six)  hours as needed for moderate pain (pain).   atorvastatin (LIPITOR) 40 MG tablet TAKE 1 TABLET BY MOUTH AT BEDTIME   Cholecalciferol (VITAMIN D3) 1000 UNITS CAPS Take 1,000 Units by mouth daily.    diltiazem (DILT-XR) 240 MG 24 hr capsule Take 1 capsule by mouth once daily   Ferrous Sulfate (IRON) 325 (65 FE) MG TABS Take 325 mg by mouth daily.    furosemide (LASIX) 20 MG tablet Take 1 tablet by mouth once daily   levothyroxine (SYNTHROID, LEVOTHROID) 88 MCG tablet Take  88 mcg by mouth daily before breakfast.   losartan (COZAAR) 50 MG tablet Take 1/2 (one-half) tablet by mouth once daily   Melatonin 10 MG TABS Take 10 mg by mouth at bedtime.   metoprolol tartrate (LOPRESSOR) 50 MG tablet Take 1 tablet (50 mg total) by mouth 2 (two) times daily.   omeprazole (PRILOSEC) 20 MG capsule Take 1 capsule by mouth once daily   warfarin (COUMADIN) 2 MG tablet Take 1 tablet by mouth in the evening or as directed by Anticoagulation Clinic.    Allergies: Codeine, Nsaids, Ibuprofen, and Prednisone  Social History   Tobacco Use   Smoking status: Never   Smokeless tobacco: Never  Vaping Use   Vaping Use: Never used  Substance Use Topics   Alcohol use: Never   Drug use: Never    Family History  Problem Relation Age of Onset   Heart Problems Mother    Heart attack Mother    AAA (abdominal aortic aneurysm) Mother    Hypertension Mother    Heart Problems Father     Review of Systems: A 12-system review of systems was performed and was negative except as noted in the HPI.  --------------------------------------------------------------------------------------------------  Physical Exam: BP 120/70 (BP Location: Left Arm, Patient Position: Sitting, Cuff Size: Normal)   Pulse 63   Ht 5' (1.524 m)   Wt 130 lb 8 oz (59.2 kg)   LMP  (LMP Unknown)   SpO2 98%   BMI 25.49 kg/m   General:  NAD. Neck: No JVD or HJR. Lungs: Faint crackles at left lung base.  Good air movement. Heart: Irregularly irregular rhythm with 2/6 systolic murmur. Abdomen: Soft, nontender, nondistended. Extremities: Varicose veins noted without significant edema.  EKG: Atrial fibrillation.  Ventricular rate decreased from 99 to 63 bpm since 01/07/2023.  Otherwise, no significant interval change.  Lab Results  Component Value Date   WBC 9.1 12/02/2022   HGB 12.2 12/02/2022   HCT 38.9 12/02/2022   MCV 90.7 12/02/2022   PLT 218 12/02/2022    Lab Results  Component Value Date   NA  140 12/02/2022   K 3.8 12/02/2022   CL 105 12/02/2022   CO2 24 12/02/2022   BUN 22 12/02/2022   CREATININE 1.00 12/02/2022   GLUCOSE 94 12/02/2022   ALT 15 02/25/2018    --------------------------------------------------------------------------------------------------  ASSESSMENT AND PLAN: Permanent atrial fibrillation: Ventricular rate control improved since April.  Overall, Karen Cunningham still feels better since we decreased her metoprolol in March.  Continue current doses of diltiazem and metoprolol as well as long-term anticoagulation with warfarin.  Her daughter will check with insurance again to see if a NOAC is more affordable now, as she previously did well with Xarelto until it became cost prohibitive.  Chronic HFpEF and valvular heart disease: Karen Cunningham appears euvolemic on exam.  She has some faint crackles at the left lung base but otherwise no signs or symptoms of heart failure.  Chest  radiograph in March showed possible chronic interstitial scarring that was also seen at the left lung base on prior chest CT in 2020.  We discussed repeating a CT of the chest but have agreed to defer this given minimal symptoms.  Continue furosemide 20 mg daily as well as losartan and metoprolol.  Consider repeating echo in another year for follow-up of aortic stenosis and mitral/tricuspid regurgitation (sooner if symptoms worsen).  Hyperlipidemia: Continue ongoing management per Dr. Welton Cunningham.  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  If this will help preserve her vision, I think it would be reasonable to move forward with this.  However, I encouraged her to speak with her ophthalmology team about undergoing this therapy while on warfarin.  If warfarin needs to be held, this will need to be coordinated with our anticoagulation team.  Sinus congestion: This has  been an intermittent longstanding issue.  I encouraged Karen Cunningham to try fluticasone nasal spray to see if this offers any relief.  Follow-up: Return to clinic in 6 months.  Yvonne Kendall, MD 03/12/2023 10:36 AM

## 2023-03-12 NOTE — Patient Instructions (Signed)
Medication Instructions:  Your physician recommends that you continue on your current medications as directed. Please refer to the Current Medication list given to you today.  Dr. Okey Dupre recommends purchasing Flonase over the counter   Dr. Okey Dupre recommends you talk with eye doctor to determine if you are required to stop warfarin prior to eye procedure. If so, please have the fax clearance to our office [phone].  *If you need a refill on your cardiac medications before your next appointment, please call your pharmacy*   Lab Work: None ordered today   Testing/Procedures: None ordered today   Follow-Up: At [facility], you and your health needs are our priority.  As part of our continuing mission to provide you with exceptional heart care, we have created designated Provider Care Teams.  These Care Teams include your primary Cardiologist (physician) and Advanced Practice Providers (APPs -  Physician Assistants and Nurse Practitioners) who all work together to provide you with the care you need, when you need it.  We recommend signing up for the patient portal called "MyChart".  Sign up information is provided on this After Visit Summary.  MyChart is used to connect with patients for Virtual Visits (Telemedicine).  Patients are able to view lab/test results, encounter notes, upcoming appointments, etc.  Non-urgent messages can be sent to your provider as well.   To learn more about what you can do with MyChart, go to [email or web address].    Your next appointment:   6 month(s)  Provider:   You may see Yvonne Kendall, MD or one of the following Advanced Practice Providers on your designated Care Team:   Nicolasa Ducking, NP Eula Listen, PA-C Cadence Fransico Michael, PA-C Charlsie Quest, NP

## 2023-03-16 NOTE — Addendum Note (Signed)
Addended by: NEWCOMER MCCLAIN, Myrtle Barnhard L on: 03/16/2023 03:48 PM   Modules accepted: Orders  

## 2023-03-30 ENCOUNTER — Ambulatory Visit (INDEPENDENT_AMBULATORY_CARE_PROVIDER_SITE_OTHER): Payer: Medicare Other | Admitting: Podiatry

## 2023-03-30 DIAGNOSIS — B351 Tinea unguium: Secondary | ICD-10-CM

## 2023-03-30 DIAGNOSIS — M79609 Pain in unspecified limb: Secondary | ICD-10-CM

## 2023-03-31 NOTE — Progress Notes (Signed)
   SUBJECTIVE Patient presents to office today complaining of elongated, thickened nails that cause pain while ambulating in shoes.  Patient is unable to trim their own nails. Patient is here for further evaluation and treatment.  Past Medical History:  Diagnosis Date   (HFpEF) heart failure with preserved ejection fraction (HCC)    a. 07/2016 Echo: EF 51%; b. 12/2016 Echo: EF 41%. Mod TR, mild PR; c. 09/2017 Echo ([facility]): EF 62%, Sev BAE. Mod TR, mild PR; d. 03/2019 Echo([facility]): EF 57%, Gr3 DD. Sev dil LA, mild dil RA. Midly dil Asc Ao. Trace AI. Mild to mod TR, mild PR. RVSP 39mmHg.   Anemia    Arthritis    Carotid arterial disease (HCC)    a. 10/2013 Mod to Sev RICA dzs. Mild dzs in L bulb; b. 10/2013 CTA Carotids: LICA 50p, RICA w/o stenosis.   CKD (chronic kidney disease), stage III (HCC)    Coronary artery disease    a. Several neg MV between 2008 and 2010; b. 2010 s/p PCI/DES to the mLAD; c. 10/2013 MV: Fixed apical defect, likely breast attenuation, no ischemia; d. 09/2014 MV: No ischemia; e. 03/2018 MV ([facility]): no ischemia.   GERD (gastroesophageal reflux disease)    HLD (hyperlipidemia)    Hypertension    Hypothyroidism    Permanent atrial fibrillation (HCC)    a. Dx ~ 2017. CHA2DS2VASc = 6-->coumadin.    OBJECTIVE General Patient is awake, alert, and oriented x 3 and in no acute distress. Derm Skin is dry and supple bilateral. Negative open lesions or macerations. Remaining integument unremarkable. Nails are tender, long, thickened and dystrophic with subungual debris, consistent with onychomycosis, 1-5 bilateral. No signs of infection noted. Vasc  DP and PT pedal pulses palpable bilaterally. Temperature gradient within normal limits.  Neuro Epicritic and protective threshold sensation grossly intact bilaterally.  Musculoskeletal Exam No symptomatic pedal deformities noted bilateral. Muscular strength within normal limits.  ASSESSMENT 1.  Pain due to onychomycosis of toenails  both  PLAN OF CARE 1. Patient evaluated today.  2. Instructed to maintain good pedal hygiene and foot care.  3. Mechanical debridement of nails 1-5 bilaterally performed using a nail nipper. Filed with dremel without incident.  4. Return to clinic in 3 mos.   *goes by Miss Nalda  Geovanni Rahming M. Shaheed Schmuck, DPM [facility]  Dr. Hulon Ferron M. Doyel Mulkern, DPM    [address]                                     [place], [place] [address]                Office [phone]  Fax [phone]   

## 2023-04-05 ENCOUNTER — Encounter: Payer: Self-pay | Admitting: Internal Medicine

## 2023-04-05 ENCOUNTER — Ambulatory Visit (INDEPENDENT_AMBULATORY_CARE_PROVIDER_SITE_OTHER): Payer: Medicare Other | Admitting: Internal Medicine

## 2023-04-05 VITALS — BP 138/84 | HR 76 | Ht 60.0 in | Wt 134.0 lb

## 2023-04-05 DIAGNOSIS — E782 Mixed hyperlipidemia: Secondary | ICD-10-CM

## 2023-04-05 DIAGNOSIS — I4821 Permanent atrial fibrillation: Secondary | ICD-10-CM | POA: Diagnosis not present

## 2023-04-05 DIAGNOSIS — I340 Nonrheumatic mitral (valve) insufficiency: Secondary | ICD-10-CM

## 2023-04-05 DIAGNOSIS — I2510 Atherosclerotic heart disease of native coronary artery without angina pectoris: Secondary | ICD-10-CM

## 2023-04-05 DIAGNOSIS — I10 Essential (primary) hypertension: Secondary | ICD-10-CM | POA: Diagnosis not present

## 2023-04-05 NOTE — Progress Notes (Signed)
Established Patient Office Visit  Subjective:  Patient ID: Karen Cunningham, female    DOB: 06-16-1931  Age: 89 y.o. MRN: [id number]  Chief Complaint  Patient presents with   Follow-up    6 month follow up    Patient comes in for her follow-up accompanied by her family member.  She is in good spirits and her hearing aid is working today.  She recently celebrated her 89th birthday party.  Generally feels well and has no new complaints.  Occasionally gets discomfort from her atrial fibrillation.  But no chest pain or shortness of breath. 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  Still lives independently.  Able to ambulate around the house with her walking cane..    No other concerns at this time.   Past Medical History:  Diagnosis Date   (HFpEF) heart failure with preserved ejection fraction (HCC)    a. 07/2016 Echo: EF 51%; b. 12/2016 Echo: EF 41%. Mod TR, mild PR; c. 09/2017 Echo Wel[facility] Flakes): EF 62%, Sev BAE. Mod TR, mild PR; d. 03/2019 Echo(Karlin Heilman): EF 57%, Gr3 DD. Sev dil LA, mild dil RA. Midly dil Asc Ao. Trace AI. Mild to mod TR, mild PR. RVSP .   Anemia    Arthritis    Carotid arterial disease (HCC)    a. 10/2013 Mod to Sev RICA dzs. Mild dzs in L bulb; b. 10/2013 CTA Carotids: LICA 50p, RICA w/o stenosis.   CKD (chronic kidney disease), stage III (HCC)    Coronary artery disease    a. Several neg MV between 2008 and 2010; b. 2010 s/p PCI/DES to the mLAD; c. 10/2013 MV: Fixed apical defect, likely breast attenuation, no ischemia; d. 09/2014 MV: No ischemia; e. 03/2018 MV Welton Flakes): no ischemia.   GERD (gastroesophageal reflux disease)    HLD (hyperlipidemia)    Hypertension    Hypothyroidism    Permanent atrial fibrillation (HCC)    a. Dx ~ 2017. CHA2DS2VASc = 6-->coumadin.    Past Surgical History:  Procedure Laterality Date   ABDOMINAL HYSTERECTOMY     APPENDECTOMY     BACK SURGERY     CARDIAC CATHETERIZATION     x1 stent;[facility]    CORONARY STENT PLACEMENT     JOINT REPLACEMENT Left    hip   JOINT REPLACEMENT Bilateral    knees   NISSEN FUNDOPLICATION     THORACOTOMY     TOTAL HIP ARTHROPLASTY Right 03/09/2018   Procedure: TOTAL HIP ARTHROPLASTY;  Surgeon: Donato Heinz, MD;  Location: [facility] ORS;  Service: Orthopedics;  Laterality: Right;    Social History   Socioeconomic History   Marital status: Widowed    Spouse name: Not on file   Number of children: Not on file   Years of education: Not on file   Highest education level: Not on file  Occupational History   Not on file  Tobacco Use   Smoking status: Never   Smokeless tobacco: Never  Vaping Use   Vaping status: Never Used  Substance and Sexual Activity   Alcohol use: Never   Drug use: Never   Sexual activity: Not on file  Other Topics Concern   Not on file  Social History Narrative   Not on file   Social Determinants of Health   Financial Resource Strain: Not on file  Food Insecurity: Not on file  Transportation Needs: Not on file  Physical Activity: Not on file  Stress: Not on file  Social Connections: Not on file  Intimate Partner Violence: Not on file    Family History  Problem Relation Age of Onset   Heart Problems Mother    Heart attack Mother    AAA (abdominal aortic aneurysm) Mother    Hypertension Mother    Heart Problems Father     Allergies  Allergen Reactions   Codeine Nausea And Vomiting   Nsaids     Dr instructed to avoid due to kidney issues    Ibuprofen Other (See Comments)    Dr instructed to avoid due to kidney issues    Prednisone Other (See Comments)    ELEVATED BLOOD PRESSURE.    Review of Systems  Constitutional: Negative.  Negative for chills, fever, malaise/fatigue and weight loss.  HENT:  Positive for hearing loss.   Eyes:  Positive for blurred vision.  Respiratory: Negative.  Negative for cough and shortness of breath.   Cardiovascular: Negative.  Negative for chest pain, palpitations and leg  swelling.  Gastrointestinal: Negative.  Negative for abdominal pain, constipation, diarrhea, heartburn, nausea and vomiting.  Genitourinary: Negative.  Negative for dysuria and flank pain.  Musculoskeletal: Negative.  Negative for joint pain and myalgias.  Skin: Negative.   Neurological: Negative.  Negative for dizziness and headaches.  Endo/Heme/Allergies: Negative.   Psychiatric/Behavioral: Negative.  Negative for depression and suicidal ideas. The patient is not nervous/anxious.        Objective:   BP 138/84   Pulse 76   Ht 5' (1.524 m)   Wt 134 lb (60.8 kg)   LMP  (LMP Unknown)   SpO2 95%   BMI 26.17 kg/m   Vitals:   04/05/23 0952  BP: 138/84  Pulse: 76  Height: 5' (1.524 m)  Weight: 134 lb (60.8 kg)  SpO2: 95%  BMI (Calculated): 26.17    Physical Exam Vitals and nursing note reviewed.  Constitutional:      Appearance: Normal appearance.  HENT:     Head: Normocephalic and atraumatic.     Nose: Nose normal.     Mouth/Throat:     Mouth: Mucous membranes are moist.     Pharynx: Oropharynx is clear.  Eyes:     Conjunctiva/sclera: Conjunctivae normal.     Pupils: Pupils are equal, round, and reactive to light.  Cardiovascular:     Rate and Rhythm: Rhythm irregular.     Heart sounds: Murmur heard.  Pulmonary:     Effort: Pulmonary effort is normal.     Breath sounds: Normal breath sounds. No wheezing.  Abdominal:     General: Bowel sounds are normal.     Palpations: Abdomen is soft.     Tenderness: There is no abdominal tenderness. There is no right CVA tenderness or left CVA tenderness.  Musculoskeletal:        General: Normal range of motion.     Cervical back: Normal range of motion.     Right lower leg: No edema.     Left lower leg: No edema.  Skin:    General: Skin is warm and dry.  Neurological:     General: No focal deficit present.     Mental Status: She is alert and oriented to person, place, and time.  Psychiatric:        Mood and Affect:  Mood normal.        Behavior: Behavior normal.      No results found for any visits on 04/05/23.  Recent Results (from the past 2160 hour(s))  POCT INR     Status: None   Collection Time: 01/13/23  4:11 PM  Result Value Ref Range   INR 2.2 2.0 - 3.0   POC INR    POCT INR     Status: None   Collection Time: 02/24/23  4:05 PM  Result Value Ref Range   INR 2.2 2.0 - 3.0   POC INR        Assessment & Plan:  Patient will get fasting blood work.  Meanwhile we will continue all the medications prescribed by her cardiologist. Problem List Items Addressed This Visit     Permanent atrial fibrillation (HCC)   Coronary artery disease involving native coronary artery of native heart without angina pectoris   Essential hypertension, benign - Primary   Relevant Orders   CMP14+EGFR   Mixed hyperlipidemia   Relevant Orders   Lipid Panel w/o Chol/HDL Ratio   Moderate mitral regurgitation    Return in about 6 months (around 10/06/2023).   Total time spent: 30 minutes  Margaretann Loveless, MD  04/05/2023   This document may have been prepared by [facility] Voice Recognition software and as such may include unintentional dictation errors.

## 2023-04-06 LAB — CMP14+EGFR
ALT: 14 IU/L (ref 0–32)
AST: 21 IU/L (ref 0–40)
Albumin: 4.2 g/dL (ref 3.6–4.6)
Alkaline Phosphatase: 136 IU/L — ABNORMAL HIGH (ref 44–121)
BUN/Creatinine Ratio: 16 (ref 12–28)
BUN: 14 mg/dL (ref 10–36)
Bilirubin Total: 0.9 mg/dL (ref 0.0–1.2)
CO2: 24 mmol/L (ref 20–29)
Calcium: 9.3 mg/dL (ref 8.7–10.3)
Chloride: 104 mmol/L (ref 96–106)
Creatinine, Ser: 0.89 mg/dL (ref 0.57–1.00)
Globulin, Total: 2.8 g/dL (ref 1.5–4.5)
Glucose: 96 mg/dL (ref 70–99)
Potassium: 4.4 mmol/L (ref 3.5–5.2)
Sodium: 142 mmol/L (ref 134–144)
Total Protein: 7 g/dL (ref 6.0–8.5)
eGFR: 62 mL/min/{1.73_m2} (ref >59)

## 2023-04-06 LAB — LIPID PANEL W/O CHOL/HDL RATIO
Cholesterol, Total: 131 mg/dL (ref 100–199)
HDL: 58 mg/dL (ref >39)
LDL Chol Calc (NIH): 58 mg/dL (ref 0–99)
Triglycerides: 77 mg/dL (ref 0–149)
VLDL Cholesterol Cal: 15 mg/dL (ref 5–40)

## 2023-04-06 NOTE — Progress Notes (Signed)
Spoke with patient who verified DOB and verbalized understanding.

## 2023-04-07 ENCOUNTER — Ambulatory Visit: Payer: Medicare Other | Attending: Internal Medicine

## 2023-04-07 DIAGNOSIS — Z5181 Encounter for therapeutic drug level monitoring: Secondary | ICD-10-CM | POA: Diagnosis not present

## 2023-04-07 DIAGNOSIS — I4891 Unspecified atrial fibrillation: Secondary | ICD-10-CM | POA: Insufficient documentation

## 2023-04-07 LAB — POCT INR: INR: 2.1 (ref 2.0–3.0)

## 2023-04-07 NOTE — Patient Instructions (Signed)
Continue on same dosage of Warfarin 1 tablet (2 mg) every day, EXCEPT 0.5 TABLET ON WEDNESDAYS. Recheck INR in 6 weeks; EAT GREENS 1 TIME PER WEEK;  Call [phone] with questions or concerns

## 2023-05-06 ENCOUNTER — Telehealth: Payer: Self-pay | Admitting: Internal Medicine

## 2023-05-06 NOTE — Telephone Encounter (Signed)
Patient's daughter is requesting to speak with Casimiro Needle in the [facility]in [facility].

## 2023-05-06 NOTE — Telephone Encounter (Signed)
Returned call to [facility] the pt's daughter in law and she states she needs the pt appointment changed due to other obligations. Was able to reschedule appt per request. She was grateful for the assistance and advised to cal back if anything further is needed.

## 2023-05-07 ENCOUNTER — Other Ambulatory Visit: Payer: Self-pay | Admitting: Internal Medicine

## 2023-05-07 DIAGNOSIS — I10 Essential (primary) hypertension: Secondary | ICD-10-CM

## 2023-05-07 DIAGNOSIS — I4821 Permanent atrial fibrillation: Secondary | ICD-10-CM

## 2023-05-07 NOTE — Telephone Encounter (Signed)
WARFARIN 2MG  refill Afib Last INR 04/07/23 Last OV 03/12/23

## 2023-05-12 ENCOUNTER — Telehealth: Payer: Self-pay | Admitting: Internal Medicine

## 2023-05-12 ENCOUNTER — Ambulatory Visit: Payer: Medicare Other | Attending: Internal Medicine

## 2023-05-12 DIAGNOSIS — Z5181 Encounter for therapeutic drug level monitoring: Secondary | ICD-10-CM

## 2023-05-12 DIAGNOSIS — I4891 Unspecified atrial fibrillation: Secondary | ICD-10-CM | POA: Diagnosis not present

## 2023-05-12 LAB — POCT INR: INR: 1.7 — AB (ref 2.0–3.0)

## 2023-05-12 NOTE — Patient Instructions (Signed)
Take 1.5 tablets tonight only then Continue on same dosage of Warfarin 1 tablet (2 mg) every day, EXCEPT 0.5 TABLET ON WEDNESDAYS. Recheck INR in 3 weeks; EAT GREENS 1 TIME PER WEEK;  Call [phone] with questions or concerns

## 2023-05-12 NOTE — Telephone Encounter (Signed)
Pt daughter called in stating they were asked today at coumadin check if pt had any changes to her regimen and she forgot to mention that pt is taking dulcolax stool softener.

## 2023-05-12 NOTE — Telephone Encounter (Signed)
I spoke to the daughter in law and answered her questions.  She verbalized understanding

## 2023-06-02 ENCOUNTER — Ambulatory Visit: Payer: Medicare Other | Attending: Internal Medicine

## 2023-06-02 DIAGNOSIS — I4891 Unspecified atrial fibrillation: Secondary | ICD-10-CM | POA: Diagnosis not present

## 2023-06-02 DIAGNOSIS — Z5181 Encounter for therapeutic drug level monitoring: Secondary | ICD-10-CM

## 2023-06-02 LAB — POCT INR: INR: 1.6 — AB (ref 2.0–3.0)

## 2023-06-02 NOTE — Patient Instructions (Signed)
INCREASE TO 1 TABLET DAILY. Recheck INR in 3 weeks; EAT GREENS ON Sunday and Wednesday  Call [phone] with questions or concerns

## 2023-06-23 ENCOUNTER — Ambulatory Visit: Payer: Medicare Other | Attending: Internal Medicine

## 2023-06-23 DIAGNOSIS — Z5181 Encounter for therapeutic drug level monitoring: Secondary | ICD-10-CM

## 2023-06-23 DIAGNOSIS — I4891 Unspecified atrial fibrillation: Secondary | ICD-10-CM

## 2023-06-23 LAB — POCT INR: INR: 2.1 (ref 2.0–3.0)

## 2023-06-23 NOTE — Patient Instructions (Signed)
Continue 1 TABLET DAILY. Recheck INR in 4 weeks; EAT GREENS ON Sunday and Wednesday  Call [phone] with questions or concerns

## 2023-07-21 ENCOUNTER — Ambulatory Visit: Payer: Medicare Other | Attending: Internal Medicine

## 2023-07-21 DIAGNOSIS — Z5181 Encounter for therapeutic drug level monitoring: Secondary | ICD-10-CM | POA: Diagnosis present

## 2023-07-21 DIAGNOSIS — I4891 Unspecified atrial fibrillation: Secondary | ICD-10-CM

## 2023-07-21 LAB — POCT INR: INR: 2.6 (ref 2.0–3.0)

## 2023-07-21 NOTE — Patient Instructions (Signed)
Continue 1 TABLET DAILY. Recheck INR in 7 weeks;   Call [phone] with questions or concerns

## 2023-08-04 ENCOUNTER — Encounter: Payer: Self-pay | Admitting: Internal Medicine

## 2023-08-04 ENCOUNTER — Other Ambulatory Visit: Payer: Self-pay | Admitting: Internal Medicine

## 2023-08-06 ENCOUNTER — Telehealth: Payer: Self-pay | Admitting: Internal Medicine

## 2023-08-06 NOTE — Telephone Encounter (Signed)
Returned the call to the daughter, per the dpr. Appointment moved from 12/18 to January 6th.

## 2023-08-06 NOTE — Telephone Encounter (Signed)
Patient's daughter is calling requesting call back to discuss if her appt on 12/18 needs to be changed. She states that her coumadin appt had been moved and needed to know if the same will be for her appt with Dr. Okey Dupre, as well. Please advise.

## 2023-08-10 ENCOUNTER — Ambulatory Visit: Payer: Medicare Other | Admitting: Podiatry

## 2023-08-22 ENCOUNTER — Other Ambulatory Visit: Payer: Self-pay | Admitting: Internal Medicine

## 2023-08-25 ENCOUNTER — Ambulatory Visit: Payer: Medicare Other | Attending: Internal Medicine

## 2023-08-25 DIAGNOSIS — I4891 Unspecified atrial fibrillation: Secondary | ICD-10-CM | POA: Insufficient documentation

## 2023-08-25 DIAGNOSIS — Z5181 Encounter for therapeutic drug level monitoring: Secondary | ICD-10-CM | POA: Insufficient documentation

## 2023-08-25 LAB — POCT INR: INR: 2.4 (ref 2.0–3.0)

## 2023-08-25 NOTE — Patient Instructions (Signed)
Continue 1 TABLET DAILY. Recheck INR in 7 weeks;   Call [phone] with questions or concerns

## 2023-08-31 ENCOUNTER — Encounter: Payer: Self-pay | Admitting: Podiatry

## 2023-08-31 ENCOUNTER — Ambulatory Visit (INDEPENDENT_AMBULATORY_CARE_PROVIDER_SITE_OTHER): Payer: Medicare Other | Admitting: Podiatry

## 2023-08-31 DIAGNOSIS — M79609 Pain in unspecified limb: Secondary | ICD-10-CM

## 2023-08-31 DIAGNOSIS — B351 Tinea unguium: Secondary | ICD-10-CM | POA: Diagnosis not present

## 2023-08-31 NOTE — Progress Notes (Signed)
   SUBJECTIVE Patient presents to office today complaining of elongated, thickened nails that cause pain while ambulating in shoes.  Patient is unable to trim their own nails. Patient is here for further evaluation and treatment.  Past Medical History:  Diagnosis Date   (HFpEF) heart failure with preserved ejection fraction ([place])    a. 07/2016 Echo: EF 51%; b. 12/2016 Echo: EF 41%. Mod TR, mild PR; c. 09/2017 Echo [facility]): EF 62%, Sev BAE. Mod TR, mild PR; d. 03/2019 Echo([facility]): EF 57%, Gr3 DD. Sev dil LA, mild dil RA. Midly dil Asc Ao. Trace AI. Mild to mod TR, mild PR. RVSP 48mHg.   Anemia    Arthritis    Carotid arterial disease ([place]    a. 10/2013 Mod to Sev RICA dzs. Mild dzs in L bulb; b. [id number] Carotids: LICA [id number] RICA w/o stenosis.   CKD (chronic kidney disease), stage III (HCC)    Coronary artery disease    a. Several neg MV between 2008 and 2010; b. 2010 s/p PCI/DES to the mLAD; c. 10/2013 MV: Fixed apical defect, likely breast attenuation, no ischemia; d. 09/2014 MV: No ischemia; e. 03/2018 MV ([facility]: no ischemia.   GERD (gastroesophageal reflux disease)    HLD (hyperlipidemia)    Hypertension    Hypothyroidism    Permanent atrial fibrillation ([place]    a. Dx ~ 2017. CHA2DS2VASc = 6-->coumadin.    OBJECTIVE General Patient is awake, alert, and oriented x 3 and in no acute distress. Derm Skin is dry and supple bilateral. Negative open lesions or macerations. Remaining integument unremarkable. Nails are tender, long, thickened and dystrophic with subungual debris, consistent with onychomycosis, 1-5 bilateral. No signs of infection noted. Vasc  DP and PT pedal pulses palpable bilaterally. Temperature gradient within normal limits.  Neuro Epicritic and protective threshold sensation grossly intact bilaterally.  Musculoskeletal Exam No symptomatic pedal deformities noted bilateral. Muscular strength within normal limits.  ASSESSMENT 1.  Pain due to onychomycosis of toenails  both  PLAN OF CARE 1. Patient evaluated today.  2. Instructed to maintain good pedal hygiene and foot care.  3. Mechanical debridement of nails 1-5 bilaterally performed using a nail nipper. Filed with dremel without incident.  4. Return to clinic in 3 mos.   *goes by Miss CGeorgie Chard DPM [facility]  Dr. BEdrick Kins DPM    [address] [place] [address]               Office [phone] Fax [phone]

## 2023-09-06 ENCOUNTER — Encounter: Payer: Self-pay | Admitting: Internal Medicine

## 2023-09-06 ENCOUNTER — Ambulatory Visit (INDEPENDENT_AMBULATORY_CARE_PROVIDER_SITE_OTHER): Payer: Medicare Other | Admitting: Internal Medicine

## 2023-09-06 VITALS — BP 120/60 | HR 65 | Ht 61.0 in | Wt 133.8 lb

## 2023-09-06 DIAGNOSIS — E079 Disorder of thyroid, unspecified: Secondary | ICD-10-CM | POA: Diagnosis not present

## 2023-09-06 DIAGNOSIS — I10 Essential (primary) hypertension: Secondary | ICD-10-CM | POA: Diagnosis not present

## 2023-09-06 DIAGNOSIS — I2510 Atherosclerotic heart disease of native coronary artery without angina pectoris: Secondary | ICD-10-CM | POA: Diagnosis not present

## 2023-09-06 DIAGNOSIS — E039 Hypothyroidism, unspecified: Secondary | ICD-10-CM | POA: Diagnosis not present

## 2023-09-06 DIAGNOSIS — J0100 Acute maxillary sinusitis, unspecified: Secondary | ICD-10-CM | POA: Insufficient documentation

## 2023-09-06 DIAGNOSIS — K219 Gastro-esophageal reflux disease without esophagitis: Secondary | ICD-10-CM | POA: Insufficient documentation

## 2023-09-06 MED ORDER — OMEPRAZOLE 20 MG PO CPDR: 20.0000 mg | DELAYED_RELEASE_CAPSULE | Freq: Every day | ORAL | 3 refills | Status: DC

## 2023-09-06 MED ORDER — AMOXICILLIN-POT CLAVULANATE 500-125 MG PO TABS: 1.0000 | ORAL_TABLET | Freq: Two times a day (BID) | ORAL | 0 refills | Status: DC

## 2023-09-06 MED ORDER — LEVOTHYROXINE SODIUM 88 MCG PO TABS: 88.0000 ug | ORAL_TABLET | Freq: Every morning | ORAL | 3 refills | Status: AC

## 2023-09-06 NOTE — Progress Notes (Signed)
Established Patient Office Visit  Subjective:  Patient ID: Karen Cunningham, female    DOB: 1931/05/09  Age: 89 y.o. MRN: [id number]  Chief Complaint  Patient presents with   Follow-up    6 mo    Patient comes in for her follow-up accompanied by her family member.  She is in good spirits and has no significant complaints other than having pressure in her sinuses along with postnasal drip, going on for several weeks.  She denies any fever or chills no sore throat but does have congestion in the sinuses and notices colored nasal discharge.  She is taking all her medications regularly and is still living independently. She is concerned about the discolored nasal discharge, will send prescription for Augmentin for 1 week.    No other concerns at this time.   Past Medical History:  Diagnosis Date   (HFpEF) heart failure with preserved ejection fraction (HCC)    a. 07/2016 Echo: EF 51%; b. 12/2016 Echo: EF 41%. Mod TR, mild PR; c. 09/2017 Echo [facility] Flakes): EF 62%, Sev BAE. Mod TR, mild PR; d. 03/2019 Echo(Tekela Garguilo): EF 57%, Gr3 DD. Sev dil LA, mild dil RA. Midly dil Asc Ao. Trace AI. Mild to mod TR, mild PR. RVSP .   Anemia    Arthritis    Carotid arterial disease (HCC)    a. 10/2013 Mod to Sev RICA dzs. Mild dzs in L bulb; b. 10/2013 CTA Carotids: LICA 50p, RICA w/o stenosis.   CKD (chronic kidney disease), stage III (HCC)    Coronary artery disease    a. Several neg MV between 2008 and 2010; b. 2010 s/p PCI/DES to the mLAD; c. 10/2013 MV: Fixed apical defect, likely breast attenuation, no ischemia; d. 09/2014 MV: No ischemia; e. 03/2018 MV [facility] Flakes): no ischemia.   GERD (gastroesophageal reflux disease)    HLD (hyperlipidemia)    Hypertension    Hypothyroidism    Permanent atrial fibrillation (HCC)    a. Dx ~ 2017. CHA2DS2VASc = 6-->coumadin.    Past Surgical History:  Procedure Laterality Date   ABDOMINAL HYSTERECTOMY     APPENDECTOMY     BACK SURGERY     CARDIAC CATHETERIZATION     x1  stent;[facility]   CORONARY STENT PLACEMENT     JOINT REPLACEMENT Left    hip   JOINT REPLACEMENT Bilateral    knees   NISSEN FUNDOPLICATION     THORACOTOMY     TOTAL HIP ARTHROPLASTY Right 03/09/2018   Procedure: TOTAL HIP ARTHROPLASTY;  Surgeon: Donato Heinz, MD;  Location: [facility] ORS;  Service: Orthopedics;  Laterality: Right;    Social History   Socioeconomic History   Marital status: Widowed    Spouse name: Not on file   Number of children: Not on file   Years of education: Not on file   Highest education level: Not on file  Occupational History   Not on file  Tobacco Use   Smoking status: Never   Smokeless tobacco: Never  Vaping Use   Vaping status: Never Used  Substance and Sexual Activity   Alcohol use: Never   Drug use: Never   Sexual activity: Not on file  Other Topics Concern   Not on file  Social History Narrative   Not on file   Social Drivers of Health   Financial Resource Strain: Not on file  Food Insecurity: Not on file  Transportation Needs: Not on file  Physical Activity: Not on file  Stress: Not on file  Social Connections: Not on file  Intimate Partner Violence: Not on file    Family History  Problem Relation Age of Onset   Heart Problems Mother    Heart attack Mother    AAA (abdominal aortic aneurysm) Mother    Hypertension Mother    Heart Problems Father     Allergies  Allergen Reactions   Codeine Nausea And Vomiting   Nsaids     Dr instructed to avoid due to kidney issues    Ibuprofen Other (See Comments)    Dr instructed to avoid due to kidney issues    Prednisone Other (See Comments)    ELEVATED BLOOD PRESSURE.    Outpatient Medications Prior to Visit  Medication Sig   acetaminophen (TYLENOL) 500 MG tablet Take 1 tablet (500 mg total) by mouth every 6 (six) hours as needed for moderate pain (pain).   atorvastatin (LIPITOR) 40 MG tablet TAKE 1 TABLET BY MOUTH AT BEDTIME   Cholecalciferol (VITAMIN D3) 1000 UNITS CAPS Take 1,000  Units by mouth daily.    DILT-XR 240 MG 24 hr capsule Take 1 capsule by mouth once daily   Ferrous Sulfate (IRON) 325 (65 FE) MG TABS Take 325 mg by mouth daily.    furosemide (LASIX) 20 MG tablet Take 1 tablet by mouth once daily   losartan (COZAAR) 50 MG tablet Take 1/2 (one-half) tablet by mouth once daily   Melatonin 10 MG TABS Take 10 mg by mouth at bedtime.   metoprolol tartrate (LOPRESSOR) 50 MG tablet Take 1 tablet (50 mg total) by mouth 2 (two) times daily.   warfarin (COUMADIN) 2 MG tablet TAKE 1 TABLET BY MOUTH IN THE EVENING OR  AS  DIRECTED  BY  ANTICOAGULATION  CLINIC   [DISCONTINUED] levothyroxine (SYNTHROID) 88 MCG tablet TAKE 1 TABLET BY MOUTH IN THE MORNING   [DISCONTINUED] omeprazole (PRILOSEC) 20 MG capsule Take 1 capsule by mouth once daily   No facility-administered medications prior to visit.    Review of Systems  Constitutional: Negative.  Negative for chills, diaphoresis, fever, malaise/fatigue and weight loss.  HENT:  Positive for congestion, hearing loss and sinus pain. Negative for ear discharge, ear pain and sore throat.   Eyes:  Positive for blurred vision.  Respiratory: Negative.  Negative for cough and shortness of breath.   Cardiovascular: Negative.  Negative for chest pain, palpitations and leg swelling.  Gastrointestinal: Negative.  Negative for abdominal pain, constipation, diarrhea, heartburn, nausea and vomiting.  Genitourinary: Negative.  Negative for dysuria and flank pain.  Musculoskeletal: Negative.  Negative for joint pain and myalgias.  Skin: Negative.   Neurological: Negative.  Negative for dizziness, tingling, tremors and headaches.  Endo/Heme/Allergies: Negative.   Psychiatric/Behavioral: Negative.  Negative for depression and suicidal ideas. The patient is not nervous/anxious.        Objective:   BP 120/60   Pulse 65   Ht 5\' 1"  (1.549 m)   Wt 133 lb 12.8 oz (60.7 kg)   LMP  (LMP Unknown)   SpO2 95%   BMI 25.28 kg/m   Vitals:    09/06/23 1402  BP: 120/60  Pulse: 65  Height: 5\' 1"  (1.549 m)  Weight: 133 lb 12.8 oz (60.7 kg)  SpO2: 95%  BMI (Calculated): 25.29    Physical Exam Vitals and nursing note reviewed.  Constitutional:      Appearance: Normal appearance.  HENT:     Head: Normocephalic and atraumatic.     Nose: Nose normal.  Mouth/Throat:     Mouth: Mucous membranes are moist.     Pharynx: Oropharynx is clear.  Eyes:     Conjunctiva/sclera: Conjunctivae normal.     Pupils: Pupils are equal, round, and reactive to light.  Cardiovascular:     Rate and Rhythm: Normal rate and regular rhythm.     Pulses: Normal pulses.     Heart sounds: Normal heart sounds. No murmur heard. Pulmonary:     Effort: Pulmonary effort is normal.     Breath sounds: Normal breath sounds. No wheezing.  Abdominal:     General: Bowel sounds are normal.     Palpations: Abdomen is soft.     Tenderness: There is no abdominal tenderness. There is no right CVA tenderness or left CVA tenderness.  Musculoskeletal:        General: Normal range of motion.     Cervical back: Normal range of motion.     Right lower leg: No edema.     Left lower leg: No edema.  Skin:    General: Skin is warm and dry.  Neurological:     General: No focal deficit present.     Mental Status: She is alert and oriented to person, place, and time.  Psychiatric:        Mood and Affect: Mood normal.        Behavior: Behavior normal.      No results found for any visits on 09/06/23.  Recent Results (from the past 2160 hours)  POCT INR     Status: None   Collection Time: 06/23/23  3:34 PM  Result Value Ref Range   INR 2.1 2.0 - 3.0   POC INR    POCT INR     Status: None   Collection Time: 07/21/23  3:44 PM  Result Value Ref Range   INR 2.6 2.0 - 3.0   POC INR    POCT INR     Status: None   Collection Time: 08/25/23  3:28 PM  Result Value Ref Range   INR 2.4 2.0 - 3.0   POC INR        Assessment & Plan:  Continue all medications.   Check labs today.  Problem List Items Addressed This Visit     Coronary artery disease involving native coronary artery of native heart without angina pectoris   Relevant Orders   CBC with Diff   Essential hypertension, benign - Primary   Relevant Orders   CMP14+EGFR   Thyroid disease   Relevant Medications   levothyroxine (SYNTHROID) 88 MCG tablet   Other Relevant Orders   TSH   Acquired hypothyroidism   Relevant Medications   levothyroxine (SYNTHROID) 88 MCG tablet   Gastroesophageal reflux disease without esophagitis   Relevant Medications   omeprazole (PRILOSEC) 20 MG capsule   Acute non-recurrent maxillary sinusitis   Relevant Medications   amoxicillin-clavulanate (AUGMENTIN) 500-125 MG tablet    Return in about 6 months (around 03/06/2024).   Total time spent: 30 minutes  Margaretann Loveless, MD  09/06/2023   This document may have been prepared by [facility] Voice Recognition software and as such may include unintentional dictation errors.

## 2023-09-07 LAB — CBC WITH DIFFERENTIAL/PLATELET
Basophils Absolute: 0.1 10*3/uL (ref 0.0–0.2)
Basos: 1 %
EOS (ABSOLUTE): 0.1 10*3/uL (ref 0.0–0.4)
Eos: 2 %
Hematocrit: 41.4 % (ref 34.0–46.6)
Hemoglobin: 13.3 g/dL (ref 11.1–15.9)
Immature Grans (Abs): 0 10*3/uL (ref 0.0–0.1)
Immature Granulocytes: 0 %
Lymphocytes Absolute: 1.5 10*3/uL (ref 0.7–3.1)
Lymphs: 17 %
MCH: 29.2 pg (ref 26.6–33.0)
MCHC: 32.1 g/dL (ref 31.5–35.7)
MCV: 91 fL (ref 79–97)
Monocytes Absolute: 0.6 10*3/uL (ref 0.1–0.9)
Monocytes: 7 %
Neutrophils Absolute: 6.4 10*3/uL (ref 1.4–7.0)
Neutrophils: 73 %
Platelets: 220 10*3/uL (ref 150–450)
RBC: 4.55 x10E6/uL (ref 3.77–5.28)
RDW: 13.2 % (ref 11.7–15.4)
WBC: 8.8 10*3/uL (ref 3.4–10.8)

## 2023-09-07 LAB — CMP14+EGFR
ALT: 16 [IU]/L (ref 0–32)
AST: 24 [IU]/L (ref 0–40)
Albumin: 4.3 g/dL (ref 3.6–4.6)
Alkaline Phosphatase: 134 [IU]/L — ABNORMAL HIGH (ref 44–121)
BUN/Creatinine Ratio: 15 (ref 12–28)
BUN: 17 mg/dL (ref 10–36)
Bilirubin Total: 1 mg/dL (ref 0.0–1.2)
CO2: 24 mmol/L (ref 20–29)
Calcium: 9.4 mg/dL (ref 8.7–10.3)
Chloride: 103 mmol/L (ref 96–106)
Creatinine, Ser: 1.14 mg/dL — ABNORMAL HIGH (ref 0.57–1.00)
Globulin, Total: 2.6 g/dL (ref 1.5–4.5)
Glucose: 101 mg/dL — ABNORMAL HIGH (ref 70–99)
Potassium: 4.3 mmol/L (ref 3.5–5.2)
Sodium: 144 mmol/L (ref 134–144)
Total Protein: 6.9 g/dL (ref 6.0–8.5)
eGFR: 46 mL/min/{1.73_m2} — ABNORMAL LOW (ref >59)

## 2023-09-07 LAB — TSH: TSH: 1.43 u[IU]/mL (ref 0.450–4.500)

## 2023-09-08 ENCOUNTER — Ambulatory Visit: Payer: Medicare Other | Admitting: Internal Medicine

## 2023-09-09 IMAGING — CR DG CHEST 2V
1 series · 2 of 2 positions shown · non-contrast
Comparison: September 26, 2018

CLINICAL DATA: Shortness of breath on exertion.

EXAM:
CHEST - 2 VIEW

[Series 1: dg chest 2 view · 0.14mm/px · 2 of 2 slices shown]
[im 1/2]
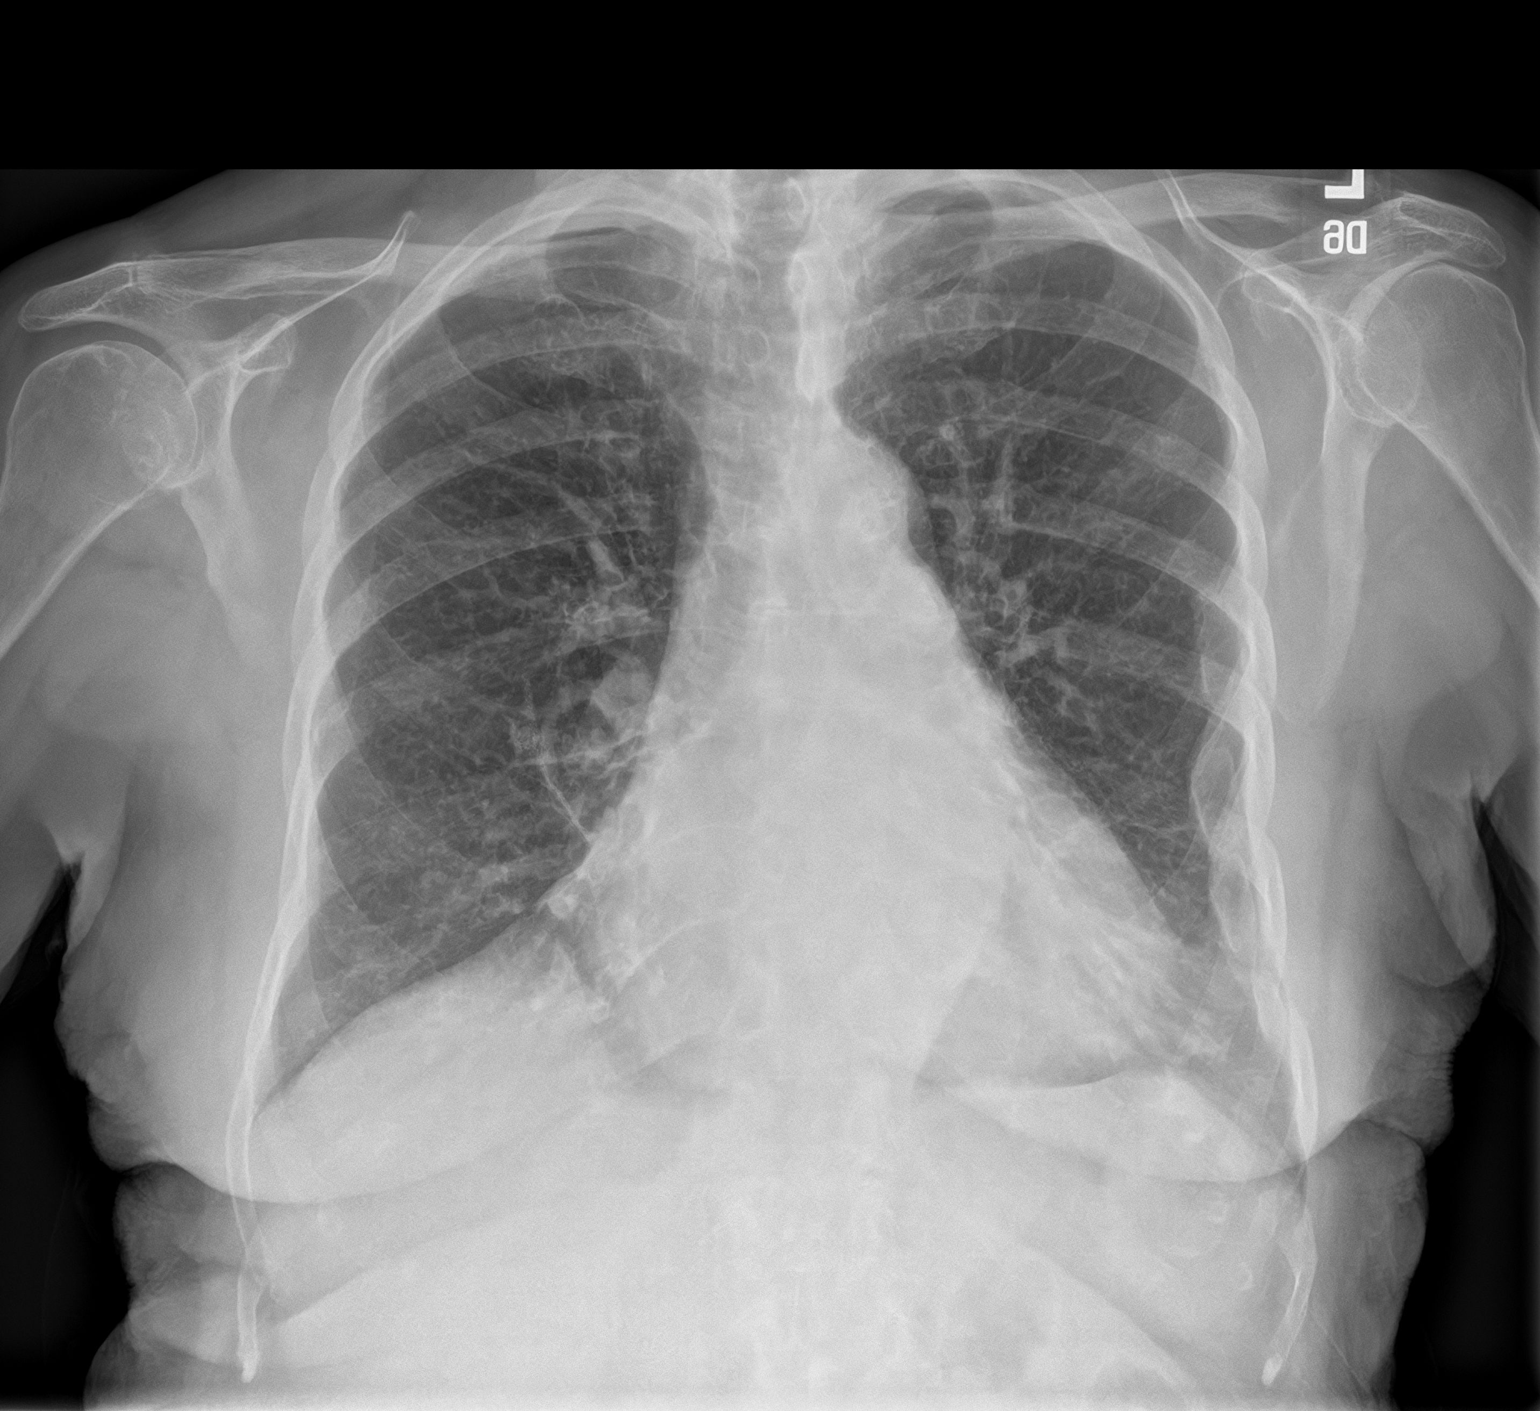
[im 2/2]
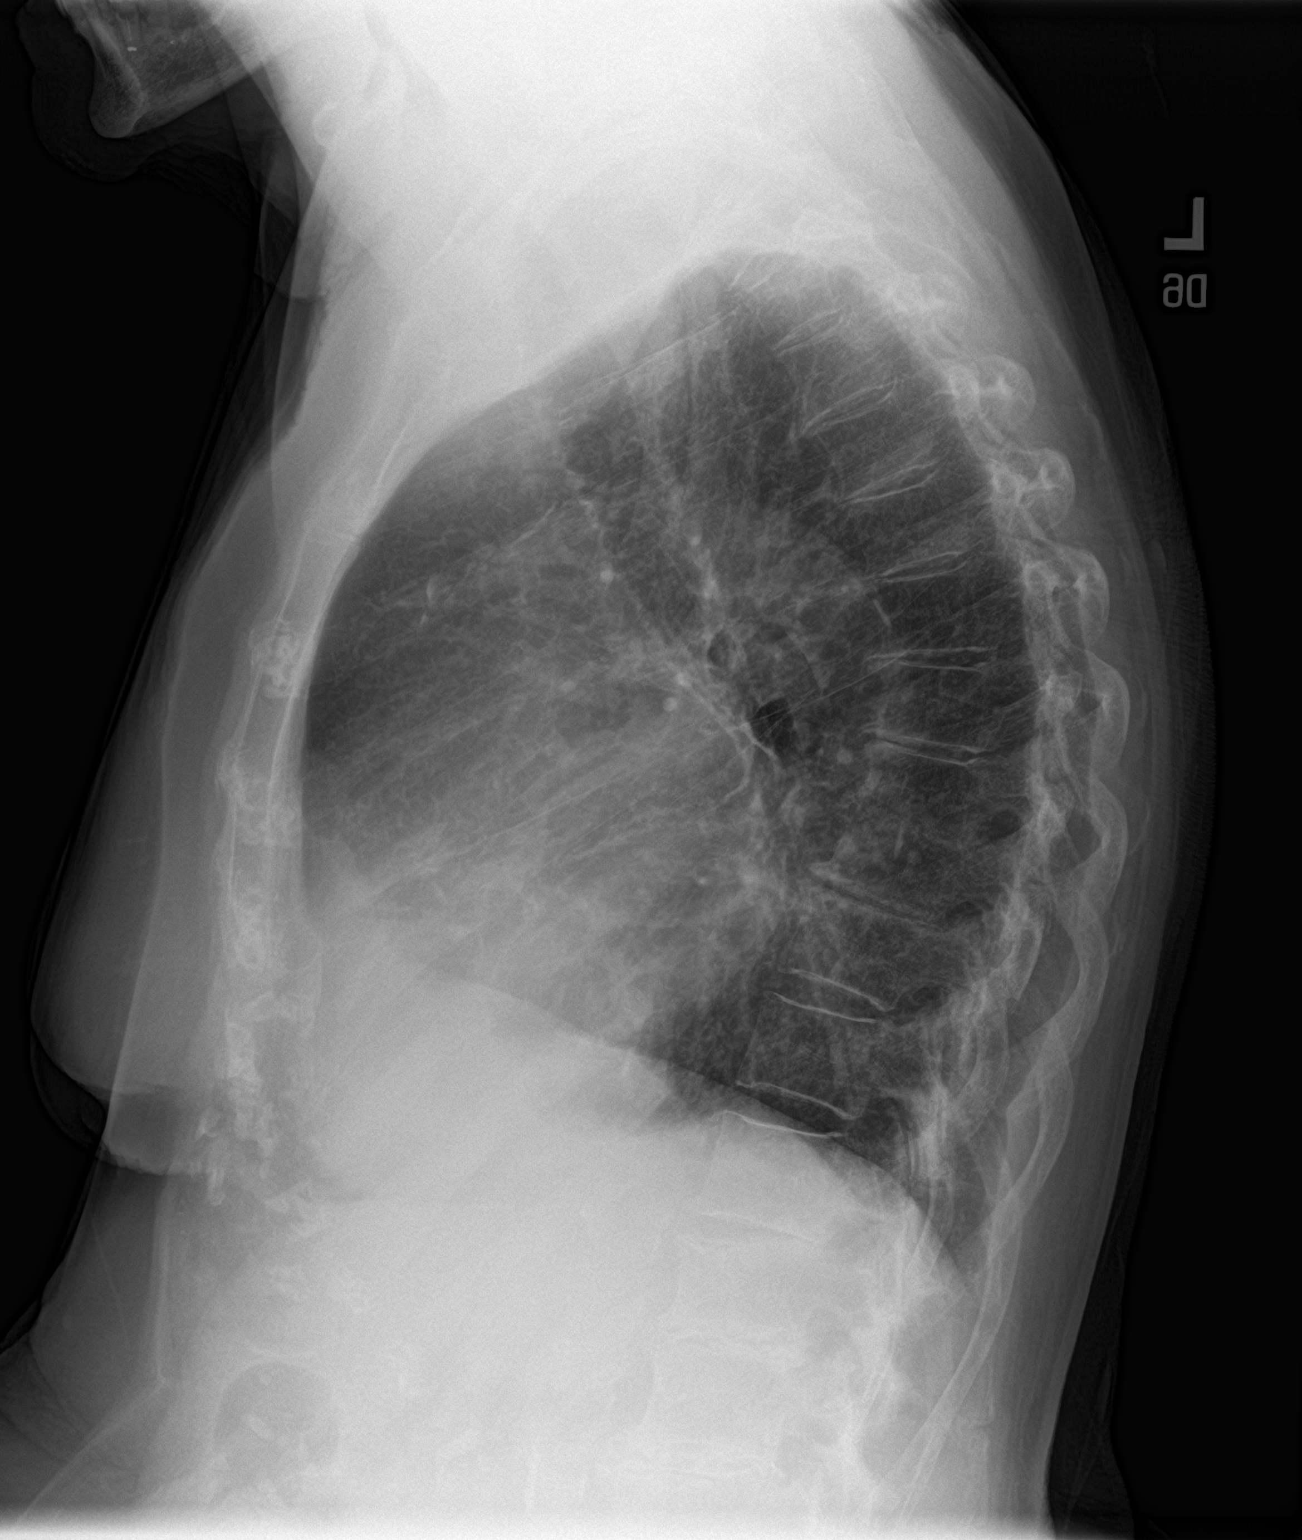

[2 of 2 positions shown; findings below may reference images not displayed]

FINDINGS: Mild opacity in left base is stable since 6666 consistent with scar
atelectasis. Mild cardiomegaly is stable. The hila and mediastinum
are unremarkable. No pneumothorax. No nodules or masses. No acute
focal infiltrates.
IMPRESSION: No acute abnormalities identified. Scar or atelectasis in the left
base.

## 2023-09-27 ENCOUNTER — Encounter: Payer: Self-pay | Admitting: Internal Medicine

## 2023-09-27 ENCOUNTER — Ambulatory Visit: Payer: Medicare Other | Attending: Internal Medicine | Admitting: Internal Medicine

## 2023-09-27 VITALS — BP 109/70 | HR 67 | Ht 60.0 in | Wt 130.6 lb

## 2023-09-27 DIAGNOSIS — I4821 Permanent atrial fibrillation: Secondary | ICD-10-CM

## 2023-09-27 DIAGNOSIS — I340 Nonrheumatic mitral (valve) insufficiency: Secondary | ICD-10-CM | POA: Diagnosis not present

## 2023-09-27 DIAGNOSIS — I10 Essential (primary) hypertension: Secondary | ICD-10-CM

## 2023-09-27 DIAGNOSIS — I350 Nonrheumatic aortic (valve) stenosis: Secondary | ICD-10-CM

## 2023-09-27 DIAGNOSIS — I4891 Unspecified atrial fibrillation: Secondary | ICD-10-CM

## 2023-09-27 DIAGNOSIS — I5032 Chronic diastolic (congestive) heart failure: Secondary | ICD-10-CM | POA: Diagnosis not present

## 2023-09-27 NOTE — Progress Notes (Signed)
 Cardiology Office Note:  .   Date:  09/28/2023  ID:  Karen Cunningham, DOB [birth year], MRN [id number] PCP: Fernand Fredy RAMAN, MD  [facility] Providers Cardiologist:  [name], MD     History of Present Illness: .   Karen Cunningham is a 89 y.o. female coronary artery disease with PCI to the mid LAD in 2010, chronic HFpEF, permanent atrial fibrillation, hypertension, hyperlipidemia, chronic kidney disease, hypothyroidism, and chronic lower extremity edema, who presents for follow-up of CAD, HFpEF, and atrial fibrillation.  I last saw her in 02/2023, at which time his pain was feeling well.  She reported increased energy after we decreased metoprolol  earlier in the year.  We did not make any medication changes or pursue additional testing.  Today, Karen Cunningham reports that she has continued to do fairly well.  She notices occasional palpitations and jumps and her heart rate when she bends over to tie her shoes.  She otherwise remains asymptomatic.  She has mild edema in her legs though overall this seems to have been better with regular furosemide  use.  She is trying to stay adequately hydrated.  She has not had any chest pain, shortness of breath, or lightheadedness.  She continues to bruise easily but has not had any frank bleeding, remaining on warfarin.  ROS: See HPI  Studies Reviewed: SABRA   EKG Interpretation Date/Time:  Monday September 27 2023 16:09:45 EST Ventricular Rate:  67 PR Interval:    QRS Duration:  86 QT Interval:  424 QTC Calculation: 448 R Axis:   69  Text Interpretation: Atrial fibrillation Abnormal ECG When compared with ECG of 12-Mar-2023 No significant change was found Confirmed by Maddyson Keil [phone]) on 09/28/2023 1:41:11 PM    Risk Assessment/Calculations:    CHA2DS2-VASc Score = 6   This indicates a 9.7% annual risk of stroke. The patient's score is based upon: CHF History: 1 HTN History: 1 Diabetes History: 0 Stroke History: 0 Vascular Disease  History: 1 Age Score: 2 Gender Score: 1            Physical Exam:   VS:  BP 109/70 (BP Location: Left Arm, Patient Position: Sitting, Cuff Size: Normal)   Pulse 67   Ht 5' (1.524 m)   Wt 130 lb 9.6 oz (59.2 kg)   LMP  (LMP Unknown)   SpO2 96%   BMI 25.51 kg/m    Wt Readings from Last 3 Encounters:  09/27/23 130 lb 9.6 oz (59.2 kg)  09/06/23 133 lb 12.8 oz (60.7 kg)  04/05/23 134 lb (60.8 kg)    General:  NAD. Neck: No JVD or HJR. Lungs: Clear to auscultation bilaterally without wheezes or crackles. Heart: Irregularly irregular rhythm with 3/6 systolic murmur.  No rubs or gallops. Abdomen: Soft, nontender, nondistended. Extremities: Trace ankle edema bilaterally.  ASSESSMENT AND PLAN: .    Permanent atrial fibrillation: Ventricular rates again seem to be well-controlled today.  Karen Cunningham has some palpitations when bending over to tie her shoes but is otherwise asymptomatic.  We will defer changes to her current rate control regimen consisting of diltiazem  and metoprolol .  Continue warfarin managed by our anticoagulation clinic, given prior concerns of about cost constraints with DOAC's.  Chronic HFpEF and valvular heart disease: Volume status appears stable with only trace pretibial edema on exam today.  We discussed making furosemide  as needed but have agreed to continue with 20 mg daily dosing.  I have asked Karen Cunningham to monitor her weight at  home.  We will plan to repeat an echocardiogram before our follow-up visit in 6 months to reassess Karen Cunningham's aortic stenosis and mitral regurgitation.  Hypertension: Blood pressure well-controlled today.  Defer medication changes at this time.    Dispo: Return to clinic in 6 months with echocardiogram shortly before visit.  Signed, [name], MD

## 2023-09-27 NOTE — Patient Instructions (Signed)
 Medication Instructions:  Your physician recommends that you continue on your current medications as directed. Please refer to the Current Medication list given to you today.   *If you need a refill on your cardiac medications before your next appointment, please call your pharmacy*   Lab Work: No labs ordered today    Testing/Procedures: Your physician has requested that you have an echocardiogram in July . Echocardiography is a painless test that uses sound waves to create images of your heart. It provides your doctor with information about the size and shape of your heart and how well your heart's chambers and valves are working.   You may receive an ultrasound enhancing agent through an IV if needed to better visualize your heart during the echo. This procedure takes approximately one hour.  There are no restrictions for this procedure.  This will take place at [address] [facility], [place] [address]  Please note: We ask at that you not bring children with you during ultrasound (echo/ vascular) testing. Due to room size and safety concerns, children are not allowed in the ultrasound rooms during exams. Our front office staff cannot provide observation of children in our lobby area while testing is being conducted. An adult accompanying a patient to their appointment will only be allowed in the ultrasound room at the discretion of the ultrasound technician under special circumstances. We apologize for any inconvenience.    Follow-Up: At [facility], you and your health needs are our priority.  As part of our continuing mission to provide you with exceptional heart care, we have created designated Provider Care Teams.  These Care Teams include your primary Cardiologist (physician) and Advanced Practice Providers (APPs -  Physician Assistants and Nurse Practitioners) who all work together to provide you with the care you need, when you need it.  We  recommend signing up for the patient portal called MyChart.  Sign up information is provided on this After Visit Summary.  MyChart is used to connect with patients for Virtual Visits (Telemedicine).  Patients are able to view lab/test results, encounter notes, upcoming appointments, etc.  Non-urgent messages can be sent to your provider as well.   To learn more about what you can do with MyChart, go to [email or web address].    Your next appointment:   6 month(s)  Provider:   You may see [name], MD or one of the following Advanced Practice Providers on your designated Care Team:   Lonni Meager, NP Bernardino Bring, PA-C Cadence [place], PA-C Tylene Lunch, NP Barnie Hila, NP

## 2023-09-28 ENCOUNTER — Encounter: Payer: Self-pay | Admitting: Internal Medicine

## 2023-09-28 DIAGNOSIS — I350 Nonrheumatic aortic (valve) stenosis: Secondary | ICD-10-CM | POA: Insufficient documentation

## 2023-10-07 ENCOUNTER — Ambulatory Visit: Payer: PRIVATE HEALTH INSURANCE | Admitting: Internal Medicine

## 2023-10-13 ENCOUNTER — Ambulatory Visit: Payer: Medicare Other

## 2023-10-20 ENCOUNTER — Ambulatory Visit: Payer: Medicare Other | Attending: Internal Medicine

## 2023-10-20 DIAGNOSIS — I4891 Unspecified atrial fibrillation: Secondary | ICD-10-CM | POA: Insufficient documentation

## 2023-10-20 DIAGNOSIS — Z5181 Encounter for therapeutic drug level monitoring: Secondary | ICD-10-CM | POA: Diagnosis present

## 2023-10-20 LAB — POCT INR: INR: 2.7 (ref 2.0–3.0)

## 2023-10-20 NOTE — Patient Instructions (Signed)
Continue 1 TABLET DAILY. Recheck INR in 6 weeks;   Call [phone] with questions or concerns

## 2023-11-18 ENCOUNTER — Other Ambulatory Visit: Payer: Self-pay | Admitting: Internal Medicine

## 2023-11-18 DIAGNOSIS — I4821 Permanent atrial fibrillation: Secondary | ICD-10-CM

## 2023-11-18 NOTE — Telephone Encounter (Signed)
 Refill Request.

## 2023-11-30 ENCOUNTER — Ambulatory Visit: Payer: Medicare Other | Admitting: Podiatry

## 2023-12-01 ENCOUNTER — Ambulatory Visit: Payer: Medicare Other | Attending: Internal Medicine

## 2023-12-01 DIAGNOSIS — Z5181 Encounter for therapeutic drug level monitoring: Secondary | ICD-10-CM | POA: Diagnosis not present

## 2023-12-01 DIAGNOSIS — I4891 Unspecified atrial fibrillation: Secondary | ICD-10-CM | POA: Diagnosis present

## 2023-12-01 LAB — POCT INR: INR: 2.3 (ref 2.0–3.0)

## 2023-12-01 NOTE — Patient Instructions (Signed)
 Continue 1 TABLET DAILY. Recheck INR in 6 weeks;   Call [phone] with questions or concerns

## 2023-12-17 ENCOUNTER — Ambulatory Visit: Admitting: Podiatry

## 2023-12-24 ENCOUNTER — Ambulatory Visit (INDEPENDENT_AMBULATORY_CARE_PROVIDER_SITE_OTHER): Admitting: Podiatry

## 2023-12-24 ENCOUNTER — Encounter: Payer: Self-pay | Admitting: Podiatry

## 2023-12-24 VITALS — Ht 60.0 in | Wt 130.0 lb

## 2023-12-24 DIAGNOSIS — B351 Tinea unguium: Secondary | ICD-10-CM

## 2023-12-24 DIAGNOSIS — M79609 Pain in unspecified limb: Secondary | ICD-10-CM

## 2024-01-11 NOTE — Progress Notes (Signed)
   SUBJECTIVE Patient presents to office today complaining of elongated, thickened nails that cause pain while ambulating in shoes.  Patient is unable to trim their own nails. Patient is here for further evaluation and treatment.  Past Medical History:  Diagnosis Date   (HFpEF) heart failure with preserved ejection fraction ([place])    a. 07/2016 Echo: EF 51%; b. 12/2016 Echo: EF 41%. Mod TR, mild PR; c. 09/2017 Echo [facility]): EF 62%, Sev BAE. Mod TR, mild PR; d. 03/2019 Echo([facility]): EF 57%, Gr3 DD. Sev dil LA, mild dil RA. Midly dil Asc Ao. Trace AI. Mild to mod TR, mild PR. RVSP 48mHg.   Anemia    Arthritis    Carotid arterial disease ([place]    a. 10/2013 Mod to Sev RICA dzs. Mild dzs in L bulb; b. [id number] Carotids: LICA [id number] RICA w/o stenosis.   CKD (chronic kidney disease), stage III (HCC)    Coronary artery disease    a. Several neg MV between 2008 and 2010; b. 2010 s/p PCI/DES to the mLAD; c. 10/2013 MV: Fixed apical defect, likely breast attenuation, no ischemia; d. 09/2014 MV: No ischemia; e. 03/2018 MV ([facility]: no ischemia.   GERD (gastroesophageal reflux disease)    HLD (hyperlipidemia)    Hypertension    Hypothyroidism    Permanent atrial fibrillation ([place]    a. Dx ~ 2017. CHA2DS2VASc = 6-->coumadin.    OBJECTIVE General Patient is awake, alert, and oriented x 3 and in no acute distress. Derm Skin is dry and supple bilateral. Negative open lesions or macerations. Remaining integument unremarkable. Nails are tender, long, thickened and dystrophic with subungual debris, consistent with onychomycosis, 1-5 bilateral. No signs of infection noted. Vasc  DP and PT pedal pulses palpable bilaterally. Temperature gradient within normal limits.  Neuro Epicritic and protective threshold sensation grossly intact bilaterally.  Musculoskeletal Exam No symptomatic pedal deformities noted bilateral. Muscular strength within normal limits.  ASSESSMENT 1.  Pain due to onychomycosis of toenails  both  PLAN OF CARE 1. Patient evaluated today.  2. Instructed to maintain good pedal hygiene and foot care.  3. Mechanical debridement of nails 1-5 bilaterally performed using a nail nipper. Filed with dremel without incident.  4. Return to clinic in 3 mos.   *goes by Miss CGeorgie Chard DPM [facility]  Dr. BEdrick Kins DPM    [address] [place] [address]               Office [phone] Fax [phone]

## 2024-01-12 ENCOUNTER — Ambulatory Visit: Attending: Internal Medicine

## 2024-01-12 DIAGNOSIS — I4891 Unspecified atrial fibrillation: Secondary | ICD-10-CM

## 2024-01-12 DIAGNOSIS — Z5181 Encounter for therapeutic drug level monitoring: Secondary | ICD-10-CM | POA: Diagnosis present

## 2024-01-12 LAB — POCT INR: INR: 2.3 (ref 2.0–3.0)

## 2024-01-12 NOTE — Patient Instructions (Signed)
 Continue 1 TABLET DAILY. Recheck INR in 6 weeks;   Call [phone] with questions or concerns

## 2024-02-17 ENCOUNTER — Other Ambulatory Visit: Payer: Self-pay | Admitting: Internal Medicine

## 2024-02-17 DIAGNOSIS — I4821 Permanent atrial fibrillation: Secondary | ICD-10-CM

## 2024-02-22 ENCOUNTER — Encounter: Payer: Self-pay | Admitting: Internal Medicine

## 2024-02-23 ENCOUNTER — Ambulatory Visit: Attending: Internal Medicine

## 2024-02-23 ENCOUNTER — Telehealth: Payer: Self-pay

## 2024-02-23 DIAGNOSIS — I4891 Unspecified atrial fibrillation: Secondary | ICD-10-CM | POA: Diagnosis present

## 2024-02-23 DIAGNOSIS — Z5181 Encounter for therapeutic drug level monitoring: Secondary | ICD-10-CM | POA: Insufficient documentation

## 2024-02-23 LAB — POCT INR: INR: 2.3 (ref 2.0–3.0)

## 2024-02-23 NOTE — Telephone Encounter (Signed)
 Called to speak with patient - She states "she is fine, just feeling a bit washed out", states she has had a bout of diarrhea early this morning, so she's still feeling washed out today.   BP and HR are normal - patient or daughter will call if anything changes - no further action at this time  Please reference MyChart from 02/22/24 at 11:10    Stein Windhorst

## 2024-02-23 NOTE — Patient Instructions (Signed)
 Continue 1 TABLET DAILY. Recheck INR in 6 weeks;   Call [phone] with questions or concerns

## 2024-03-06 ENCOUNTER — Ambulatory Visit: Payer: PRIVATE HEALTH INSURANCE | Admitting: Internal Medicine

## 2024-03-31 ENCOUNTER — Other Ambulatory Visit: Payer: Medicare Other

## 2024-04-03 ENCOUNTER — Ambulatory Visit (INDEPENDENT_AMBULATORY_CARE_PROVIDER_SITE_OTHER): Payer: PRIVATE HEALTH INSURANCE | Admitting: Internal Medicine

## 2024-04-03 ENCOUNTER — Encounter: Payer: Self-pay | Admitting: Internal Medicine

## 2024-04-03 ENCOUNTER — Ambulatory Visit: Attending: Internal Medicine

## 2024-04-03 VITALS — BP 110/66 | HR 110 | Ht 60.0 in | Wt 129.0 lb

## 2024-04-03 DIAGNOSIS — J301 Allergic rhinitis due to pollen: Secondary | ICD-10-CM | POA: Diagnosis not present

## 2024-04-03 DIAGNOSIS — I10 Essential (primary) hypertension: Secondary | ICD-10-CM

## 2024-04-03 DIAGNOSIS — E039 Hypothyroidism, unspecified: Secondary | ICD-10-CM | POA: Diagnosis not present

## 2024-04-03 DIAGNOSIS — I2510 Atherosclerotic heart disease of native coronary artery without angina pectoris: Secondary | ICD-10-CM

## 2024-04-03 DIAGNOSIS — E782 Mixed hyperlipidemia: Secondary | ICD-10-CM | POA: Diagnosis not present

## 2024-04-03 DIAGNOSIS — I340 Nonrheumatic mitral (valve) insufficiency: Secondary | ICD-10-CM | POA: Insufficient documentation

## 2024-04-03 DIAGNOSIS — I350 Nonrheumatic aortic (valve) stenosis: Secondary | ICD-10-CM | POA: Insufficient documentation

## 2024-04-03 DIAGNOSIS — I4821 Permanent atrial fibrillation: Secondary | ICD-10-CM

## 2024-04-03 LAB — ECHOCARDIOGRAM COMPLETE
AR max vel: 0.89 cm2
AV Area VTI: 0.85 cm2
AV Area mean vel: 0.86 cm2
AV Mean grad: 9.4 mmHg
AV Peak grad: 16.8 mmHg
Ao pk vel: 2.05 m/s
S' Lateral: 3.61 cm

## 2024-04-03 MED ORDER — FLUTICASONE PROPIONATE 50 MCG/ACT NA SUSP: 1.0000 | Freq: Every day | NASAL | 3 refills | Status: AC

## 2024-04-03 MED ORDER — LORATADINE 10 MG PO CAPS
1.0000 | ORAL_CAPSULE | Freq: Every day | ORAL | 3 refills | Status: AC
Start: 2024-04-03 — End: ?

## 2024-04-03 NOTE — Progress Notes (Signed)
 Established Patient Office Visit  Subjective:  Patient ID: Karen Cunningham, female    DOB: April 28, 1931  Age: 89 y.o. MRN: [id number]  Chief Complaint  Patient presents with   Follow-up    6 month follow up    Patient comes in for follow-up accompanied by a family member.  She is in good spirits and is generally feeling well.  Except mentions sinus pressure and congestion with postnasal drip and mucus discharge from her nose.  Denies fever or chills.  Reports that sometimes the mucus is colored.  There is no sore throat.  Suspect allergies.  Will start on Flonase  nasal spray and Claritin  return tablets daily.  Patient has atrial fibrillation but is stable.  Patient is still living independently.    No other concerns at this time.   Past Medical History:  Diagnosis Date   (HFpEF) heart failure with preserved ejection fraction (HCC)    a. 07/2016 Echo: EF 51%; b. 12/2016 Echo: EF 41%. Mod TR, mild PR; c. 09/2017 Echo [facility]): EF 62%, Sev BAE. Mod TR, mild PR; d. 03/2019 Echo([facility]): EF 57%, Gr3 DD. Sev dil LA, mild dil RA. Midly dil Asc Ao. Trace AI. Mild to mod TR, mild PR. RVSP .   Anemia    Arthritis    Carotid arterial disease (HCC)    a. 10/2013 Mod to Sev RICA dzs. Mild dzs in L bulb; b. 10/2013 CTA Carotids: LICA 50p, RICA w/o stenosis.   CKD (chronic kidney disease), stage III (HCC)    Coronary artery disease    a. Several neg MV between 2008 and 2010; b. 2010 s/p PCI/DES to the mLAD; c. 10/2013 MV: Fixed apical defect, likely breast attenuation, no ischemia; d. 09/2014 MV: No ischemia; e. 03/2018 MV [facility]): no ischemia.   GERD (gastroesophageal reflux disease)    HLD (hyperlipidemia)    Hypertension    Hypothyroidism    Permanent atrial fibrillation (HCC)    a. Dx ~ 2017. CHA2DS2VASc = 6-->coumadin .    Past Surgical History:  Procedure Laterality Date   ABDOMINAL HYSTERECTOMY     APPENDECTOMY     BACK SURGERY     CARDIAC CATHETERIZATION     x1 stent;[facility]   CORONARY STENT  PLACEMENT     JOINT REPLACEMENT Left    hip   JOINT REPLACEMENT Bilateral    knees   NISSEN FUNDOPLICATION     THORACOTOMY     TOTAL HIP ARTHROPLASTY Right 03/09/2018   Procedure: TOTAL HIP ARTHROPLASTY;  Surgeon: Mardee Lynwood SQUIBB, MD;  Location: [facility] ORS;  Service: Orthopedics;  Laterality: Right;    Social History   Socioeconomic History   Marital status: Widowed    Spouse name: Not on file   Number of children: Not on file   Years of education: Not on file   Highest education level: Not on file  Occupational History   Not on file  Tobacco Use   Smoking status: Never   Smokeless tobacco: Never  Vaping Use   Vaping status: Never Used  Substance and Sexual Activity   Alcohol use: Never   Drug use: Never   Sexual activity: Not on file  Other Topics Concern   Not on file  Social History Narrative   Not on file   Social Drivers of Health   Financial Resource Strain: Not on file  Food Insecurity: Not on file  Transportation Needs: Not on file  Physical Activity: Not on file  Stress: Not on file  Social  Connections: Not on file  Intimate Partner Violence: Not on file    Family History  Problem Relation Age of [place]set   Heart Problems Mother    Heart attack Mother    AAA (abdominal aortic aneurysm) Mother    Hypertension Mother    Heart Problems Father     Allergies  Allergen Reactions   Codeine Nausea And Vomiting   Nsaids     Dr instructed to avoid due to kidney issues    Ibuprofen Other (See Comments)    Dr instructed to avoid due to kidney issues    Prednisone Other (See Comments)    ELEVATED BLOOD PRESSURE.    Outpatient Medications Prior to Visit  Medication Sig   acetaminophen  (TYLENOL ) 500 MG tablet Take 1 tablet (500 mg total) by mouth every 6 (six) hours as needed for moderate pain (pain).   atorvastatin  (LIPITOR) 40 MG tablet TAKE 1 TABLET BY MOUTH AT BEDTIME   Cholecalciferol  (VITAMIN D3) 1000 UNITS CAPS Take 1,000 Units by mouth daily.     diltiazem  (DILT-XR) 240 MG 24 hr capsule Take 1 capsule by mouth once daily   Ferrous Sulfate  (IRON) 325 (65 FE) MG TABS Take 325 mg by mouth daily.    furosemide  (LASIX ) 20 MG tablet Take 1 tablet by mouth once daily   levothyroxine  (SYNTHROID ) 88 MCG tablet Take 1 tablet (88 mcg total) by mouth every morning.   losartan  (COZAAR ) 50 MG tablet Take 1/2 (one-half) tablet by mouth once daily   Melatonin 10 MG TABS Take 10 mg by mouth at bedtime.   metoprolol  tartrate (LOPRESSOR ) 50 MG tablet Take 1 tablet (50 mg total) by mouth 2 (two) times daily.   omeprazole  (PRILOSEC) 20 MG capsule Take 1 capsule (20 mg total) by mouth daily.   warfarin (COUMADIN ) 2 MG tablet TAKE 1 TABLET BY MOUTH IN THE EVENING OR AS DIRECTED BY ANTICOAGULATION CLINIC   No facility-administered medications prior to visit.    Review of Systems  Constitutional: Negative.  Negative for chills, diaphoresis, fever, malaise/fatigue and weight loss.  HENT:  Positive for congestion. Negative for ear discharge, ear pain, nosebleeds and sore throat.   Eyes: Negative.   Respiratory: Negative.  Negative for cough and shortness of breath.   Cardiovascular: Negative.  Negative for chest pain, palpitations and leg swelling.  Gastrointestinal: Negative.  Negative for abdominal pain, constipation, diarrhea, heartburn, nausea and vomiting.  Genitourinary: Negative.  Negative for dysuria and flank pain.  Musculoskeletal: Negative.  Negative for joint pain and myalgias.  Skin: Negative.   Neurological: Negative.  Negative for dizziness, tingling and headaches.  Endo/Heme/Allergies: Negative.   Psychiatric/Behavioral: Negative.  Negative for depression and suicidal ideas. The patient is not nervous/anxious.        Objective:   BP 110/66   Pulse (!) 110   Ht 5' (1.524 m)   Wt 129 lb (58.5 kg)   LMP  (LMP Unknown)   SpO2 96%   BMI 25.19 kg/m   Vitals:   04/03/24 1307  BP: 110/66  Pulse: (!) 110  Height: 5' (1.524 m)   Weight: 129 lb (58.5 kg)  SpO2: 96%  BMI (Calculated): 25.19    Physical Exam Vitals and nursing note reviewed.  Constitutional:      Appearance: Normal appearance.  HENT:     Head: Normocephalic and atraumatic.     Nose: Nose normal.     Mouth/Throat:     Mouth: Mucous membranes are moist.     Pharynx:  Oropharynx is clear.  Eyes:     Conjunctiva/sclera: Conjunctivae normal.     Pupils: Pupils are equal, round, and reactive to light.  Cardiovascular:     Rate and Rhythm: Rhythm irregular.     Heart sounds: Murmur heard.  Pulmonary:     Effort: Pulmonary effort is normal.     Breath sounds: Normal breath sounds. No wheezing.  Abdominal:     General: Bowel sounds are normal.     Palpations: Abdomen is soft.     Tenderness: There is no abdominal tenderness. There is no right CVA tenderness or left CVA tenderness.  Musculoskeletal:        General: Normal range of motion.     Cervical back: Normal range of motion.     Right lower leg: No edema.     Left lower leg: No edema.  Skin:    General: Skin is warm and dry.  Neurological:     General: No focal deficit present.     Mental Status: She is alert and oriented to person, place, and time.  Psychiatric:        Mood and Affect: Mood normal.        Behavior: Behavior normal.      No results found for any visits on 04/03/24.  Recent Results (from the past 2160 hours)  POCT INR     Status: None   Collection Time: 01/12/24  3:42 PM  Result Value Ref Range   INR 2.3 2.0 - 3.0   POC INR    POCT INR     Status: None   Collection Time: 02/23/24 11:58 AM  Result Value Ref Range   INR 2.3 2.0 - 3.0   POC INR        Assessment & Plan:  Continue current medications.  Add Flonase  nasal spray and Claritin  tablets daily. Problem List Items Addressed This Visit     Permanent atrial fibrillation (HCC)   Coronary artery disease involving native coronary artery of native heart without angina pectoris   Mixed hyperlipidemia    Relevant Orders   Lipid Panel w/o Chol/HDL Ratio   Acquired hypothyroidism   Other Visit Diagnoses       Essential hypertension, benign    -  Primary   Relevant Orders   CMP14+EGFR     Non-seasonal allergic rhinitis due to pollen       Relevant Medications   Loratadine  10 MG CAPS   fluticasone  (FLONASE ) 50 MCG/ACT nasal spray       Return in about 6 months (around 10/04/2024).   Total time spent: 30 minutes  FERNAND FREDY RAMAN, MD  04/03/2024   This document may have been prepared by [facility] Voice Recognition software and as such may include unintentional dictation errors.

## 2024-04-04 ENCOUNTER — Encounter: Payer: Self-pay | Admitting: Podiatry

## 2024-04-04 ENCOUNTER — Ambulatory Visit (INDEPENDENT_AMBULATORY_CARE_PROVIDER_SITE_OTHER): Admitting: Podiatry

## 2024-04-04 ENCOUNTER — Ambulatory Visit: Payer: Self-pay | Admitting: Internal Medicine

## 2024-04-04 VITALS — Ht 60.0 in | Wt 129.0 lb

## 2024-04-04 DIAGNOSIS — M79674 Pain in right toe(s): Secondary | ICD-10-CM

## 2024-04-04 DIAGNOSIS — M79675 Pain in left toe(s): Secondary | ICD-10-CM | POA: Diagnosis not present

## 2024-04-04 DIAGNOSIS — B351 Tinea unguium: Secondary | ICD-10-CM

## 2024-04-04 LAB — LIPID PANEL W/O CHOL/HDL RATIO
Cholesterol, Total: 140 mg/dL (ref 100–199)
HDL: 55 mg/dL (ref >39)
LDL Chol Calc (NIH): 65 mg/dL (ref 0–99)
Triglycerides: 108 mg/dL (ref 0–149)
VLDL Cholesterol Cal: 20 mg/dL (ref 5–40)

## 2024-04-04 LAB — CMP14+EGFR
ALT: 14 IU/L (ref 0–32)
AST: 28 IU/L (ref 0–40)
Albumin: 4.2 g/dL (ref 3.6–4.6)
Alkaline Phosphatase: 127 IU/L — ABNORMAL HIGH (ref 44–121)
BUN/Creatinine Ratio: 17 (ref 12–28)
BUN: 17 mg/dL (ref 10–36)
Bilirubin Total: 0.9 mg/dL (ref 0.0–1.2)
CO2: 20 mmol/L (ref 20–29)
Calcium: 9.3 mg/dL (ref 8.7–10.3)
Chloride: 105 mmol/L (ref 96–106)
Creatinine, Ser: 1.03 mg/dL — ABNORMAL HIGH (ref 0.57–1.00)
Globulin, Total: 2.4 g/dL (ref 1.5–4.5)
Glucose: 162 mg/dL — ABNORMAL HIGH (ref 70–99)
Potassium: 4.1 mmol/L (ref 3.5–5.2)
Sodium: 143 mmol/L (ref 134–144)
Total Protein: 6.6 g/dL (ref 6.0–8.5)
eGFR: 51 mL/min/1.73 — ABNORMAL LOW (ref >59)

## 2024-04-04 NOTE — Progress Notes (Signed)
   SUBJECTIVE Patient presents to office today complaining of elongated, thickened nails that cause pain while ambulating in shoes.  Patient is unable to trim their own nails. Patient is here for further evaluation and treatment.  Past Medical History:  Diagnosis Date   (HFpEF) heart failure with preserved ejection fraction ([place])    a. 07/2016 Echo: EF 51%; b. 12/2016 Echo: EF 41%. Mod TR, mild PR; c. 09/2017 Echo [facility]): EF 62%, Sev BAE. Mod TR, mild PR; d. 03/2019 Echo([facility]): EF 57%, Gr3 DD. Sev dil LA, mild dil RA. Midly dil Asc Ao. Trace AI. Mild to mod TR, mild PR. RVSP 48mHg.   Anemia    Arthritis    Carotid arterial disease ([place]    a. 10/2013 Mod to Sev RICA dzs. Mild dzs in L bulb; b. [id number] Carotids: LICA [id number] RICA w/o stenosis.   CKD (chronic kidney disease), stage III (HCC)    Coronary artery disease    a. Several neg MV between 2008 and 2010; b. 2010 s/p PCI/DES to the mLAD; c. 10/2013 MV: Fixed apical defect, likely breast attenuation, no ischemia; d. 09/2014 MV: No ischemia; e. 03/2018 MV ([facility]: no ischemia.   GERD (gastroesophageal reflux disease)    HLD (hyperlipidemia)    Hypertension    Hypothyroidism    Permanent atrial fibrillation ([place]    a. Dx ~ 2017. CHA2DS2VASc = 6-->coumadin.    OBJECTIVE General Patient is awake, alert, and oriented x 3 and in no acute distress. Derm Skin is dry and supple bilateral. Negative open lesions or macerations. Remaining integument unremarkable. Nails are tender, long, thickened and dystrophic with subungual debris, consistent with onychomycosis, 1-5 bilateral. No signs of infection noted. Vasc  DP and PT pedal pulses palpable bilaterally. Temperature gradient within normal limits.  Neuro Epicritic and protective threshold sensation grossly intact bilaterally.  Musculoskeletal Exam No symptomatic pedal deformities noted bilateral. Muscular strength within normal limits.  ASSESSMENT 1.  Pain due to onychomycosis of toenails  both  PLAN OF CARE 1. Patient evaluated today.  2. Instructed to maintain good pedal hygiene and foot care.  3. Mechanical debridement of nails 1-5 bilaterally performed using a nail nipper. Filed with dremel without incident.  4. Return to clinic in 3 mos.   *goes by Miss CGeorgie Chard DPM [facility]  Dr. BEdrick Kins DPM    [address] [place] [address]               Office [phone] Fax [phone]

## 2024-04-05 ENCOUNTER — Ambulatory Visit: Attending: Internal Medicine

## 2024-04-05 ENCOUNTER — Ambulatory Visit: Payer: Self-pay | Admitting: Internal Medicine

## 2024-04-05 DIAGNOSIS — I4891 Unspecified atrial fibrillation: Secondary | ICD-10-CM | POA: Diagnosis present

## 2024-04-05 DIAGNOSIS — Z5181 Encounter for therapeutic drug level monitoring: Secondary | ICD-10-CM | POA: Insufficient documentation

## 2024-04-05 LAB — POCT INR: INR: 1.4 — AB (ref 2.0–3.0)

## 2024-04-05 NOTE — Patient Instructions (Signed)
 Take 2 tablets tonight only then Continue 1 Tablet Daily. Recheck INR in 3 weeks;   Call [phone] with questions or concerns

## 2024-04-12 ENCOUNTER — Ambulatory Visit: Attending: Internal Medicine | Admitting: Internal Medicine

## 2024-04-12 ENCOUNTER — Encounter: Payer: Self-pay | Admitting: Internal Medicine

## 2024-04-12 VITALS — BP 114/56 | HR 75 | Ht 60.0 in | Wt 127.8 lb

## 2024-04-12 DIAGNOSIS — I4821 Permanent atrial fibrillation: Secondary | ICD-10-CM | POA: Diagnosis not present

## 2024-04-12 DIAGNOSIS — I350 Nonrheumatic aortic (valve) stenosis: Secondary | ICD-10-CM | POA: Diagnosis not present

## 2024-04-12 DIAGNOSIS — I10 Essential (primary) hypertension: Secondary | ICD-10-CM | POA: Diagnosis not present

## 2024-04-12 DIAGNOSIS — I5032 Chronic diastolic (congestive) heart failure: Secondary | ICD-10-CM | POA: Diagnosis not present

## 2024-04-12 MED ORDER — FUROSEMIDE 20 MG PO TABS: 20.0000 mg | ORAL_TABLET | Freq: Every day | ORAL | 3 refills | Status: DC | PRN

## 2024-04-12 NOTE — Progress Notes (Signed)
 Cardiology Office Note:  .   Date:  04/12/2024  ID:  Karen Cunningham, DOB Oct 30, 1930, MRN [id number] PCP: Fernand Fredy RAMAN, MD  [facility] Providers Cardiologist:  [name], MD     History of Present Illness: .   Karen Cunningham is a 89 y.o. female with history of coronary artery disease with PCI to the mid LAD in 2010, chronic HFpEF, permanent atrial fibrillation, mild aortic valve stenosis, hypertension, hyperlipidemia, chronic kidney disease, hypothyroidism, and chronic lower extremity edema, who presents for follow-up of coronary artery disease, HFpEF, and atrial fibrillation.  I last saw her in 09/2023, which time she continued to do fairly well.  She reported occasional palpitations and mild leg edema that had actually improved with more regular furosemide  use.  She denied chest pain and dyspnea.  We deferred medication changes and agreed to obtain a follow-up echocardiogram that demonstrated normal LVEF with mild aortic stenosis and severe biatrial enlargement.  Today, [name] reports that she has been feeling fairly well.  She has chronic exertional dyspnea and shortness of breath when bending over, similar to prior visits.  She denies chest pain, lightheadedness, and edema.  She has rare palpitations that are also stable.  She notes that her INR was recently subtherapeutic after having eaten some okra.  She is scheduled for follow-up in the anticoagulation clinic next week to ensure that her INR is back in range.  ROS: See HPI  Studies Reviewed: SABRA   EKG Interpretation Date/Time:  Wednesday April 12 2024 10:17:16 EDT Ventricular Rate:  75 PR Interval:    QRS Duration:  82 QT Interval:  390 QTC Calculation: 435 R Axis:   70  Text Interpretation: Atrial fibrillation Abnormal ECG When compared with ECG of 27-Sep-2023 16:09, No significant change was found Confirmed by Virl Coble ([id number]) on 04/12/2024 3:05:27 PM    TTE (04/03/2024): Normal LV size and wall thickness.   LVEF 55-60% with normal wall motion.  Indeterminate diastolic parameters.  Normal RV size and function.  Severe biatrial enlargement.  No pericardial effusion.  Degenerative mitral valve with mild regurgitation.  Mild tricuspid regurgitation.  Tricuspid aortic valve with mild to moderate stenosis (mean gradient 12 mmHg AVA 0.9 cm).  CVP 8 mmHg.  Risk Assessment/Calculations:    CHA2DS2-VASc Score = 6   This indicates a 9.7% annual risk of stroke. The patient's score is based upon: CHF History: 1 HTN History: 1 Diabetes History: 0 Stroke History: 0 Vascular Disease History: 1 Age Score: 2 Gender Score: 1            Physical Exam:   VS:  BP (!) 114/56   Pulse 75   Ht 5' (1.524 m)   Wt 127 lb 12.8 oz (58 kg)   LMP  (LMP Unknown)   SpO2 96%   BMI 24.96 kg/m    Wt Readings from Last 3 Encounters:  04/12/24 127 lb 12.8 oz (58 kg)  04/04/24 129 lb (58.5 kg)  04/03/24 129 lb (58.5 kg)    General:  NAD. Neck: No JVD or HJR. Lungs: Clear to auscultation bilaterally without wheezes or crackles. Heart: Irregularly irregular rhythm with 2/6 systolic murmur.  No rubs or gallops. Abdomen: Soft, nontender, nondistended. Extremities: No lower extremity edema.  ASSESSMENT AND PLAN: .    Permanent atrial fibrillation: [name] remains minimally symptomatic with adequate rate control.  Continue current regimen of diltiazem , metoprolol  tartrate, and warfarin, as DOACs have been cost prohibitive in the past.  Chronic  HFpEF: [name] appears euvolemic with stable NYHA class II symptoms.  Given soft blood pressure and no significant edema on exam, I have recommended changing her standing diuretic regimen to furosemide  20 mg daily as needed for weight gain/edema.  Aortic stenosis: Recent echo showed mild to moderate aortic stenosis.  Will continue clinical follow-up with plans to repeat echocardiogram in 1 to 2 years to ensure stability.  Hypertension: Blood pressure borderline low  today, albeit without symptoms.  As above, we will make her furosemide  as needed to minimize the risk for dehydration.  I have also encouraged [name] to stay well-hydrated.  If soft blood pressures remain an issue, de-escalation of losartan  will need to be considered at follow-up.    Dispo: Return to clinic in 6 months.  Signed, [name], MD

## 2024-04-12 NOTE — Patient Instructions (Addendum)
 Medication Instructions:  Your physician recommends the following medication changes.   DECREASE: Lasix  to 20 mg daily as needed for swelling or weight gain of 2 lbs overnight or 5 lbs in a week  *If you need a refill on your cardiac medications before your next appointment, please call your pharmacy*  Lab Work: No labs ordered today    Testing/Procedures: No test ordered today   Follow-Up: At [facility], you and your health needs are our priority.  As part of our continuing mission to provide you with exceptional heart care, our providers are all part of one team.  This team includes your primary Cardiologist (physician) and Advanced Practice Providers or APPs (Physician Assistants and Nurse Practitioners) who all work together to provide you with the care you need, when you need it.  Your next appointment:   6 month(s)  Provider:   You may see [name], MD or one of the following Advanced Practice Providers on your designated Care Team:   Lonni Meager, NP Lesley Maffucci, PA-C Bernardino Bring, PA-C Cadence [place], PA-C Tylene Lunch, NP Barnie Hila, NP

## 2024-04-17 ENCOUNTER — Other Ambulatory Visit: Payer: Self-pay | Admitting: Internal Medicine

## 2024-04-19 ENCOUNTER — Telehealth: Payer: Self-pay | Admitting: Internal Medicine

## 2024-04-19 MED ORDER — METOPROLOL TARTRATE 50 MG PO TABS: 50.0000 mg | ORAL_TABLET | Freq: Two times a day (BID) | ORAL | 3 refills | Status: AC

## 2024-04-19 NOTE — Telephone Encounter (Signed)
*  STAT* If patient is at the pharmacy, call can be transferred to refill team.   1. Which medications need to be refilled? (please list name of each medication and dose if known) metoprolol tartrate (LOPRESSOR) 50 MG tablet   2. Which pharmacy/location (including street and city if local pharmacy) is medication to be sent to? [id number] - [place], [place] - [address]   3. Do they need a 30 day or 90 day supply?  90 

## 2024-04-19 NOTE — Telephone Encounter (Signed)
 Rx sent to pharmacy

## 2024-04-26 ENCOUNTER — Ambulatory Visit: Attending: Internal Medicine

## 2024-04-26 DIAGNOSIS — I4891 Unspecified atrial fibrillation: Secondary | ICD-10-CM | POA: Insufficient documentation

## 2024-04-26 DIAGNOSIS — Z5181 Encounter for therapeutic drug level monitoring: Secondary | ICD-10-CM | POA: Insufficient documentation

## 2024-04-26 LAB — POCT INR: INR: 2.2 (ref 2.0–3.0)

## 2024-04-26 NOTE — Patient Instructions (Signed)
 Continue 1 Tablet Daily. Recheck INR in 6 weeks;   Call [phone] with questions or concerns

## 2024-05-01 ENCOUNTER — Emergency Department

## 2024-05-01 ENCOUNTER — Other Ambulatory Visit: Payer: Self-pay

## 2024-05-01 ENCOUNTER — Inpatient Hospital Stay
Admission: EM | Admit: 2024-05-01 | Discharge: 2024-05-06 | DRG: 280 | Disposition: A | Discharge status: Hospice | Attending: Internal Medicine | Admitting: Internal Medicine

## 2024-05-01 ENCOUNTER — Inpatient Hospital Stay (HOSPITAL_COMMUNITY)
Admit: 2024-05-01 | Discharge: 2024-05-01 | Disposition: A | Discharge status: Home / Self Care | Attending: Internal Medicine

## 2024-05-01 DIAGNOSIS — I130 Hypertensive heart and chronic kidney disease with heart failure and stage 1 through stage 4 chronic kidney disease, or unspecified chronic kidney disease: Secondary | ICD-10-CM | POA: Diagnosis present

## 2024-05-01 DIAGNOSIS — Z8249 Family history of ischemic heart disease and other diseases of the circulatory system: Secondary | ICD-10-CM

## 2024-05-01 DIAGNOSIS — R7401 Elevation of levels of liver transaminase levels: Secondary | ICD-10-CM

## 2024-05-01 DIAGNOSIS — I5023 Acute on chronic systolic (congestive) heart failure: Secondary | ICD-10-CM | POA: Diagnosis present

## 2024-05-01 DIAGNOSIS — I2510 Atherosclerotic heart disease of native coronary artery without angina pectoris: Secondary | ICD-10-CM | POA: Diagnosis present

## 2024-05-01 DIAGNOSIS — Z7189 Other specified counseling: Secondary | ICD-10-CM | POA: Diagnosis not present

## 2024-05-01 DIAGNOSIS — N1831 Chronic kidney disease, stage 3a: Secondary | ICD-10-CM | POA: Diagnosis not present

## 2024-05-01 DIAGNOSIS — I4891 Unspecified atrial fibrillation: Secondary | ICD-10-CM

## 2024-05-01 DIAGNOSIS — I5022 Chronic systolic (congestive) heart failure: Secondary | ICD-10-CM | POA: Diagnosis not present

## 2024-05-01 DIAGNOSIS — Z885 Allergy status to narcotic agent status: Secondary | ICD-10-CM | POA: Diagnosis not present

## 2024-05-01 DIAGNOSIS — K219 Gastro-esophageal reflux disease without esophagitis: Secondary | ICD-10-CM | POA: Diagnosis present

## 2024-05-01 DIAGNOSIS — I509 Heart failure, unspecified: Principal | ICD-10-CM

## 2024-05-01 DIAGNOSIS — I214 Non-ST elevation (NSTEMI) myocardial infarction: Secondary | ICD-10-CM

## 2024-05-01 DIAGNOSIS — Z955 Presence of coronary angioplasty implant and graft: Secondary | ICD-10-CM | POA: Diagnosis not present

## 2024-05-01 DIAGNOSIS — E785 Hyperlipidemia, unspecified: Secondary | ICD-10-CM | POA: Diagnosis present

## 2024-05-01 DIAGNOSIS — I255 Ischemic cardiomyopathy: Secondary | ICD-10-CM | POA: Diagnosis present

## 2024-05-01 DIAGNOSIS — J9602 Acute respiratory failure with hypercapnia: Secondary | ICD-10-CM | POA: Diagnosis present

## 2024-05-01 DIAGNOSIS — I5082 Biventricular heart failure: Secondary | ICD-10-CM | POA: Diagnosis present

## 2024-05-01 DIAGNOSIS — Z96641 Presence of right artificial hip joint: Secondary | ICD-10-CM | POA: Diagnosis present

## 2024-05-01 DIAGNOSIS — I4821 Permanent atrial fibrillation: Secondary | ICD-10-CM | POA: Diagnosis present

## 2024-05-01 DIAGNOSIS — I482 Chronic atrial fibrillation, unspecified: Secondary | ICD-10-CM | POA: Diagnosis present

## 2024-05-01 DIAGNOSIS — N183 Chronic kidney disease, stage 3 unspecified: Secondary | ICD-10-CM

## 2024-05-01 DIAGNOSIS — I10 Essential (primary) hypertension: Secondary | ICD-10-CM | POA: Diagnosis not present

## 2024-05-01 DIAGNOSIS — Z886 Allergy status to analgesic agent status: Secondary | ICD-10-CM | POA: Diagnosis not present

## 2024-05-01 DIAGNOSIS — R54 Age-related physical debility: Secondary | ICD-10-CM | POA: Diagnosis present

## 2024-05-01 DIAGNOSIS — I429 Cardiomyopathy, unspecified: Secondary | ICD-10-CM | POA: Diagnosis not present

## 2024-05-01 DIAGNOSIS — E039 Hypothyroidism, unspecified: Secondary | ICD-10-CM | POA: Diagnosis present

## 2024-05-01 DIAGNOSIS — Z7901 Long term (current) use of anticoagulants: Secondary | ICD-10-CM | POA: Diagnosis not present

## 2024-05-01 DIAGNOSIS — I5033 Acute on chronic diastolic (congestive) heart failure: Secondary | ICD-10-CM

## 2024-05-01 DIAGNOSIS — J9601 Acute respiratory failure with hypoxia: Secondary | ICD-10-CM | POA: Diagnosis present

## 2024-05-01 DIAGNOSIS — Z79899 Other long term (current) drug therapy: Secondary | ICD-10-CM

## 2024-05-01 DIAGNOSIS — J9691 Respiratory failure, unspecified with hypoxia: Secondary | ICD-10-CM | POA: Diagnosis not present

## 2024-05-01 DIAGNOSIS — Z515 Encounter for palliative care: Secondary | ICD-10-CM | POA: Diagnosis not present

## 2024-05-01 DIAGNOSIS — Z888 Allergy status to other drugs, medicaments and biological substances status: Secondary | ICD-10-CM

## 2024-05-01 DIAGNOSIS — I5021 Acute systolic (congestive) heart failure: Secondary | ICD-10-CM | POA: Diagnosis not present

## 2024-05-01 DIAGNOSIS — Z79890 Hormone replacement therapy (postmenopausal): Secondary | ICD-10-CM

## 2024-05-01 DIAGNOSIS — Z90710 Acquired absence of both cervix and uterus: Secondary | ICD-10-CM | POA: Diagnosis not present

## 2024-05-01 DIAGNOSIS — D72829 Elevated white blood cell count, unspecified: Secondary | ICD-10-CM | POA: Diagnosis present

## 2024-05-01 DIAGNOSIS — Z66 Do not resuscitate: Secondary | ICD-10-CM | POA: Diagnosis present

## 2024-05-01 DIAGNOSIS — N1832 Chronic kidney disease, stage 3b: Secondary | ICD-10-CM | POA: Diagnosis present

## 2024-05-01 DIAGNOSIS — R739 Hyperglycemia, unspecified: Secondary | ICD-10-CM | POA: Diagnosis not present

## 2024-05-01 DIAGNOSIS — K761 Chronic passive congestion of liver: Secondary | ICD-10-CM | POA: Diagnosis present

## 2024-05-01 LAB — COMPREHENSIVE METABOLIC PANEL WITH GFR
ALT: 47 U/L — ABNORMAL HIGH (ref 0–44)
AST: 105 U/L — ABNORMAL HIGH (ref 15–41)
Albumin: 4.1 g/dL (ref 3.5–5.0)
Alkaline Phosphatase: 131 U/L — ABNORMAL HIGH (ref 38–126)
Anion gap: 11 (ref 5–15)
BUN: 19 mg/dL (ref 8–23)
CO2: 24 mmol/L (ref 22–32)
Calcium: 9.1 mg/dL (ref 8.9–10.3)
Chloride: 109 mmol/L (ref 98–111)
Creatinine, Ser: 0.98 mg/dL (ref 0.44–1.00)
GFR, Estimated: 54 mL/min — ABNORMAL LOW (ref >60)
Glucose, Bld: 263 mg/dL — ABNORMAL HIGH (ref 70–99)
Potassium: 3.6 mmol/L (ref 3.5–5.1)
Sodium: 144 mmol/L (ref 135–145)
Total Bilirubin: 0.9 mg/dL (ref 0.0–1.2)
Total Protein: 8 g/dL (ref 6.5–8.1)

## 2024-05-01 LAB — BLOOD GAS, VENOUS
Acid-base deficit: 5.7 mmol/L — ABNORMAL HIGH (ref 0.0–2.0)
Bicarbonate: 24.6 mmol/L (ref 20.0–28.0)
O2 Saturation: 84.4 %
Patient temperature: 37
pCO2, Ven: 69 mmHg — ABNORMAL HIGH (ref 44–60)
pH, Ven: 7.16 — CL (ref 7.25–7.43)
pO2, Ven: 60 mmHg — ABNORMAL HIGH (ref 32–45)

## 2024-05-01 LAB — TSH: TSH: 3.03 u[IU]/mL (ref 0.350–4.500)

## 2024-05-01 LAB — CBC WITH DIFFERENTIAL/PLATELET
Abs Immature Granulocytes: 0.06 K/uL (ref 0.00–0.07)
Basophils Absolute: 0.1 K/uL (ref 0.0–0.1)
Basophils Relative: 1 %
Eosinophils Absolute: 0.2 K/uL (ref 0.0–0.5)
Eosinophils Relative: 2 %
HCT: 46.1 % — ABNORMAL HIGH (ref 36.0–46.0)
Hemoglobin: 14.1 g/dL (ref 12.0–15.0)
Immature Granulocytes: 1 %
Lymphocytes Relative: 34 %
Lymphs Abs: 4 K/uL (ref 0.7–4.0)
MCH: 28.7 pg (ref 26.0–34.0)
MCHC: 30.6 g/dL (ref 30.0–36.0)
MCV: 93.7 fL (ref 80.0–100.0)
Monocytes Absolute: 0.6 K/uL (ref 0.1–1.0)
Monocytes Relative: 5 %
Neutro Abs: 6.8 K/uL (ref 1.7–7.7)
Neutrophils Relative %: 57 %
Platelets: 235 K/uL (ref 150–400)
RBC: 4.92 MIL/uL (ref 3.87–5.11)
RDW: 15.2 % (ref 11.5–15.5)
WBC: 11.7 K/uL — ABNORMAL HIGH (ref 4.0–10.5)
nRBC: 0 % (ref 0.0–0.2)

## 2024-05-01 LAB — URINALYSIS, COMPLETE (UACMP) WITH MICROSCOPIC
Bilirubin Urine: NEGATIVE
Glucose, UA: 150 mg/dL — AB
Ketones, ur: NEGATIVE mg/dL
Leukocytes,Ua: NEGATIVE
Nitrite: NEGATIVE
Protein, ur: NEGATIVE mg/dL
Specific Gravity, Urine: 1.006 (ref 1.005–1.030)
pH: 5 (ref 5.0–8.0)

## 2024-05-01 LAB — CK: Total CK: 660 U/L — ABNORMAL HIGH (ref 38–234)

## 2024-05-01 LAB — PROTIME-INR
INR: 1.7 — ABNORMAL HIGH (ref 0.8–1.2)
Prothrombin Time: 20.6 s — ABNORMAL HIGH (ref 11.4–15.2)

## 2024-05-01 LAB — MAGNESIUM: Magnesium: 2 mg/dL (ref 1.7–2.4)

## 2024-05-01 LAB — PHOSPHORUS: Phosphorus: 4.2 mg/dL (ref 2.5–4.6)

## 2024-05-01 LAB — TROPONIN I (HIGH SENSITIVITY)
Troponin I (High Sensitivity): 18600 ng/L (ref <18)
Troponin I (High Sensitivity): 14167 ng/L (ref <18)

## 2024-05-01 LAB — GLUCOSE, CAPILLARY: Glucose-Capillary: 229 mg/dL — ABNORMAL HIGH (ref 70–99)

## 2024-05-01 LAB — RESP PANEL BY RT-PCR (RSV, FLU A&B, COVID)  RVPGX2
Influenza A by PCR: NEGATIVE
Influenza B by PCR: NEGATIVE
Resp Syncytial Virus by PCR: NEGATIVE
SARS Coronavirus 2 by RT PCR: NEGATIVE

## 2024-05-01 LAB — APTT: aPTT: 29 s (ref 24–36)

## 2024-05-01 LAB — HEPARIN LEVEL (UNFRACTIONATED): Heparin Unfractionated: 0.43 [IU]/mL (ref 0.30–0.70)

## 2024-05-01 LAB — CBG MONITORING, ED: Glucose-Capillary: 164 mg/dL — ABNORMAL HIGH (ref 70–99)

## 2024-05-01 LAB — BRAIN NATRIURETIC PEPTIDE: B Natriuretic Peptide: 955.8 pg/mL — ABNORMAL HIGH (ref 0.0–100.0)

## 2024-05-01 MED ORDER — METOPROLOL TARTRATE 25 MG PO TABS: 25.0000 mg | ORAL_TABLET | Freq: Two times a day (BID) | ORAL | Status: DC

## 2024-05-01 MED ORDER — METOPROLOL TARTRATE 5 MG/5ML IV SOLN
5.0000 mg | INTRAVENOUS | Status: DC | PRN
  Administered 2024-05-01 – 2024-05-02 (×8): 5 mg via INTRAVENOUS
  Filled 2024-05-01 (×4): qty 5

## 2024-05-01 MED ORDER — ATORVASTATIN CALCIUM 20 MG PO TABS
40.0000 mg | ORAL_TABLET | Freq: Every day | ORAL | Status: DC
  Administered 2024-05-02 – 2024-05-05 (×6): 40 mg via ORAL
  Filled 2024-05-01 (×4): qty 2

## 2024-05-01 MED ORDER — METOPROLOL TARTRATE 50 MG PO TABS: 75.0000 mg | ORAL_TABLET | Freq: Two times a day (BID) | ORAL | Status: DC

## 2024-05-01 MED ORDER — SODIUM CHLORIDE 0.9% FLUSH
3.0000 mL | Freq: Two times a day (BID) | INTRAVENOUS | Status: DC
  Administered 2024-05-01 – 2024-05-05 (×16): 3 mL via INTRAVENOUS
  Administered 2024-05-06: 20 mL via INTRAVENOUS

## 2024-05-01 MED ORDER — FLUTICASONE PROPIONATE 50 MCG/ACT NA SUSP
1.0000 | Freq: Every day | NASAL | Status: DC
  Administered 2024-05-01 – 2024-05-06 (×9): 1 via NASAL
  Filled 2024-05-01: qty 16

## 2024-05-01 MED ORDER — METOPROLOL TARTRATE 50 MG PO TABS: 50.0000 mg | ORAL_TABLET | Freq: Two times a day (BID) | ORAL | Status: DC

## 2024-05-01 MED ORDER — DIGOXIN 0.25 MG/ML IJ SOLN
0.1250 mg | Freq: Once | INTRAMUSCULAR | Status: AC
  Administered 2024-05-01 (×2): 0.125 mg via INTRAVENOUS
  Filled 2024-05-01 (×2): qty 2

## 2024-05-01 MED ORDER — ONDANSETRON HCL 4 MG/2ML IJ SOLN: 4.0000 mg | Freq: Four times a day (QID) | INTRAMUSCULAR | Status: DC | PRN

## 2024-05-01 MED ORDER — MELATONIN 5 MG PO TABS
10.0000 mg | ORAL_TABLET | Freq: Every day | ORAL | Status: DC
  Administered 2024-05-02 – 2024-05-05 (×6): 10 mg via ORAL
  Filled 2024-05-01 (×4): qty 2

## 2024-05-01 MED ORDER — LORATADINE 10 MG PO TABS
5.0000 mg | ORAL_TABLET | Freq: Every day | ORAL | Status: DC
  Administered 2024-05-01 – 2024-05-06 (×7): 5 mg via ORAL
  Filled 2024-05-01 (×5): qty 1

## 2024-05-01 MED ORDER — LEVOTHYROXINE SODIUM 88 MCG PO TABS
88.0000 ug | ORAL_TABLET | Freq: Every day | ORAL | Status: DC
  Administered 2024-05-01 – 2024-05-06 (×7): 88 ug via ORAL
  Filled 2024-05-01 (×7): qty 1

## 2024-05-01 MED ORDER — OLANZAPINE 10 MG IM SOLR
2.5000 mg | Freq: Once | INTRAMUSCULAR | Status: AC
  Administered 2024-05-01 (×2): 2.5 mg via INTRAMUSCULAR
  Filled 2024-05-01: qty 10

## 2024-05-01 MED ORDER — METOPROLOL TARTRATE 25 MG PO TABS
25.0000 mg | ORAL_TABLET | Freq: Four times a day (QID) | ORAL | Status: DC
  Administered 2024-05-01 – 2024-05-02 (×8): 25 mg via ORAL
  Filled 2024-05-01 (×4): qty 1

## 2024-05-01 MED ORDER — METOPROLOL TARTRATE 5 MG/5ML IV SOLN
5.0000 mg | Freq: Once | INTRAVENOUS | Status: AC
  Administered 2024-05-01 (×2): 5 mg via INTRAVENOUS
  Filled 2024-05-01: qty 5

## 2024-05-01 MED ORDER — LOSARTAN POTASSIUM 50 MG PO TABS
50.0000 mg | ORAL_TABLET | Freq: Every day | ORAL | Status: DC
  Administered 2024-05-01 (×2): 50 mg via ORAL
  Filled 2024-05-01: qty 1

## 2024-05-01 MED ORDER — INSULIN ASPART 100 UNIT/ML IJ SOLN
0.0000 [IU] | Freq: Three times a day (TID) | INTRAMUSCULAR | Status: DC
  Administered 2024-05-01 (×2): 2 [IU] via SUBCUTANEOUS
  Administered 2024-05-01 (×2): 3 [IU] via SUBCUTANEOUS
  Administered 2024-05-02 (×2): 1 [IU] via SUBCUTANEOUS
  Administered 2024-05-03 (×2): 2 [IU] via SUBCUTANEOUS
  Administered 2024-05-04: 1 [IU] via SUBCUTANEOUS
  Administered 2024-05-04: 2 [IU] via SUBCUTANEOUS
  Administered 2024-05-05 (×2): 1 [IU] via SUBCUTANEOUS
  Administered 2024-05-06: 2 [IU] via SUBCUTANEOUS
  Filled 2024-05-01 (×8): qty 1
  Filled 2024-05-01: qty 2

## 2024-05-01 MED ORDER — ACETAMINOPHEN 500 MG PO TABS
500.0000 mg | ORAL_TABLET | Freq: Four times a day (QID) | ORAL | Status: DC | PRN
  Administered 2024-05-05 – 2024-05-06 (×3): 500 mg via ORAL
  Filled 2024-05-01 (×3): qty 1

## 2024-05-01 MED ORDER — FUROSEMIDE 10 MG/ML IJ SOLN
40.0000 mg | Freq: Once | INTRAMUSCULAR | Status: AC
  Administered 2024-05-01 (×2): 40 mg via INTRAVENOUS
  Filled 2024-05-01: qty 4

## 2024-05-01 MED ORDER — PERFLUTREN LIPID MICROSPHERE
1.0000 mL | INTRAVENOUS | Status: AC | PRN
  Administered 2024-05-01 (×2): 3 mL via INTRAVENOUS

## 2024-05-01 MED ORDER — SODIUM CHLORIDE 0.9 % IV SOLN: 250.0000 mL | INTRAVENOUS | Status: AC | PRN

## 2024-05-01 MED ORDER — POTASSIUM CHLORIDE CRYS ER 20 MEQ PO TBCR
40.0000 meq | EXTENDED_RELEASE_TABLET | Freq: Once | ORAL | Status: AC
  Administered 2024-05-01 (×2): 40 meq via ORAL
  Filled 2024-05-01: qty 2

## 2024-05-01 MED ORDER — HEPARIN BOLUS VIA INFUSION
3000.0000 [IU] | Freq: Once | INTRAVENOUS | Status: AC
  Administered 2024-05-01 (×2): 3000 [IU] via INTRAVENOUS
  Filled 2024-05-01: qty 3000

## 2024-05-01 MED ORDER — FUROSEMIDE 10 MG/ML IJ SOLN
40.0000 mg | Freq: Two times a day (BID) | INTRAMUSCULAR | Status: DC
  Administered 2024-05-01 – 2024-05-04 (×11): 40 mg via INTRAVENOUS
  Filled 2024-05-01 (×6): qty 4

## 2024-05-01 MED ORDER — HEPARIN (PORCINE) 25000 UT/250ML-% IV SOLN
900.0000 [IU]/h | INTRAVENOUS | Status: DC
  Administered 2024-05-01 – 2024-05-02 (×4): 600 [IU]/h via INTRAVENOUS
  Administered 2024-05-04: 900 [IU]/h via INTRAVENOUS
  Filled 2024-05-01 (×3): qty 250

## 2024-05-01 MED ORDER — PANTOPRAZOLE SODIUM 40 MG PO TBEC
40.0000 mg | DELAYED_RELEASE_TABLET | Freq: Every day | ORAL | Status: DC
  Administered 2024-05-01 – 2024-05-06 (×7): 40 mg via ORAL
  Filled 2024-05-01 (×5): qty 1

## 2024-05-01 MED ORDER — ASPIRIN 81 MG PO CHEW
324.0000 mg | CHEWABLE_TABLET | Freq: Once | ORAL | Status: AC
  Administered 2024-05-01 (×2): 324 mg via ORAL
  Filled 2024-05-01: qty 4

## 2024-05-01 MED ORDER — SODIUM CHLORIDE 0.9% FLUSH: 3.0000 mL | INTRAVENOUS | Status: DC | PRN

## 2024-05-01 NOTE — Consult Note (Signed)
 Cardiology Consultation   Patient ID: NIVEA WOJDYLA MRN: [id number]; DOB: 1931-05-20  Admit date: 05/01/2024 Date of Consult: 05/01/2024  PCP:  Fernand Fredy RAMAN, MD   [facility] Providers Cardiologist:  [name], MD        Patient Profile: Karen Cunningham is a 89 y.o. female with a hx of CAD s/p PCI to the mid LAD in 2010, chronic HFpEF, permanent atrial fibrillation on Coumadin , carotid artery disease, mild aortic valve stenosis, hypertension, hyperlipidemia, CKD, hypothyroidism, and chronic lower extremity edema who is being seen 05/01/2024 for the evaluation of NSTEMI and atrial fibrillation with RVR at the request of Dr. Laurita.  History of Present Illness:   Ms. Papania was previously followed by Dr. Deretha and underwent PCI and stenting to the mid LAD in 2010.  She was diagnosed with atrial fibrillation in 2017.  She was started on Coumadin  for anticoagulation.  Cardioversion was recommended but she deferred this in favor of rate control.  Patient established with Dr. Hanson 08/2019.  She had long-term monitor 11/2019 which showed 100% atrial fibrillation burden which was rate controlled.  Most recent echo 04/03/2024 showed EF 55 to 60%, normal RV size and function, severely dilated left and right atrial size, mild MR, mild AS.  She was most recently seen by Dr. Hanson 04/12/2024 and overall doing well from a cardiac perspective.  Given euvolemia and soft blood pressure, it was recommended that she change her diuretic regimen to an as needed basis.    Patient reports that she was in her usual state of health until Saturday when she noticed some discomfort in the middle of her chest.  She presumed that this was due to indigestion as the pain was mild and resolved after about an hour.  She felt better until this morning when she woke up to use the bathroom and generally felt unwell.  She reports that she tried to lay down and go back to sleep but felt restless and could not get  comfortable.  She reports feeling as if her heart was racing and shortness of breath. She denies lightheadedness, dizziness, diaphoresis, nausea, and lower extremity swelling.  On EMS arrival, patient was noted to have O2 saturations in the mid 80s.  She was placed on CPAP with improvement and given 2 DuoNebs as well as 125 mg of IV Solu-Medrol.  In the ED, BP 170/136, HR 166, RR 48 with otherwise normal vital signs.  Labs notable for K 3.6, BUN 19, creatinine 0.98, AST and ALT mildly elevated, WBC 11.7.  BNP 955.  Troponin 18,600 > 14,167.  EKG showed atrial fibrillation with rate 171 bpm and likely rate related repolarization abnormality.  Chest x-ray showed diffusely increased interstitial opacities consistent with likely pulmonary edema and possible small pleural effusions.  She was given IV metoprolol  for rate control and diuresed with IV Lasix .  She was also given loading dose of aspirin  and started on IV heparin .  At time of cardiology exam, patient is resting fairly comfortably in bed.  She has been weaned to 4 L nasal cannula.  She reports some ongoing shortness of breath and feeling of heart racing.  She denies any further chest pain since Saturday.  Past Medical History:  Diagnosis Date   (HFpEF) heart failure with preserved ejection fraction (HCC)    a. 07/2016 Echo: EF 51%; b. 12/2016 Echo: EF 41%. Mod TR, mild PR; c. 09/2017 Echo [facility]): EF 62%, Sev BAE. Mod TR, mild PR; d. 03/2019 Echo([facility]):  EF 57%, Gr3 DD. Sev dil LA, mild dil RA. Midly dil Asc Ao. Trace AI. Mild to mod TR, mild PR. RVSP .   Anemia    Arthritis    Carotid arterial disease (HCC)    a. 10/2013 Mod to Sev RICA dzs. Mild dzs in L bulb; b. 10/2013 CTA Carotids: LICA 50p, RICA w/o stenosis.   CKD (chronic kidney disease), stage III (HCC)    Coronary artery disease    a. Several neg MV between 2008 and 2010; b. 2010 s/p PCI/DES to the mLAD; c. 10/2013 MV: Fixed apical defect, likely breast attenuation, no ischemia; d. 09/2014  MV: No ischemia; e. 03/2018 MV [facility]): no ischemia.   GERD (gastroesophageal reflux disease)    HLD (hyperlipidemia)    Hypertension    Hypothyroidism    Permanent atrial fibrillation (HCC)    a. Dx ~ 2017. CHA2DS2VASc = 6-->coumadin .    Past Surgical History:  Procedure Laterality Date   ABDOMINAL HYSTERECTOMY     APPENDECTOMY     BACK SURGERY     CARDIAC CATHETERIZATION     x1 stent;[facility]   CORONARY STENT PLACEMENT     JOINT REPLACEMENT Left    hip   JOINT REPLACEMENT Bilateral    knees   NISSEN FUNDOPLICATION     THORACOTOMY     TOTAL HIP ARTHROPLASTY Right 03/09/2018   Procedure: TOTAL HIP ARTHROPLASTY;  Surgeon: Mardee Lynwood SQUIBB, MD;  Location: [facility] ORS;  Service: Orthopedics;  Laterality: Right;       Scheduled Meds:  atorvastatin   40 mg Oral QHS   fluticasone   1 spray Each Nare Daily   furosemide   40 mg Intravenous BID   insulin  aspart  0-9 Units Subcutaneous TID WC   levothyroxine   88 mcg Oral Q0600   loratadine   5 mg Oral Daily   melatonin  10 mg Oral QHS   metoprolol  tartrate  25 mg Oral Q6H   pantoprazole   40 mg Oral Daily   sodium chloride  flush  3 mL Intravenous Q12H   Continuous Infusions:  sodium chloride      heparin  600 Units/hr (05/01/24 0958)   PRN Meds: sodium chloride , acetaminophen , metoprolol  tartrate, ondansetron  (ZOFRAN ) IV, sodium chloride  flush  Allergies:    Allergies  Allergen Reactions   Codeine Nausea And Vomiting   Nsaids     Dr instructed to avoid due to kidney issues    Ibuprofen Other (See Comments)    Dr instructed to avoid due to kidney issues    Prednisone Other (See Comments)    ELEVATED BLOOD PRESSURE.    Social History:   Social History   Socioeconomic History   Marital status: Widowed    Spouse name: Not on file   Number of children: Not on file   Years of education: Not on file   Highest education level: Not on file  Occupational History   Not on file  Tobacco Use   Smoking status: Never   Smokeless  tobacco: Never  Vaping Use   Vaping status: Never Used  Substance and Sexual Activity   Alcohol use: Never   Drug use: Never   Sexual activity: Not on file  Other Topics Concern   Not on file  Social History Narrative   Not on file   Social Drivers of Health   Financial Resource Strain: Not on file  Food Insecurity: Not on file  Transportation Needs: Not on file  Physical Activity: Not on file  Stress: Not on file  Social Connections: Not on file  Intimate Partner Violence: Not on file    Family History:    Family History  Problem Relation Age of Onset   Heart Problems Mother    Heart attack Mother    AAA (abdominal aortic aneurysm) Mother    Hypertension Mother    Heart Problems Father      ROS:  Please see the history of present illness.   Physical Exam/Data: Vitals:   05/01/24 0845 05/01/24 1000 05/01/24 1100 05/01/24 1119  BP:  (!) 145/82 117/85   Pulse: 76 84 (!) 111   Resp: (!) 22 20 (!) 21   Temp:    97.8 F (36.6 C)  TempSrc:    Oral  SpO2: 100% 94% 97%   Weight:      Height:       No intake or output data in the 24 hours ending 05/01/24 1212    05/01/2024    7:22 AM 04/12/2024   10:10 AM 04/04/2024   11:03 AM  Last 3 Weights  Weight (lbs) 110 lb 7.2 oz 127 lb 12.8 oz 129 lb  Weight (kg) 50.1 kg 57.97 kg 58.514 kg     Body mass index is 21.57 kg/m.  General:  Well nourished, well developed, in no acute distress HEENT: normal Neck: no JVD Vascular: No carotid bruits; Distal pulses 2+ bilaterally Cardiac:  tachycardic, IRIR; normal S1, S2; 2/6 systolic murmur Lungs: reduced breath sounds bilaterally with fine crackles Abd: soft, nontender, no hepatomegaly  Ext: no edema Skin: warm and dry  Psych:  Normal affect   EKG:  The EKG was personally reviewed and demonstrates:  atrial fibrillation with likely rate related repolarization abnormality, rate 171 Telemetry:  Telemetry was personally reviewed and demonstrates:  atrial fibrillation, rate  120s-150s bpm  Relevant CV Studies:  04/03/2024 Echo complete 1. Left ventricular ejection fraction, by estimation, is 55 to 60%. The  left ventricle has normal function. The left ventricle has no regional  wall motion abnormalities. Left ventricular diastolic parameters are  indeterminate.   2. Right ventricular systolic function is normal. The right ventricular  size is normal.   3. Left atrial size was severely dilated.   4. Right atrial size was severely dilated.   5. The mitral valve is degenerative. Mild mitral valve regurgitation.   6. Mean Aov gradient , PG . The aortic valve is tricuspid.  Aortic valve regurgitation is not visualized. Mild aortic valve stenosis.   7. The inferior vena cava is normal in size with <50% respiratory  variability, suggesting right atrial pressure of 8 mmHg.   Laboratory Data: High Sensitivity Troponin:   Recent Labs  Lab 05/01/24 0728 05/01/24 0903  TROPONINIHS 18,600* 14,167*     Chemistry Recent Labs  Lab 05/01/24 0728 05/01/24 0903  NA 144  --   K 3.6  --   CL 109  --   CO2 24  --   GLUCOSE 263*  --   BUN 19  --   CREATININE 0.98  --   CALCIUM  9.1  --   MG  --  2.0  GFRNONAA 54*  --   ANIONGAP 11  --     Recent Labs  Lab 05/01/24 0728  PROT 8.0  ALBUMIN 4.1  AST 105*  ALT 47*  ALKPHOS 131*  BILITOT 0.9   Lipids No results for input(s): CHOL, TRIG, HDL, LABVLDL, LDLCALC, CHOLHDL in the last 168 hours.  Hematology Recent Labs  Lab 05/01/24 0728  WBC  11.7*  RBC 4.92  HGB 14.1  HCT 46.1*  MCV 93.7  MCH 28.7  MCHC 30.6  RDW 15.2  PLT 235   Thyroid   Recent Labs  Lab 05/01/24 0903  TSH 3.030    BNP Recent Labs  Lab 05/01/24 0728  BNP 955.8*    DDimer No results for input(s): DDIMER in the last 168 hours.  Radiology/Studies:  DG Chest Portable 1 View Result Date: 05/01/2024 IMPRESSION: 1. Diffusely increased interstitial opacities, which may represent pulmonary edema or  atypical infection. 2. Similar blunting of bilateral costophrenic angles, which may represent small pleural effusions. Electronically Signed   By: Limin  Xu M.D.   On: 05/01/2024 08:25   Assessment and Plan:  Permanent atrial fibrillation Atrial fibrillation with RVR - Patient presenting with shortness of breath and feeling of heart racing ongoing since this morning - Noted to be in atrial fibrillation with rates up to 170s bpm on arrival - Given IV metoprolol  5 mg x 2 with some improvement in rate, now 120-150 bpm - Recommend increasing metoprolol  tartrate to 25 mg every 6 hours for further rate control - Consider addition of IV amiodarone  if rates remain difficult to control - Continue heparin  infusion - Repeat echo pending  NSTEMI - Patient had mild chest discomfort on 8/9 which resolved spontaneously - No further chest pain - Troponin 81399 > 14167 - EKG without acute ischemic changes - Discussed options for ischemic evaluation with patient. Given age and comorbidities, patient would prefer to avoid invasive procedures at this time.  - Will plan to focus on rate control as above at this time and discuss further pending echo results - Continue heparin  infusion  HFpEF - Most recent echo with EF 55 to 60% - BNP 955 - CXR with findings concerning for pulmonary edema and small pleural effusions - Repeat echo - Agree with IV Lasix  40 mg twice daily - GDMT on hold to allow for rate control, will plan to reintroduce as able  For questions or updates, please contact [facility] Please consult [email or web address] for contact info under    Signed, Lesley LITTIE Maffucci, PA-C  05/01/2024 12:12 PM

## 2024-05-01 NOTE — ED Notes (Signed)
 ECHO Tech at bedside and stated she would like to let pt medication work more to decrease HR and will return to perform ECHO in 1-2 hour.

## 2024-05-01 NOTE — ED Provider Notes (Signed)
 [facility] Provider Note    Event Date/Time   First MD Initiated Contact with Patient 05/01/24 [phone]     (approximate)   History   Chief Complaint Respiratory Distress   HPI  Karen Cunningham is a 89 y.o. female with past medical history of hypertension, CAD, permanent atrial fibrillation on Coumadin , HFpEF, and CKD who presents to the ED complaining of respiratory distress.  Per EMS, patient complained of feeling like her heart was racing yesterday but was fine when family went to check on her in the evening.  She then woke up this morning severely short of breath, contacted family as well as EMS.  She was found to have oxygen saturations in the mid 80s on EMS arrival, placed on CPAP with improvement and given 2 DuoNebs as well as 125 mg of IV Solu-Medrol.  Patient with difficulty providing significant history due to respiratory distress, but denies any pain, complains only of difficulty breathing.  Family ports that she has been compliant with her diuretic medication.     Physical Exam   Triage Vital Signs: ED Triage Vitals  Encounter Vitals Group     BP 05/01/24 0720 (!) 170/136     Girls Systolic BP Percentile --      Girls Diastolic BP Percentile --      Boys Systolic BP Percentile --      Boys Diastolic BP Percentile --      Pulse Rate 05/01/24 0720 (!) 166     Resp 05/01/24 0720 (!) 48     Temp --      Temp src --      SpO2 05/01/24 0720 98 %     Weight --      Height --      Head Circumference --      Peak Flow --      Pain Score 05/01/24 0721 0     Pain Loc --      Pain Education --      Exclude from Growth Chart --     Most recent vital signs: Vitals:   05/01/24 0830 05/01/24 0845  BP: (!) 135/112   Pulse: (!) 105 76  Resp: (!) 21 (!) 22  SpO2: 100% 100%    Constitutional: Alert and oriented. Eyes: Conjunctivae are normal. Head: Atraumatic. Nose: No congestion/rhinnorhea. Mouth/Throat: Mucous membranes are moist.   Cardiovascular: Tachycardic, irregularly irregular rhythm. Grossly normal heart sounds.  2+ radial pulses bilaterally. Respiratory: Tachypneic with increased work of breathing and accessory muscle use, lung sounds muffled with diffuse crackles. Gastrointestinal: Soft and nontender. No distention. Musculoskeletal: No lower extremity tenderness, 1+ pitting edema to knees bilaterally. Neurologic:  Normal speech and language. No gross focal neurologic deficits are appreciated.    ED Results / Procedures / Treatments   Labs (all labs ordered are listed, but only abnormal results are displayed) Labs Reviewed  CBC WITH DIFFERENTIAL/PLATELET - Abnormal; Notable for the following components:      Result Value   WBC 11.7 (*)    HCT 46.1 (*)    All other components within normal limits  COMPREHENSIVE METABOLIC PANEL WITH GFR - Abnormal; Notable for the following components:   Glucose, Bld 263 (*)    AST 105 (*)    ALT 47 (*)    Alkaline Phosphatase 131 (*)    GFR, Estimated 54 (*)    All other components within normal limits  BRAIN NATRIURETIC PEPTIDE - Abnormal; Notable for the following components:  B Natriuretic Peptide 955.8 (*)    All other components within normal limits  BLOOD GAS, VENOUS - Abnormal; Notable for the following components:   pH, Ven 7.16 (*)    pCO2, Ven 69 (*)    pO2, Ven 60 (*)    Acid-base deficit 5.7 (*)    All other components within normal limits  PROTIME-INR - Abnormal; Notable for the following components:   Prothrombin Time 20.6 (*)    INR 1.7 (*)    All other components within normal limits  TROPONIN I (HIGH SENSITIVITY) - Abnormal; Notable for the following components:   Troponin I (High Sensitivity) 18,600 (*)    All other components within normal limits  RESP PANEL BY RT-PCR (RSV, FLU A&B, COVID)  RVPGX2  APTT  TROPONIN I (HIGH SENSITIVITY)     EKG  ED ECG REPORT I, Carlin Palin, the attending physician, personally viewed and interpreted  this ECG.   Date: 05/01/2024  EKG Time: 7:19  Rate: 171  Rhythm: atrial fibrillation  Axis: Normal  Intervals:left anterior fascicular block  ST&T Change: None  RADIOLOGY Chest x-ray reviewed and interpreted by me with multifocal infiltrates concerning for pulmonary edema.  PROCEDURES:  Critical Care performed: Yes, see critical care procedure note(s)  .Critical Care  Performed by: Palin Carlin, MD Authorized by: Palin Carlin, MD   Critical care provider statement:    Critical care time (minutes):  30   Critical care time was exclusive of:  Separately billable procedures and treating other patients and teaching time   Critical care was necessary to treat or prevent imminent or life-threatening deterioration of the following conditions:  Respiratory failure   Critical care was time spent personally by me on the following activities:  Development of treatment plan with patient or surrogate, discussions with consultants, evaluation of patient's response to treatment, examination of patient, ordering and review of laboratory studies, ordering and review of radiographic studies, ordering and performing treatments and interventions, pulse oximetry, re-evaluation of patient's condition and review of old charts   I assumed direction of critical care for this patient from another provider in my specialty: no     Care discussed with: admitting provider      MEDICATIONS ORDERED IN ED: Medications  metoprolol  tartrate (LOPRESSOR ) injection 5 mg (has no administration in time range)  aspirin  chewable tablet 324 mg (has no administration in time range)  heparin  bolus via infusion 3,000 Units (has no administration in time range)    Followed by  heparin  ADULT infusion 100 units/mL (25000 units/250mL) (has no administration in time range)  metoprolol  tartrate (LOPRESSOR ) injection 5 mg (5 mg Intravenous Given 05/01/24 0733)  furosemide  (LASIX ) injection 40 mg (40 mg Intravenous Given  05/01/24 0803)     IMPRESSION / MDM / ASSESSMENT AND PLAN / ED COURSE  I reviewed the triage vital signs and the nursing notes.                              89 y.o. female with past medical history of hypertension, CAD, permanent atrial fibrillation on Coumadin , HFpEF, and CKD who presents to the ED complaining of palpitations and respiratory distress since waking this morning.  Patient's presentation is most consistent with acute presentation with potential threat to life or bodily function.  Differential diagnosis includes, but is not limited to, arrhythmia, ACS, PE, pneumonia, CHF exacerbation, COPD, anemia, electrolyte abnormality, AKI.  Patient ill-appearing and in moderate to severe  respiratory distress on arrival, lung sounds faint with crackles throughout.  She was transition to BiPAP and VBG shows hypercapnic respiratory failure, she is currently maintaining oxygen saturations on BiPAP.  EKG shows atrial fibrillation with RVR, appears to show some rate related ischemic changes, but no findings concerning for ACS and patient denies chest pain.  Will give IV dose of metoprolol  for rate control, also diurese with IV Lasix  as chest x-ray appears concerning for pulmonary edema.  Additional labs are pending at this time.  Labs with mild leukocytosis but no significant anemia, electrolyte abnormality, or AKI.  She does have a mild transaminitis but no abdominal pain.  VBG shows hypercapnia, mental status much improved on reassessment as patient is more alert and breathing comfortably.  She does have elevated BNP and troponin noted to be greater than 18,000.  Patient now stating that she had intermittent chest discomfort over the past 2 days, denies any ongoing chest pain at this time.  Patient given loading dose of aspirin  and we will start on IV heparin , case discussed with staff for Dr. Darron as he is currently in the cath lab, but will evaluate the patient.  Case discussed with hospitalist for  admission.      FINAL CLINICAL IMPRESSION(S) / ED DIAGNOSES   Final diagnoses:  Acute on chronic congestive heart failure, unspecified heart failure type (HCC)  Acute respiratory failure with hypoxia and hypercapnia (HCC)  NSTEMI (non-ST elevated myocardial infarction) (HCC)     Rx / DC Orders   ED Discharge Orders     None        Note:  This document was prepared using Dragon voice recognition software and may include unintentional dictation errors.   Willo Dunnings, MD 05/01/24 [phone]

## 2024-05-01 NOTE — Progress Notes (Signed)
   05/01/24 1415  Spiritual Encounters  Type of Visit Follow up  Care provided to: Pt and family  Conversation partners present during encounter Nurse  Referral source Chaplain assessment  Reason for visit Routine spiritual support  Spiritual Framework  Presenting Themes Significant life change  Interventions  Spiritual Care Interventions Made Narrative/life review;Prayer;Encouragement  Intervention Outcomes  Outcomes Reduced fear;Awareness of support;Reduced anxiety  Spiritual Care Plan  Spiritual Care Issues Still Outstanding No further spiritual care needs at this time (see row info)   Chaplain stopped back in and found pt was anxious because of shortness of breath. Chaplain held her hand and prayed for her and then sang Kathryne Fellows at pt request

## 2024-05-01 NOTE — Plan of Care (Signed)
  Problem: Coping: Goal: Ability to adjust to condition or change in health will improve Outcome: Progressing   Problem: Health Behavior/Discharge Planning: Goal: Ability to identify and utilize available resources and services will improve Outcome: Progressing   Problem: Tissue Perfusion: Goal: Adequacy of tissue perfusion will improve Outcome: Progressing   Problem: Education: Goal: Knowledge of General Education information will improve Description: Including pain rating scale, medication(s)/side effects and non-pharmacologic comfort measures Outcome: Progressing   Problem: Health Behavior/Discharge Planning: Goal: Ability to manage health-related needs will improve Outcome: Progressing   Problem: Clinical Measurements: Goal: Will remain free from infection Outcome: Progressing   Problem: Elimination: Goal: Will not experience complications related to urinary retention Outcome: Progressing   Problem: Safety: Goal: Ability to remain free from injury will improve Outcome: Progressing

## 2024-05-01 NOTE — Progress Notes (Signed)
 Echocardiogram 2D Echocardiogram has been performed.  Thea Norlander 05/01/2024, 6:41 PM

## 2024-05-01 NOTE — ED Notes (Signed)
 Cardiology PA at bedside assessing pt.

## 2024-05-01 NOTE — Progress Notes (Signed)
   05/01/24 1615  Spiritual Encounters  Type of Visit Follow up  Care provided to: Family  Spiritual Care Plan  Spiritual Care Issues Still Outstanding No further spiritual care needs at this time (see row info)

## 2024-05-01 NOTE — TOC CM/SW Note (Signed)
..  Transition of Care [facility]) - Inpatient Brief Assessment   Patient Details  Name: Karen Cunningham MRN: [id number] Date of Birth: Jan 23, 1931  Transition of [facility]) CM/SW Contact:    Edsel DELENA Fischer, LCSW Phone Number: 05/01/2024, 10:42 AM   Clinical Narrative:  SW to handoff to heart failure team to follow up with pt.  If additional needs are present, sw to address  Transition of Care Asessment:

## 2024-05-01 NOTE — Consult Note (Signed)
 PHARMACY - ANTICOAGULATION CONSULT NOTE  Pharmacy Consult for heparin  Indication: chest pain/ACS  Allergies  Allergen Reactions   Codeine Nausea And Vomiting   Nsaids     Dr instructed to avoid due to kidney issues    Ibuprofen Other (See Comments)    Dr instructed to avoid due to kidney issues    Prednisone Other (See Comments)    ELEVATED BLOOD PRESSURE.    Patient Measurements: Height: 5' (152.4 cm) Weight: 50.1 kg (110 lb 7.2 oz) IBW/kg (Calculated) : 45.5 HEPARIN  DW (KG): 50.1  Vital Signs: Temp: 97.7 F (36.5 C) (08/11 1700) Temp Source: Oral (08/11 1700) BP: 105/71 (08/11 1830) Pulse Rate: 70 (08/11 1845)  Labs: Recent Labs    05/01/24 0728 05/01/24 0903 05/01/24 1954  HGB 14.1  --   --   HCT 46.1*  --   --   PLT 235  --   --   APTT  --  29  --   LABPROT 20.6*  --   --   INR 1.7*  --   --   HEPARINUNFRC  --   --  0.43  CREATININE 0.98  --   --   CKTOTAL  --  660*  --   TROPONINIHS 18,600* 14,167*  --     Estimated Creatinine Clearance: 26.9 mL/min (by C-G formula based on SCr of 0.98 mg/dL).   Medical History: Past Medical History:  Diagnosis Date   (HFpEF) heart failure with preserved ejection fraction (HCC)    a. 07/2016 Echo: EF 51%; b. 12/2016 Echo: EF 41%. Mod TR, mild PR; c. 09/2017 Echo [facility]): EF 62%, Sev BAE. Mod TR, mild PR; d. 03/2019 Echo([facility]): EF 57%, Gr3 DD. Sev dil LA, mild dil RA. Midly dil Asc Ao. Trace AI. Mild to mod TR, mild PR. RVSP .   Anemia    Arthritis    Carotid arterial disease (HCC)    a. 10/2013 Mod to Sev RICA dzs. Mild dzs in L bulb; b. 10/2013 CTA Carotids: LICA 50p, RICA w/o stenosis.   CKD (chronic kidney disease), stage III (HCC)    Coronary artery disease    a. Several neg MV between 2008 and 2010; b. 2010 s/p PCI/DES to the mLAD; c. 10/2013 MV: Fixed apical defect, likely breast attenuation, no ischemia; d. 09/2014 MV: No ischemia; e. 03/2018 MV [facility]): no ischemia.   GERD (gastroesophageal reflux disease)     HLD (hyperlipidemia)    Hypertension    Hypothyroidism    Permanent atrial fibrillation (HCC)    a. Dx ~ 2017. CHA2DS2VASc = 6-->coumadin .    Medications:  Scheduled:   atorvastatin   40 mg Oral QHS   fluticasone   1 spray Each Nare Daily   furosemide   40 mg Intravenous BID   insulin  aspart  0-9 Units Subcutaneous TID WC   levothyroxine   88 mcg Oral Q0600   loratadine   5 mg Oral Daily   melatonin  10 mg Oral QHS   metoprolol  tartrate  25 mg Oral Q6H   pantoprazole   40 mg Oral Daily   sodium chloride  flush  3 mL Intravenous Q12H   Infusions:   sodium chloride      heparin  600 Units/hr (05/01/24 1600)   PRN:   Assessment: 89 year old female presenting with ACS/possible STEMI with troponin of 18.600 ng/L. Notable PMH permanent Afib with warfarin use, HFpEF, HTN, PVD, and hypothyroidism. CBC notable for hemoglobin of 14.1 g/dL and platelet count of 764 K/uL. Serum creatinine of 0.98 mg/dL (CrCl  27 mL/min) at baseline.   Notable fill history for warfarin 2 mg daily with current INR of 1.7. Will use actual body weight of 50 kg for heparin  dosing since patient is non-obese.   8/11 1954 HL 0.43.   Goal of Therapy:  Heparin  level 0.3-0.7 units/ml Monitor platelets by anticoagulation protocol: Yes   Plan:  Heparin  level is therapeutic. Will continue heparin  infusion at 600 units/hr. Recheck heparin  level and CBC in 8 hours.   Cathaleen GORMAN Blanch - PharmD  05/01/2024,8:35 PM

## 2024-05-01 NOTE — ED Notes (Signed)
 Pt placed on NRB for transport and respiratory to assist with transport to ICU

## 2024-05-01 NOTE — Progress Notes (Signed)
 HR not controled since morning, despite multiple lopressor  push and PO metoprolol . One dose of 0.125 mg digoxin  ordered.

## 2024-05-01 NOTE — ED Triage Notes (Signed)
 Pt comes in via [facility] in respiratory distress. Pt complained of heart palpitations yesterday. Pt woke up this morning unable to breath. Pt has a history of AFIB, and heart failure. Pt arrived on CPAP, and was transferred over to bipap with respiratory. MD Jessup at the bedside. Pt is alert and oriented x4.

## 2024-05-01 NOTE — Progress Notes (Signed)
 Attempted Echocardiogram, waiting for HR to come down. Will try a little later.

## 2024-05-01 NOTE — Consult Note (Signed)
 PHARMACY - ANTICOAGULATION CONSULT NOTE  Pharmacy Consult for heparin  initiation  Indication: chest pain/ACS  Allergies  Allergen Reactions   Codeine Nausea And Vomiting   Nsaids     Dr instructed to avoid due to kidney issues    Ibuprofen Other (See Comments)    Dr instructed to avoid due to kidney issues    Prednisone Other (See Comments)    ELEVATED BLOOD PRESSURE.    Patient Measurements: Height: 5' (152.4 cm) Weight: 50.1 kg (110 lb 7.2 oz) IBW/kg (Calculated) : 45.5 HEPARIN  DW (KG): 50.1  Vital Signs: BP: 135/112 (08/11 0830) Pulse Rate: 76 (08/11 0845)  Labs: Recent Labs    05/01/24 0728  HGB 14.1  HCT 46.1*  PLT 235  LABPROT 20.6*  INR 1.7*  CREATININE 0.98  TROPONINIHS 18,600*    Estimated Creatinine Clearance: 26.9 mL/min (by C-G formula based on SCr of 0.98 mg/dL).   Medical History: Past Medical History:  Diagnosis Date   (HFpEF) heart failure with preserved ejection fraction (HCC)    a. 07/2016 Echo: EF 51%; b. 12/2016 Echo: EF 41%. Mod TR, mild PR; c. 09/2017 Echo [facility]): EF 62%, Sev BAE. Mod TR, mild PR; d. 03/2019 Echo([facility]): EF 57%, Gr3 DD. Sev dil LA, mild dil RA. Midly dil Asc Ao. Trace AI. Mild to mod TR, mild PR. RVSP .   Anemia    Arthritis    Carotid arterial disease (HCC)    a. 10/2013 Mod to Sev RICA dzs. Mild dzs in L bulb; b. 10/2013 CTA Carotids: LICA 50p, RICA w/o stenosis.   CKD (chronic kidney disease), stage III (HCC)    Coronary artery disease    a. Several neg MV between 2008 and 2010; b. 2010 s/p PCI/DES to the mLAD; c. 10/2013 MV: Fixed apical defect, likely breast attenuation, no ischemia; d. 09/2014 MV: No ischemia; e. 03/2018 MV [facility]): no ischemia.   GERD (gastroesophageal reflux disease)    HLD (hyperlipidemia)    Hypertension    Hypothyroidism    Permanent atrial fibrillation (HCC)    a. Dx ~ 2017. CHA2DS2VASc = 6-->coumadin .    Medications:  Scheduled:   aspirin   324 mg Oral Once   metoprolol  tartrate  5 mg  Intravenous Once   Infusions:  PRN:   Assessment: 89 year old female presenting with ACS/possible STEMI with troponin of 18.600 ng/L. Notable PMH permanent Afib with warfarin use, HFpEF, HTN, PVD, and hypothyroidism. CBC notable for hemoglobin of 14.1 g/dL and platelet count of 764 K/uL. Serum creatinine of 0.98 mg/dL (CrCl 27 mL/min) at baseline.   Notable fill history for warfarin 2 mg daily with current INR of 1.7. Will use actual body weight of 50 kg for heparin  dosing since patient is non-obese.   Goal of Therapy:  Heparin  level 0.3-0.7 units/ml Monitor platelets by anticoagulation protocol: Yes   Plan:  Give 3,000 units bolus x 1 Start heparin  infusion at 600 units/hr Hold warfarin in setting of heparin  use.  Monitor heparin  level in 8 hours, CBC daily for hemoglobin and platelet count, and INR daily.   Zona Pedro [place] - PharmD Candidate 05/01/2024,9:24 AM

## 2024-05-01 NOTE — H&P (Addendum)
 History and Physical    SKILYNN Cunningham FMW:[id number] DOB: 29-Nov-1930 DOA: 05/01/2024  PCP: Fernand Fredy RAMAN, MD (Confirm with patient/family/NH records and if not entered, this has to be entered at [facility] point of entry) Patient coming from: Home  I have personally briefly reviewed patient's old medical records in [facility]  Chief Complaint: Chest pain, SOB  HPI: Karen Cunningham is a 89 y.o. female with medical history significant of chronic A-fib on Coumadin , HFpEF, CKD stage III b, CAD status post stenting in 2008 in 2010, hypothyroidism, Presented with new onset chest pain and shortness of breath and palpitations.  Symptoms started yesterday, patient started complaining about intermittent pressure-like chest pain, associated with shortness of breath and malaise and belly bloating worsening with activity, overnight patient started develop severe shortness of breath and could not lie flat on the bed, sitting and walking around to find a comfortable position.  Denies any fever chills no dysuria no diarrhea or nauseous vomiting.  She occasionally feels palpitations only in certain body position.  No headache or vision changes.  Family called EMS this morning EMS found patient heart rate in 170s and hypoxic and placed patient on CPAP. ED Course: Afebrile, heart rate in the 130-150s, blood pressure 130/80 O2 saturation 96% on BiPAP.  Chest x-ray showed pulmonary edema, ABG 7.1 6/69/60, WBC 11.7 hemoglobin 14.1 BUN 19 creatinine 0.9 glucose 297 AST 105 ALT 45, bilirubin 0.9, troponin 1 8,000> 14,000   Patient was started on heparin  drip, IV Lasix  and metoprolol  IV push  Review of Systems: As per HPI otherwise 14 point review of systems negative.    Past Medical History:  Diagnosis Date   (HFpEF) heart failure with preserved ejection fraction (HCC)    a. 07/2016 Echo: EF 51%; b. 12/2016 Echo: EF 41%. Mod TR, mild PR; c. 09/2017 Echo [facility]): EF 62%, Sev BAE. Mod TR, mild PR; d. 03/2019  Echo([facility]): EF 57%, Gr3 DD. Sev dil LA, mild dil RA. Midly dil Asc Ao. Trace AI. Mild to mod TR, mild PR. RVSP .   Anemia    Arthritis    Carotid arterial disease (HCC)    a. 10/2013 Mod to Sev RICA dzs. Mild dzs in L bulb; b. 10/2013 CTA Carotids: LICA 50p, RICA w/o stenosis.   CKD (chronic kidney disease), stage III (HCC)    Coronary artery disease    a. Several neg MV between 2008 and 2010; b. 2010 s/p PCI/DES to the mLAD; c. 10/2013 MV: Fixed apical defect, likely breast attenuation, no ischemia; d. 09/2014 MV: No ischemia; e. 03/2018 MV [facility]): no ischemia.   GERD (gastroesophageal reflux disease)    HLD (hyperlipidemia)    Hypertension    Hypothyroidism    Permanent atrial fibrillation (HCC)    a. Dx ~ 2017. CHA2DS2VASc = 6-->coumadin .    Past Surgical History:  Procedure Laterality Date   ABDOMINAL HYSTERECTOMY     APPENDECTOMY     BACK SURGERY     CARDIAC CATHETERIZATION     x1 stent;[facility]   CORONARY STENT PLACEMENT     JOINT REPLACEMENT Left    hip   JOINT REPLACEMENT Bilateral    knees   NISSEN FUNDOPLICATION     THORACOTOMY     TOTAL HIP ARTHROPLASTY Right 03/09/2018   Procedure: TOTAL HIP ARTHROPLASTY;  Surgeon: Mardee Lynwood SQUIBB, MD;  Location: [facility] ORS;  Service: Orthopedics;  Laterality: Right;     reports that she has never smoked. She has never used smokeless  tobacco. She reports that she does not drink alcohol and does not use drugs.  Allergies  Allergen Reactions   Codeine Nausea And Vomiting   Nsaids     Dr instructed to avoid due to kidney issues    Ibuprofen Other (See Comments)    Dr instructed to avoid due to kidney issues    Prednisone Other (See Comments)    ELEVATED BLOOD PRESSURE.    Family History  Problem Relation Age of Onset   Heart Problems Mother    Heart attack Mother    AAA (abdominal aortic aneurysm) Mother    Hypertension Mother    Heart Problems Father      Prior to Admission medications   Medication Sig Start Date End Date  Taking? Authorizing Provider  atorvastatin  (LIPITOR) 40 MG tablet TAKE 1 TABLET BY MOUTH AT BEDTIME 11/18/23  Yes End, Lonni, MD  B Complex Vitamins (VITAMIN B COMPLEX PO) Take by mouth.   Yes [provider]  Cholecalciferol  (VITAMIN D3) 1000 UNITS CAPS Take 1,000 Units by mouth daily.    Yes [provider]  diltiazem  (DILT-XR) 240 MG 24 hr capsule Take 1 capsule by mouth once daily 11/18/23  Yes End, Lonni, MD  Ferrous Sulfate  (IRON) 325 (65 FE) MG TABS Take 325 mg by mouth daily.    Yes [provider]  fluticasone  (FLONASE ) 50 MCG/ACT nasal spray Place 1 spray into both nostrils daily. 04/03/24 04/03/25 Yes Fernand Fredy RAMAN, MD  furosemide  (LASIX ) 20 MG tablet Take 1 tablet (20 mg total) by mouth daily as needed (swelling or weight gain of 2 lbs overnight or 5 lbs in a week). 04/12/24  Yes End, Lonni, MD  levothyroxine  (SYNTHROID ) 88 MCG tablet Take 1 tablet (88 mcg total) by mouth every morning. 09/06/23  Yes Fernand Fredy RAMAN, MD  Loratadine  10 MG CAPS Take 1 capsule (10 mg total) by mouth daily. 04/03/24  Yes Fernand Fredy RAMAN, MD  losartan  (COZAAR ) 50 MG tablet Take 1/2 (one-half) tablet by mouth once daily 05/07/23  Yes End, Lonni, MD  Melatonin 10 MG TABS Take 10 mg by mouth at bedtime.   Yes [provider]  metoprolol  tartrate (LOPRESSOR ) 50 MG tablet Take 1 tablet (50 mg total) by mouth 2 (two) times daily. 04/19/24  Yes End, Lonni, MD  omeprazole  (PRILOSEC) 20 MG capsule Take 1 capsule (20 mg total) by mouth daily. 09/06/23  Yes Fernand Fredy RAMAN, MD  warfarin (COUMADIN ) 2 MG tablet TAKE 1 TABLET BY MOUTH IN THE EVENING OR AS DIRECTED BY ANTICOAGULATION CLINIC 02/18/24  Yes End, Lonni, MD  acetaminophen  (TYLENOL ) 500 MG tablet Take 1 tablet (500 mg total) by mouth every 6 (six) hours as needed for moderate pain (pain). 09/29/18   Josette Ade, MD    Physical Exam: Vitals:   05/01/24 0805 05/01/24 0815 05/01/24 0830 05/01/24 0845   BP:   (!) 135/112   Pulse:  85 (!) 105 76  Resp: (!) 25 14 (!) 21 (!) 22  SpO2:  100% 100% 100%  Weight:      Height:        Constitutional: NAD, calm, comfortable Vitals:   05/01/24 0805 05/01/24 0815 05/01/24 0830 05/01/24 0845  BP:   (!) 135/112   Pulse:  85 (!) 105 76  Resp: (!) 25 14 (!) 21 (!) 22  SpO2:  100% 100% 100%  Weight:      Height:       Eyes: PERRL, lids and conjunctivae normal  ENMT: Mucous membranes are moist. Posterior pharynx clear of any exudate or lesions.Normal dentition.  Neck: normal, supple, no masses, no thyromegaly Respiratory: clear to auscultation bilaterally, no wheezing, bilateral fine crackles, increasing respiratory effort. No accessory muscle use.  Cardiovascular: Irregular heart rate, no murmurs / rubs / gallops. No extremity edema. 2+ pedal pulses. No carotid bruits.  Abdomen: no tenderness, no masses palpated. No hepatosplenomegaly. Bowel sounds positive.  Musculoskeletal: no clubbing / cyanosis. No joint deformity upper and lower extremities. Good ROM, no contractures. Normal muscle tone.  Skin: no rashes, lesions, ulcers. No induration Neurologic: CN 2-12 grossly intact. Sensation intact, DTR normal. Strength 5/5 in all 4.  Psychiatric: Normal judgment and insight. Alert and oriented x 3. Normal mood.     Labs on Admission: I have personally reviewed following labs and imaging studies  CBC: Recent Labs  Lab 05/01/24 0728  WBC 11.7*  NEUTROABS 6.8  HGB 14.1  HCT 46.1*  MCV 93.7  PLT 235   Basic Metabolic Panel: Recent Labs  Lab 05/01/24 0728  NA 144  K 3.6  CL 109  CO2 24  GLUCOSE 263*  BUN 19  CREATININE 0.98  CALCIUM  9.1   GFR: Estimated Creatinine Clearance: 26.9 mL/min (by C-G formula based on SCr of 0.98 mg/dL). Liver Function Tests: Recent Labs  Lab 05/01/24 0728  AST 105*  ALT 47*  ALKPHOS 131*  BILITOT 0.9  PROT 8.0  ALBUMIN 4.1   No results for input(s): LIPASE, AMYLASE in the last 168  hours. No results for input(s): AMMONIA in the last 168 hours. Coagulation Profile: Recent Labs  Lab 04/26/24 1119 05/01/24 0728  INR 2.2 1.7*   Cardiac Enzymes: No results for input(s): CKTOTAL, CKMB, CKMBINDEX, TROPONINI in the last 168 hours. BNP (last 3 results) No results for input(s): PROBNP in the last 8760 hours. HbA1C: No results for input(s): HGBA1C in the last 72 hours. CBG: No results for input(s): GLUCAP in the last 168 hours. Lipid Profile: No results for input(s): CHOL, HDL, LDLCALC, TRIG, CHOLHDL, LDLDIRECT in the last 72 hours. Thyroid  Function Tests: No results for input(s): TSH, T4TOTAL, FREET4, T3FREE, THYROIDAB in the last 72 hours. Anemia Panel: No results for input(s): VITAMINB12, FOLATE, FERRITIN, TIBC, IRON, RETICCTPCT in the last 72 hours. Urine analysis:    Component Value Date/Time   COLORURINE STRAW (A) 02/25/2018 1015   APPEARANCEUR CLEAR (A) 02/25/2018 1015   APPEARANCEUR Clear 04/10/2013 1414   LABSPEC 1.009 02/25/2018 1015   LABSPEC 1.015 04/10/2013 1414   PHURINE 6.0 02/25/2018 1015   GLUCOSEU 50 (A) 02/25/2018 1015   GLUCOSEU >=500 04/10/2013 1414   HGBUR NEGATIVE 02/25/2018 1015   BILIRUBINUR NEGATIVE 02/25/2018 1015   BILIRUBINUR Negative 04/10/2013 1414   KETONESUR NEGATIVE 02/25/2018 1015   PROTEINUR NEGATIVE 02/25/2018 1015   NITRITE NEGATIVE 02/25/2018 1015   LEUKOCYTESUR NEGATIVE 02/25/2018 1015   LEUKOCYTESUR Negative 04/10/2013 1414    Radiological Exams on Admission: DG Chest Portable 1 View Result Date: 05/01/2024 CLINICAL DATA:  Shortness of breath EXAM: PORTABLE CHEST 1 VIEW COMPARISON:  Chest radiograph dated 12/02/2022 FINDINGS: Normal lung volumes. Diffusely increased interstitial opacities. Similar blunting of bilateral costophrenic angles. No pneumothorax. Similar enlarged cardiomediastinal silhouette. No acute osseous abnormality. IMPRESSION: 1. Diffusely increased  interstitial opacities, which may represent pulmonary edema or atypical infection. 2. Similar blunting of bilateral costophrenic angles, which may represent small pleural effusions. Electronically Signed   By: Limin  Xu M.D.   On: 05/01/2024 08:25    EKG: Independently reviewed.  A-fib with RVR, no acute ST changes.  Assessment/Plan Principal Problem:   NSTEMI (non-ST elevated myocardial infarction) (HCC) Active Problems:   A-fib (HCC)  (please populate well all problems here in Problem List. (For example, if patient is on BP meds at home and you resume or decide to hold them, it is a problem that needs to be her. Same for CAD, COPD, HLD and so on)  NSTEMI - Troponin appears to have reached plateau - Continue ACS medication including aspirin , heparin  drip, beta-blocker and ARB and statin - Cardiology consulted this morning, told patient and family n.p.o. for now in case emergency LHC needed. - Echocardiogram - Discussed with patient and daughter/POA at bedside, both patient and family desire full code.  Acute on chronic HFpEF decompensation Acute hypoxic and hypercapnic respiratory failure - Secondary to NSTEMI and uncontrolled A-fib - Continue diuresis Lasix  40 mg twice daily -Admit to stepdown unit, wean off BiPAP - Rate control as below - Echocardiogram - Check TSH  A-fib with RVR - Given there is a heart attack ongoing, digoxin  contraindicated - Hold off Cardizem  due to new onset of decompensated CHF - Continue metoprolol  twice daily with as needed metoprolol  -P.o. KCl to make K> 4.0, check magnesium  and phosphorus level - On heparin  drip for ACS, hold off Coumadin   Acute transaminitis - Clinically suspect liver congestion secondary to new onset of CHF with decompensation - Trend LFTs  HTN - As above  Hyperglycemia - No history of diabetes, - SSI for now check A1c  Deconditioning - Postpone PT evaluation to tomorrow  Total critical care time spent on patient care  60 minutes  DVT prophylaxis: Heparin  drip Code Status: Full code Family Communication: Daughter at bedside Disposition Plan: Patient is sick with NSTEMI and new onset of CHF with hypoxia requiring BiPAP support, and inpatient cardiology workup, expect more than 2 midnight hospital stay Consults called: Cardiology Admission status: Stepdown unit admit  Cort ONEIDA Mana MD [facility] Pager [phone]  05/01/2024, 9:53 AM

## 2024-05-01 NOTE — Progress Notes (Signed)
   05/01/24 1030  Spiritual Encounters  Type of Visit Initial  Care provided to: Pt and family  Referral source Chaplain assessment  Reason for visit Routine spiritual support  Spiritual Framework  Presenting Themes Meaning/purpose/sources of inspiration  Spiritual Care Plan  Spiritual Care Issues Still Outstanding No further spiritual care needs at this time (see row info)   Pt's daughter-in-law, who met chaplain in the hallway, asked chaplain to come meet her mother-in-law who is the pt. Spent time listening to the pt and meeting the rest of the family.

## 2024-05-02 ENCOUNTER — Inpatient Hospital Stay

## 2024-05-02 DIAGNOSIS — Z7189 Other specified counseling: Secondary | ICD-10-CM | POA: Diagnosis not present

## 2024-05-02 DIAGNOSIS — J9691 Respiratory failure, unspecified with hypoxia: Secondary | ICD-10-CM | POA: Diagnosis not present

## 2024-05-02 DIAGNOSIS — J9601 Acute respiratory failure with hypoxia: Secondary | ICD-10-CM | POA: Diagnosis not present

## 2024-05-02 DIAGNOSIS — R739 Hyperglycemia, unspecified: Secondary | ICD-10-CM

## 2024-05-02 DIAGNOSIS — R7401 Elevation of levels of liver transaminase levels: Secondary | ICD-10-CM

## 2024-05-02 DIAGNOSIS — E785 Hyperlipidemia, unspecified: Secondary | ICD-10-CM | POA: Insufficient documentation

## 2024-05-02 DIAGNOSIS — I5023 Acute on chronic systolic (congestive) heart failure: Secondary | ICD-10-CM | POA: Insufficient documentation

## 2024-05-02 DIAGNOSIS — Z515 Encounter for palliative care: Secondary | ICD-10-CM | POA: Diagnosis not present

## 2024-05-02 DIAGNOSIS — I5022 Chronic systolic (congestive) heart failure: Secondary | ICD-10-CM | POA: Insufficient documentation

## 2024-05-02 DIAGNOSIS — N1831 Chronic kidney disease, stage 3a: Secondary | ICD-10-CM

## 2024-05-02 DIAGNOSIS — I4891 Unspecified atrial fibrillation: Secondary | ICD-10-CM | POA: Diagnosis not present

## 2024-05-02 DIAGNOSIS — I482 Chronic atrial fibrillation, unspecified: Secondary | ICD-10-CM | POA: Diagnosis not present

## 2024-05-02 DIAGNOSIS — I5021 Acute systolic (congestive) heart failure: Secondary | ICD-10-CM

## 2024-05-02 DIAGNOSIS — E039 Hypothyroidism, unspecified: Secondary | ICD-10-CM

## 2024-05-02 DIAGNOSIS — I214 Non-ST elevation (NSTEMI) myocardial infarction: Secondary | ICD-10-CM | POA: Diagnosis not present

## 2024-05-02 DIAGNOSIS — N183 Chronic kidney disease, stage 3 unspecified: Secondary | ICD-10-CM

## 2024-05-02 DIAGNOSIS — K219 Gastro-esophageal reflux disease without esophagitis: Secondary | ICD-10-CM

## 2024-05-02 DIAGNOSIS — I10 Essential (primary) hypertension: Secondary | ICD-10-CM

## 2024-05-02 LAB — BASIC METABOLIC PANEL WITH GFR
Anion gap: 14 (ref 5–15)
BUN: 28 mg/dL — ABNORMAL HIGH (ref 8–23)
CO2: 22 mmol/L (ref 22–32)
Calcium: 9.4 mg/dL (ref 8.9–10.3)
Chloride: 106 mmol/L (ref 98–111)
Creatinine, Ser: 0.93 mg/dL (ref 0.44–1.00)
GFR, Estimated: 58 mL/min — ABNORMAL LOW (ref >60)
Glucose, Bld: 141 mg/dL — ABNORMAL HIGH (ref 70–99)
Potassium: 4.4 mmol/L (ref 3.5–5.1)
Sodium: 142 mmol/L (ref 135–145)

## 2024-05-02 LAB — HEPATIC FUNCTION PANEL
ALT: 41 U/L (ref 0–44)
AST: 97 U/L — ABNORMAL HIGH (ref 15–41)
Albumin: 3.5 g/dL (ref 3.5–5.0)
Alkaline Phosphatase: 96 U/L (ref 38–126)
Bilirubin, Direct: 0.1 mg/dL (ref 0.0–0.2)
Indirect Bilirubin: 1.1 mg/dL — ABNORMAL HIGH (ref 0.3–0.9)
Total Bilirubin: 1.2 mg/dL (ref 0.0–1.2)
Total Protein: 7.1 g/dL (ref 6.5–8.1)

## 2024-05-02 LAB — CBC
HCT: 40.7 % (ref 36.0–46.0)
Hemoglobin: 12.9 g/dL (ref 12.0–15.0)
MCH: 28.7 pg (ref 26.0–34.0)
MCHC: 31.7 g/dL (ref 30.0–36.0)
MCV: 90.4 fL (ref 80.0–100.0)
Platelets: 197 K/uL (ref 150–400)
RBC: 4.5 MIL/uL (ref 3.87–5.11)
RDW: 15.2 % (ref 11.5–15.5)
WBC: 12.6 K/uL — ABNORMAL HIGH (ref 4.0–10.5)
nRBC: 0 % (ref 0.0–0.2)

## 2024-05-02 LAB — GLUCOSE, CAPILLARY
Glucose-Capillary: 112 mg/dL — ABNORMAL HIGH (ref 70–99)
Glucose-Capillary: 149 mg/dL — ABNORMAL HIGH (ref 70–99)
Glucose-Capillary: 119 mg/dL — ABNORMAL HIGH (ref 70–99)
Glucose-Capillary: 129 mg/dL — ABNORMAL HIGH (ref 70–99)
Glucose-Capillary: 140 mg/dL — ABNORMAL HIGH (ref 70–99)
Glucose-Capillary: 144 mg/dL — ABNORMAL HIGH (ref 70–99)

## 2024-05-02 LAB — ECHOCARDIOGRAM COMPLETE
AR max vel: 0.72 cm2
AV Area VTI: 0.55 cm2
AV Area mean vel: 0.66 cm2
AV Mean grad: 11 mmHg
AV Peak grad: 19.4 mmHg
Ao pk vel: 2.21 m/s
Area-P 1/2: 6.65 cm2
Height: 60 in
MV M vel: 4.14 m/s
MV Peak grad: 68.6 mmHg
MV VTI: 0.68 cm2
S' Lateral: 3 cm
Weight: 1767.21 [oz_av]

## 2024-05-02 LAB — BLOOD GAS, VENOUS
Acid-Base Excess: 1.4 mmol/L (ref 0.0–2.0)
Bicarbonate: 24.9 mmol/L (ref 20.0–28.0)
O2 Saturation: 93.3 %
Patient temperature: 37
pCO2, Ven: 35 mmHg — ABNORMAL LOW (ref 44–60)
pH, Ven: 7.46 — ABNORMAL HIGH (ref 7.25–7.43)
pO2, Ven: 67 mmHg — ABNORMAL HIGH (ref 32–45)

## 2024-05-02 LAB — HEMOGLOBIN A1C
Hgb A1c MFr Bld: 6.2 % — ABNORMAL HIGH (ref 4.8–5.6)
Mean Plasma Glucose: 131 mg/dL

## 2024-05-02 LAB — TROPONIN I (HIGH SENSITIVITY): Troponin I (High Sensitivity): 11310 ng/L (ref <18)

## 2024-05-02 LAB — HEPARIN LEVEL (UNFRACTIONATED): Heparin Unfractionated: 0.47 [IU]/mL (ref 0.30–0.70)

## 2024-05-02 LAB — PROTIME-INR
INR: 1.6 — ABNORMAL HIGH (ref 0.8–1.2)
Prothrombin Time: 20.3 s — ABNORMAL HIGH (ref 11.4–15.2)

## 2024-05-02 MED ORDER — ALPRAZOLAM 0.25 MG PO TABS
0.2500 mg | ORAL_TABLET | Freq: Once | ORAL | Status: AC
  Administered 2024-05-02 (×2): 0.25 mg via ORAL
  Filled 2024-05-02: qty 1

## 2024-05-02 MED ORDER — GLUCERNA SHAKE PO LIQD
237.0000 mL | Freq: Three times a day (TID) | ORAL | Status: DC
  Administered 2024-05-02 – 2024-05-06 (×16): 237 mL via ORAL

## 2024-05-02 MED ORDER — MELATONIN 5 MG PO TABS: 10.0000 mg | ORAL_TABLET | Freq: Every day | ORAL | Status: DC

## 2024-05-02 MED ORDER — DIGOXIN 0.25 MG/ML IJ SOLN
0.1250 mg | Freq: Once | INTRAMUSCULAR | Status: AC
  Administered 2024-05-02 (×2): 0.125 mg via INTRAVENOUS
  Filled 2024-05-02: qty 2

## 2024-05-02 MED ORDER — HYDROXYZINE HCL 10 MG PO TABS
10.0000 mg | ORAL_TABLET | Freq: Three times a day (TID) | ORAL | Status: DC | PRN
  Administered 2024-05-02 (×2): 10 mg via ORAL
  Filled 2024-05-02 (×3): qty 1

## 2024-05-02 MED ORDER — CHLORHEXIDINE GLUCONATE CLOTH 2 % EX PADS
6.0000 | MEDICATED_PAD | Freq: Every day | CUTANEOUS | Status: DC
  Administered 2024-05-02 – 2024-05-05 (×6): 6 via TOPICAL

## 2024-05-02 MED ORDER — AMIODARONE HCL IN DEXTROSE 360-4.14 MG/200ML-% IV SOLN
60.0000 mg/h | INTRAVENOUS | Status: AC
  Administered 2024-05-02 (×4): 60 mg/h via INTRAVENOUS
  Filled 2024-05-02 (×2): qty 200

## 2024-05-02 MED ORDER — ALPRAZOLAM 0.25 MG PO TABS
0.2500 mg | ORAL_TABLET | Freq: Three times a day (TID) | ORAL | Status: DC | PRN
  Administered 2024-05-02 – 2024-05-05 (×5): 0.25 mg via ORAL
  Filled 2024-05-02 (×4): qty 1

## 2024-05-02 MED ORDER — AMIODARONE HCL IN DEXTROSE 360-4.14 MG/200ML-% IV SOLN
30.0000 mg/h | INTRAVENOUS | Status: DC
  Administered 2024-05-02 – 2024-05-04 (×7): 30 mg/h via INTRAVENOUS
  Filled 2024-05-02 (×3): qty 200

## 2024-05-02 NOTE — Assessment & Plan Note (Signed)
 Chest pain improved, troponin peaked at 18,600. Echocardiogram with regional wall motion abnormalities-consistent with NSTEMI. Family does not want any invasive procedure. - Cardiology is on board -Continue with heparin  infusion -Continue with medical management

## 2024-05-02 NOTE — Assessment & Plan Note (Signed)
 TSH normal. -Continue with home Synthroid 

## 2024-05-02 NOTE — Progress Notes (Signed)
 Rounding Note   Patient Name: Karen Cunningham Date of Encounter: 05/02/2024  [facility] Cardiologist: [name], MD   Subjective Remains in atrial fibrillation with rates difficult to control. Echo yesterday showed newly reduced LV systolic function with EF 25-30%. She remains volume overloaded. She is requiring BiPAP at time of exam, although she reports breathing seems to be improving. She denies chest pain and palpitations.   Scheduled Meds:  atorvastatin   40 mg Oral QHS   Chlorhexidine  Gluconate Cloth  6 each Topical Daily   fluticasone   1 spray Each Nare Daily   furosemide   40 mg Intravenous BID   insulin  aspart  0-9 Units Subcutaneous TID WC   levothyroxine   88 mcg Oral Q0600   loratadine   5 mg Oral Daily   melatonin  10 mg Oral QHS   metoprolol  tartrate  25 mg Oral Q6H   pantoprazole   40 mg Oral Daily   sodium chloride  flush  3 mL Intravenous Q12H   Continuous Infusions:  amiodarone  60 mg/hr (05/02/24 0903)   amiodarone      heparin  600 Units/hr (05/02/24 0900)   PRN Meds: acetaminophen , ondansetron  (ZOFRAN ) IV, sodium chloride  flush   Vital Signs  Vitals:   05/02/24 0600 05/02/24 0700 05/02/24 0800 05/02/24 0825  BP: (!) 129/109 (!) 137/97    Pulse: (!) 58 79    Resp: (!) 25 (!) 24    Temp:   97.6 F (36.4 C)   TempSrc:   Axillary   SpO2: 100% 100%  100%  Weight:      Height:        Intake/Output Summary (Last 24 hours) at 05/02/2024 1025 Last data filed at 05/02/2024 0900 Gross per 24 hour  Intake 167.63 ml  Output --  Net 167.63 ml      05/02/2024    5:00 AM 05/01/2024    7:22 AM 04/12/2024   10:10 AM  Last 3 Weights  Weight (lbs) 132 lb 0.9 oz 110 lb 7.2 oz 127 lb 12.8 oz  Weight (kg) 59.9 kg 50.1 kg 57.97 kg      Telemetry Atrial fibrillation with rate 100-150 bpm - Personally Reviewed  Physical Exam  GEN: No acute distress.   Neck: No JVD Cardiac: tachycardic, IRIR, no murmurs, rubs, or gallops.  Respiratory: Reduced  throughout with bibasilar crackles GI: Soft, nontender, non-distended  MS: No edema; No deformity. Neuro:  Nonfocal  Psych: Normal affect   Labs High Sensitivity Troponin:   Recent Labs  Lab 05/01/24 0728 05/01/24 0903 05/02/24 0621  TROPONINIHS 18,600* 14,167* 11,310*     Chemistry Recent Labs  Lab 05/01/24 0728 05/01/24 0903 05/02/24 0339  NA 144  --  142  K 3.6  --  4.4  CL 109  --  106  CO2 24  --  22  GLUCOSE 263*  --  141*  BUN 19  --  28*  CREATININE 0.98  --  0.93  CALCIUM  9.1  --  9.4  MG  --  2.0  --   PROT 8.0  --  7.1  ALBUMIN 4.1  --  3.5  AST 105*  --  97*  ALT 47*  --  41  ALKPHOS 131*  --  96  BILITOT 0.9  --  1.2  GFRNONAA 54*  --  58*  ANIONGAP 11  --  14    Lipids No results for input(s): CHOL, TRIG, HDL, LABVLDL, LDLCALC, CHOLHDL in the last 168 hours.  Hematology Recent Labs  Lab 05/01/24 0728 05/02/24 0339  WBC 11.7* 12.6*  RBC 4.92 4.50  HGB 14.1 12.9  HCT 46.1* 40.7  MCV 93.7 90.4  MCH 28.7 28.7  MCHC 30.6 31.7  RDW 15.2 15.2  PLT 235 197   Thyroid   Recent Labs  Lab 05/01/24 0903  TSH 3.030    BNP Recent Labs  Lab 05/01/24 0728  BNP 955.8*    DDimer No results for input(s): DDIMER in the last 168 hours.   Radiology  DG Chest 1 View Result Date: 05/02/2024 IMPRESSION: Minimal to mild interstitial densities concerning for possible pulmonary edema. Small pleural effusions. Electronically Signed   By: Lynwood Landy Raddle M.D.   On: 05/02/2024 08:32   DG Chest Portable 1 View Result Date: 05/01/2024 IMPRESSION: 1. Diffusely increased interstitial opacities, which may represent pulmonary edema or atypical infection. 2. Similar blunting of bilateral costophrenic angles, which may represent small pleural effusions. Electronically Signed   By: Limin  Xu M.D.   On: 05/01/2024 08:25    Cardiac Studies  05/01/2024 Echo complete 1. Left ventricular ejection fraction, by estimation, is 25 to 30%. The  left ventricle  has severely decreased function. The left ventricle  demonstrates regional wall motion abnormalities (see scoring  diagram/findings for description). Left ventricular  diastolic function could not be evaluated.   2. Right ventricular systolic function is moderately reduced. The right  ventricular size is normal. There is normal pulmonary artery systolic  pressure.   3. Left atrial size was moderately dilated.   4. Right atrial size was severely dilated.   5. Moderate pleural effusion in the left lateral region.   6. The mitral valve is degenerative. Mild to moderate mitral valve  regurgitation. Severe mitral annular calcification.   7. Tricuspid valve regurgitation is mild to moderate.   8. The aortic valve has an indeterminant number of cusps. There is mild  calcification of the aortic valve. There is moderate thickening of the  aortic valve. Aortic valve regurgitation is not visualized. Moderate to  severe low-flow/low-gradient aortic  valve stenosis. Aortic valve area, by VTI measures 0.55 cm. Aortic valve  mean gradient measures 11.0 mmHg. DI 0.24.   9. The inferior vena cava is normal in size with <50% respiratory  variability, suggesting right atrial pressure of 8 mmHg.   Patient Profile   89 y.o. female  with a hx of CAD s/p PCI to the mid LAD in 2010, chronic HFpEF, permanent atrial fibrillation on Coumadin , carotid artery disease, mild aortic valve stenosis, hypertension, hyperlipidemia, CKD, hypothyroidism, and chronic lower extremity edema who was admitted 8/11 with dyspnea, who we are seeing for the ongoing management of atrial fibrillation with RVR, NSTEMI, and newly reduced LV systolic function.   Assessment & Plan   Permanent atrial fibrillation  Atrial fibrillation with RVR - Patient presenting with shortness of breath and feeling of heart racing ongoing since this morning - Noted to be in atrial fibrillation with rates up to 170s bpm on arrival - Received IV digoxin   0.125 mg x 1 yesterday - Rates remain difficult to control - Will start IV amiodarone  and give another dose of digoxin  - Continue metoprolol  25 mg every 6 hours - Continue heparin  infusion  NSTEMI - Patient had mild chest discomfort on 8/9 which resolved spontaneously - No further chest pain - Troponin 81399 > 1416 > 11310 - EKG without acute ischemic changes - Echo shows newly reduced LV function with EF 25-30% and regional wall motion abnormalities, detailed below -  Again discussed possible cardiac catheterization. Will plan to focus on rate control and diuresis and revisit this discussion once patient is more stable.  - Continue heparin  infusion - Recommend palliative care consultation   Heart failure with newly reduced EF - Prior echo with EF 55-60% - Echo this admission shows EF 25-30% - BNP 955 - CXR with findings concerning for pulmonary edema and small pleural effusions - Continue IV Lasix  40 mg twice daily - GDMT on hold to allow for rate control, will plan to introduce as able  Acute hypoxic respiratory failure - Likely due to volume overload secondary to acute heart failure and atrial fibrillation with RVR - Wean BiPAP as able   For questions or updates, please contact [facility] Please consult [email or web address] for contact info under     Signed, Lesley LITTIE Maffucci, PA-C  05/02/2024, 10:25 AM

## 2024-05-02 NOTE — Assessment & Plan Note (Signed)
 Heart rate remained uncontrolled despite being on amiodarone  infusion.  Received digoxin  x 2. -Continue with amiodarone  infusion -Continue with metoprolol  -Continue to monitor

## 2024-05-02 NOTE — Consult Note (Signed)
 PHARMACY - ANTICOAGULATION CONSULT NOTE  Pharmacy Consult for heparin  Indication: chest pain/ACS  Allergies  Allergen Reactions   Codeine Nausea And Vomiting   Nsaids     Dr instructed to avoid due to kidney issues    Ibuprofen Other (See Comments)    Dr instructed to avoid due to kidney issues    Prednisone Other (See Comments)    ELEVATED BLOOD PRESSURE.    Patient Measurements: Height: 5' (152.4 cm) Weight: 50.1 kg (110 lb 7.2 oz) IBW/kg (Calculated) : 45.5 HEPARIN  DW (KG): 50.1  Vital Signs: Temp: 97.7 F (36.5 C) (08/11 1700) Temp Source: Oral (08/11 1700) BP: 128/82 (08/12 0300) Pulse Rate: 61 (08/12 0300)  Labs: Recent Labs    05/01/24 0728 05/01/24 0903 05/01/24 1954 05/02/24 0339  HGB 14.1  --   --  12.9  HCT 46.1*  --   --  40.7  PLT 235  --   --  197  APTT  --  29  --   --   LABPROT 20.6*  --   --  20.3*  INR 1.7*  --   --  1.6*  HEPARINUNFRC  --   --  0.43 0.47  CREATININE 0.98  --   --  0.93  CKTOTAL  --  660*  --   --   TROPONINIHS 18,600* 14,167*  --   --     Estimated Creatinine Clearance: 28.3 mL/min (by C-G formula based on SCr of 0.93 mg/dL).   Medical History: Past Medical History:  Diagnosis Date   (HFpEF) heart failure with preserved ejection fraction (HCC)    a. 07/2016 Echo: EF 51%; b. 12/2016 Echo: EF 41%. Mod TR, mild PR; c. 09/2017 Echo [facility]): EF 62%, Sev BAE. Mod TR, mild PR; d. 03/2019 Echo([facility]): EF 57%, Gr3 DD. Sev dil LA, mild dil RA. Midly dil Asc Ao. Trace AI. Mild to mod TR, mild PR. RVSP .   Anemia    Arthritis    Carotid arterial disease (HCC)    a. 10/2013 Mod to Sev RICA dzs. Mild dzs in L bulb; b. 10/2013 CTA Carotids: LICA 50p, RICA w/o stenosis.   CKD (chronic kidney disease), stage III (HCC)    Coronary artery disease    a. Several neg MV between 2008 and 2010; b. 2010 s/p PCI/DES to the mLAD; c. 10/2013 MV: Fixed apical defect, likely breast attenuation, no ischemia; d. 09/2014 MV: No ischemia; e. 03/2018 MV  [facility]): no ischemia.   GERD (gastroesophageal reflux disease)    HLD (hyperlipidemia)    Hypertension    Hypothyroidism    Permanent atrial fibrillation (HCC)    a. Dx ~ 2017. CHA2DS2VASc = 6-->coumadin .    Medications:  Scheduled:   atorvastatin   40 mg Oral QHS   fluticasone   1 spray Each Nare Daily   furosemide   40 mg Intravenous BID   insulin  aspart  0-9 Units Subcutaneous TID WC   levothyroxine   88 mcg Oral Q0600   loratadine   5 mg Oral Daily   melatonin  10 mg Oral QHS   metoprolol  tartrate  25 mg Oral Q6H   pantoprazole   40 mg Oral Daily   sodium chloride  flush  3 mL Intravenous Q12H   Infusions:   sodium chloride      heparin  600 Units/hr (05/01/24 1600)   PRN:   Assessment: 89 year old female presenting with ACS/possible STEMI with troponin of 18.600 ng/L. Notable PMH permanent Afib with warfarin use, HFpEF, HTN, PVD, and hypothyroidism. CBC  notable for hemoglobin of 14.1 g/dL and platelet count of 764 K/uL. Serum creatinine of 0.98 mg/dL (CrCl 27 mL/min) at baseline.   Notable fill history for warfarin 2 mg daily with current INR of 1.7. Will use actual body weight of 50 kg for heparin  dosing since patient is non-obese.   8/11 1954 HL 0.43 8/12 0339 HL 0.47, therapeutic X 2   Goal of Therapy:  Heparin  level 0.3-0.7 units/ml Monitor platelets by anticoagulation protocol: Yes   Plan:  8/12:  HL @ 0339 = 0.47, therapeutic X 2  - will continue pt on current rate and recheck HL on 8/13 with AM labs.  - CBC daily   Daryn Pisani D - PharmD  05/02/2024,4:29 AM
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
## 2024-05-02 NOTE — Plan of Care (Signed)
   Problem: Education: Goal: Ability to describe self-care measures that may prevent or decrease complications (Diabetes Survival Skills Education) will improve Outcome: Progressing   Problem: Coping: Goal: Ability to adjust to condition or change in health will improve Outcome: Progressing   Problem: Education: Goal: Knowledge of General Education information will improve Description: Including pain rating scale, medication(s)/side effects and non-pharmacologic comfort measures Outcome: Progressing   Problem: Clinical Measurements: Goal: Respiratory complications will improve Outcome: Progressing

## 2024-05-02 NOTE — Progress Notes (Signed)
 OT Cancellation Note  Patient Details Name: Karen Cunningham MRN: [id number] DOB: Jan 11, 1931   Cancelled Treatment:    Reason Eval/Treat Not Completed: Patient not medically ready (Pt with resting HR ranging from 140s-150s bpm in bed. Not approrpiate for mobility at this time. Discussed with bedside nurse, will hold at this time, OT will follow up as able.[name], OTD OTR/L  05/02/24, 9:41 AM

## 2024-05-02 NOTE — Progress Notes (Signed)
   05/02/24 1020  Spiritual Encounters  Type of Visit Follow up  Care provided to: Pt and family  Conversation partners present during encounter Nurse  Referral source Chaplain assessment  Reason for visit Routine spiritual support  OnCall Visit Yes (Pt asked nurse to page Elia Harvey since chaplain had seen her in the ED the day before)  Spiritual Framework  Presenting Themes Meaning/purpose/sources of inspiration  Interventions  Spiritual Care Interventions Made Prayer   Pt had ICU nurse page Elia Harvey as she had met her in the ED the day before and the pt wanted prayer and a song.

## 2024-05-02 NOTE — Progress Notes (Signed)
 Progress Note   [name] FMW:[id number] DOB: March 31, 1931 DOA: 05/01/2024     1 DOS: the patient was seen and examined on 05/02/2024   Brief hospital course: Taken from prior notes.  Karen Cunningham is a 89 y.o. female with medical history significant of chronic A-fib on Coumadin , HFpEF, CKD stage III b, CAD status post stenting in 2008 in 2010, hypothyroidism, Presented with new onset chest pain and shortness of breath and palpitations.  On presentation patient was found to be in A-fib with RVR, heart rate up to 170, hypoxic and she was placed on BiPAP for increased work of breathing.  Chest x-ray with pulmonary edema, mild leukocytosis and transaminitis.  T. bili above 18,000.  Cardiology was consulted and patient was started on heparin  infusion, received IV Lasix  and metoprolol  pushes.  As heart rate remained uncontrolled patient was started on amiodarone  infusion and received 2 doses of digoxin . Cardiology was suggesting cardiac cath after improving respiratory status but family would like to do noninvasive management only  8/12: Patient was able to wean to 10 L of oxygen from BiPAP but remained tachycardic and tachypneic, heart rate remained uncontrolled and mostly in 150s, labs with slight worsening of leukocytosis at 12.6, troponin peaked at 18,600. Echocardiogram with new diagnosis of HFrEF, EF of 25 to 30%, did show regional wall motion abnormalities.  Also shows moderately reduced RV function and normal pulmonary arterial pressure, moderate to severely dilated both atrium, moderate pleural effusion on left, mild to moderate MR, moderate to severe AS.   Palliative care was also consulted to discuss goals of care, patient with poor prognosis.  Assessment and Plan: * NSTEMI (non-ST elevated myocardial infarction) (HCC) Chest pain improved, troponin peaked at 18,600. Echocardiogram with regional wall motion abnormalities-consistent with NSTEMI. Family does not want any  invasive procedure. - Cardiology is on board -Continue with heparin  infusion -Continue with medical management  Acute heart failure with reduced ejection fraction (HFrEF, <= 40%) (HCC) Likely secondary to NSTEMI, echocardiogram with EF of 25 to 30% with regional wall motion abnormalities, biventricular failure and biatrial dilatation with moderate MR, TR and moderate to severe AS. EF was normal a month ago and it was mild MR, TR and AS. BNP elevated and chest x-ray with concern of pulmonary vascular congestion. - Continue with IV Lasix  -Daily weight and BMP -Strict intake and output  Chronic atrial fibrillation with RVR (HCC) Heart rate remained uncontrolled despite being on amiodarone  infusion.  Received digoxin  x 2. -Continue with amiodarone  infusion -Continue with metoprolol  -Continue to monitor   Acute hypoxic respiratory failure (HCC) Initially requiring BiPAP, VBG with concern of hypoxia and hypercapnia.  Repeat VBG with improvement in hypercapnia and pH.  No baseline oxygen use, currently on 10 L of oxygen and remained tachypneic with increased work of breathing. -Continue supplemental oxygen-might need to be placed on BiPAP again but currently patient is little resistant  Transaminitis Likely secondary to hepatic congestion with acute heart failure. - Continue to monitor  CKD (chronic kidney disease), stage III (HCC) Renal function currently around baseline.  History of CKD stage III A.  - Monitor renal function while patient is being diuresed -Avoid nephrotoxins  Hypertension Continue with metoprolol  and Lasix   Hypothyroidism TSH normal. - Continue with home Synthroid   Hyperglycemia No prior diagnosis of diabetes, A1c of 6.2. - Continue to monitor  Dyslipidemia - Continue with statin  GERD (gastroesophageal reflux disease) - Continue with PPI   Subjective: Patient was seen and examined today.  Still quite tachycardic and tachypneic, denies any more chest  pain.  On 10 L of oxygen.  Physical Exam: Vitals:   05/02/24 1130 05/02/24 1145 05/02/24 1200 05/02/24 1215  BP: (!) 148/111   (!) 129/91  Pulse: (!) 120 93 69 (!) 43  Resp: (!) 21 (!) 31 (!) 40 (!) 29  Temp:    97.7 F (36.5 C)  TempSrc:    Axillary  SpO2: 93% 96% 99% 95%  Weight:      Height:       General.  Frail and ill-appearing elderly lady,  Pulmonary.few scattered crackles bilaterally, tachypneic with increased work of breathing CV.  Irregularly irregular, tachycardic Abdomen.  Soft, nontender, nondistended, BS positive. CNS.  Alert and oriented .  No focal neurologic deficit. Extremities.  No edema,  pulses intact and symmetrical.  Data Reviewed: Prior data reviewed  Family Communication: DIL at bedside  Disposition: Status is: Inpatient Remains inpatient appropriate because: Severity of illness  Planned Discharge Destination: To be determined  DVT prophylaxis.  Heparin  infusion Time spent: 55 minutes  This record has been created using Conservation officer, historic buildings. Errors have been sought and corrected,but may not always be located. Such creation errors do not reflect on the standard of care.   Author: Amaryllis Dare, MD 05/02/2024 12:46 PM  For on call review [email or web address].

## 2024-05-02 NOTE — Assessment & Plan Note (Signed)
-   Continue with statin

## 2024-05-02 NOTE — Assessment & Plan Note (Signed)
 Likely secondary to hepatic congestion with acute heart failure. - Continue to monitor

## 2024-05-02 NOTE — Progress Notes (Signed)
 PT Cancellation Note  Patient Details Name: KALLEE NAM MRN: [id number] DOB: Sep 15, 1931   Cancelled Treatment:    Reason Eval/Treat Not Completed: Medical issues which prohibited therapy (Attempted in the morning and again in the afternoon. Discussed with RN and OT. Heart rate remains elevated. PT will hold and follow up tomorrow or as appropriate.)  Randine Essex, PT, MPT  Randine LULLA Essex 05/02/2024, 1:52 PM

## 2024-05-02 NOTE — Consult Note (Signed)
 Consultation Note Date: 05/02/2024   Patient Name: Karen Cunningham  DOB: [birth year]  MRN: [id number]  Age / Sex: 89 y.o., female  PCP: Karen Fredy RAMAN, MD Referring Physician: Caleen Qualia, MD  Reason for Consultation: Establishing goals of care  HPI/Patient Profile: 89 y.o. female  with past medical history of HFpEF, chronic Afib on warfarin, CKD stage 3b, CAD s/p stent, hypothyroidism admitted on 05/01/2024 with chest pain and shortness of breath with NSTEMI, declined EF, unstable Afib, respiratory distress.    Clinical Assessment and Goals of Care: Consult received and chart review completed. I discussed with RN. I met initially with daughter-in-law, Karen Cunningham, as Karen Cunningham visited with her pastor. Karen Cunningham shares with me previous discussions and wishes regarding goals of care. Questions came up about decisions and resuscitation that has made them think and question their initial decisions. We were able to speak more about resuscitation and what this path would look like and lead to. We also discussed poor prognosis knowing significant heart disease. We discussed poor prognosis with CPR as well as poor prognosis and suffering with intubation. They plan to continue conversation to clarify wishes. We plan to meet together with son this afternoon.   I spent time with Karen Cunningham and Karen Cunningham and assisting with management of anxiety and discussion with Karen Cunningham. Time spent for support of Karen Cunningham as well as Karen Cunningham.  Update: I met today with son Karen Cunningham and Karen Cunningham while Karen Cunningham rested. They have had time to discuss together, with Karen Cunningham, and with Karen Cunningham who they know and trust. We had a long discussion regarding declined EF with weakened heart muscle which is likely making heart rate control more difficult in the setting of heart attack. We discussed path forward along with expectations. Ultimately they have determined that  Karen Cunningham would not desire CPR, shock, or intubation - DNR/DNI ordered. They wish to continue care with optimizing medication and support. We discussed with a little time we will see small improvements towards stabilization or gradual decline with weakness, fatigue and more symptom burden. We reviewed reconsideration of our plan of care if signs of suffering and lack of improvement. We discussed ongoing trial of BiPAP if needed and if she became BiPAP dependent how we would provide medication for comfort to ease transition off BiPAP at end of life. Family is hopeful for improvement but prepared for decline knowing that Karen Cunningham has much to look forward to in heaven if it is her time.   All questions/concerns addressed. Emotional support provided especially to family struggling with decisions.   Primary Decision Maker PATIENT    SUMMARY OF RECOMMENDATIONS   - DNR/DNI decided - Continue care with symptom management - Ongoing goals of case discussions and support  Code Status/Advance Care Planning: DNR   Symptom Management:  PRN Xanax  for anxiety  Prognosis:  Prognosis poor.   Discharge Planning: To Be Determined      Primary Diagnoses: Present on Admission:  A-fib [facility])   I have reviewed the medical  record, interviewed the patient and family, and examined the patient. The following aspects are pertinent.  Past Medical History:  Diagnosis Date   (HFpEF) heart failure with preserved ejection fraction (HCC)    a. 07/2016 Echo: EF 51%; b. 12/2016 Echo: EF 41%. Mod TR, mild PR; c. 09/2017 Echo Karen Cunningham): EF 62%, Sev BAE. Mod TR, mild PR; d. 03/2019 Echo(Karen Cunningham): EF 57%, Gr3 DD. Sev dil LA, mild dil RA. Midly dil Asc Ao. Trace AI. Mild to mod TR, mild PR. RVSP .   Anemia    Arthritis    Carotid arterial disease (HCC)    a. 10/2013 Mod to Sev RICA dzs. Mild dzs in L bulb; b. 10/2013 CTA Carotids: LICA 50p, RICA w/o stenosis.   CKD (chronic kidney disease), stage III (HCC)    Coronary  artery disease    a. Several neg MV between 2008 and 2010; b. 2010 s/p PCI/DES to the mLAD; c. 10/2013 MV: Fixed apical defect, likely breast attenuation, no ischemia; d. 09/2014 MV: No ischemia; e. 03/2018 MV Karen Cunningham): no ischemia.   GERD (gastroesophageal reflux disease)    HLD (hyperlipidemia)    Hypertension    Hypothyroidism    Permanent atrial fibrillation (HCC)    a. Dx ~ 2017. CHA2DS2VASc = 6-->coumadin .   Social History   Socioeconomic History   Marital status: Widowed    Spouse name: Not on file   Number of children: Not on file   Years of education: Not on file   Highest education level: Not on file  Occupational History   Not on file  Tobacco Use   Smoking status: Never   Smokeless tobacco: Never  Vaping Use   Vaping status: Never Used  Substance and Sexual Activity   Alcohol use: Never   Drug use: Never   Sexual activity: Not on file  Other Topics Concern   Not on file  Social History Narrative   Not on file   Social Drivers of Health   Financial Resource Strain: Not on file  Food Insecurity: No Food Insecurity (05/01/2024)   Hunger Vital Sign    Worried About Running Out of Food in the Last Year: Never true    Ran Out of Food in the Last Year: Never true  Transportation Needs: No Transportation Needs (05/01/2024)   PRAPARE - Administrator, Civil Service (Medical): No    Lack of Transportation (Non-Medical): No  Physical Activity: Not on file  Stress: Not on file  Social Connections: Socially Isolated (05/01/2024)   Social Connection and Isolation Panel    Frequency of Communication with Friends and Family: More than three times a week    Frequency of Social Gatherings with Friends and Family: More than three times a week    Attends Religious Services: Never    Database administrator or Organizations: No    Attends Banker Meetings: Never    Marital Status: Widowed   Family History  Problem Relation Age of Onset   Heart Problems  Mother    Heart attack Mother    AAA (abdominal aortic aneurysm) Mother    Hypertension Mother    Heart Problems Father    Scheduled Meds:  atorvastatin   40 mg Oral QHS   Chlorhexidine  Gluconate Cloth  6 each Topical Daily   fluticasone   1 spray Each Nare Daily   furosemide   40 mg Intravenous BID   insulin  aspart  0-9 Units Subcutaneous TID WC   levothyroxine   88 mcg  Oral Q0600   loratadine   5 mg Oral Daily   melatonin  10 mg Oral QHS   metoprolol  tartrate  25 mg Oral Q6H   pantoprazole   40 mg Oral Daily   sodium chloride  flush  3 mL Intravenous Q12H   Continuous Infusions:  amiodarone  60 mg/hr (05/02/24 0903)   amiodarone      heparin  600 Units/hr (05/02/24 0900)   PRN Meds:.acetaminophen , ondansetron  (ZOFRAN ) IV, sodium chloride  flush Allergies  Allergen Reactions   Codeine Nausea And Vomiting   Nsaids     Dr instructed to avoid due to kidney issues    Ibuprofen Other (See Comments)    Dr instructed to avoid due to kidney issues    Prednisone Other (See Comments)    ELEVATED BLOOD PRESSURE.   Review of Systems  Constitutional:  Positive for activity change and appetite change.  Respiratory:         Intermittent shortness of breath  Psychiatric/Behavioral:  The patient is nervous/anxious.     Physical Exam Vitals and nursing note reviewed.  Constitutional:      Appearance: She is ill-appearing.  Cardiovascular:     Rate and Rhythm: Tachycardia present. Rhythm irregularly irregular.     Comments: 120-180s Pulmonary:     Comments: Shortness of breath intermittently Neurological:     Mental Status: She is alert and oriented to person, place, and time.     Vital Signs: BP (!) 137/97   Pulse 79   Temp 97.6 F (36.4 C) (Axillary)   Resp (!) 24   Ht 5' (1.524 m)   Wt 59.9 kg   LMP  (LMP Unknown)   SpO2 100%   BMI 25.79 kg/m  Pain Scale: 0-10   Pain Score: 0-No pain   SpO2: SpO2: 100 % O2 Device:SpO2: 100 % O2 Flow Rate: .O2 Flow Rate (L/min): 10  L/min  IO: Intake/output summary:  Intake/Output Summary (Last 24 hours) at 05/02/2024 1016 Last data filed at 05/02/2024 0900 Gross per 24 hour  Intake 167.63 ml  Output --  Net 167.63 ml    LBM: Last BM Date : 05/01/24 Baseline Weight: Weight: 50.1 kg Most recent weight: Weight: 59.9 kg     Palliative Assessment/Data:    Time Total: 120 min  Greater than 50%  of this time was spent counseling and coordinating care related to the above assessment and plan.  Signed by: Bernarda Kitty, NP Palliative Medicine Team Pager # [phone] (M-F 8a-5p) Team Phone # [phone] (Nights/Weekends)

## 2024-05-02 NOTE — Assessment & Plan Note (Signed)
 Continue with PPI

## 2024-05-02 NOTE — Assessment & Plan Note (Signed)
 Continue with metoprolol  and Lasix 

## 2024-05-02 NOTE — Hospital Course (Addendum)
 Taken from prior notes.  Karen Cunningham is a 89 y.o. female with medical history significant of chronic A-fib on Coumadin , HFpEF, CKD stage III b, CAD status post stenting in 2008 in 2010, hypothyroidism, Presented with new onset chest pain and shortness of breath and palpitations.  On presentation patient was found to be in A-fib with RVR, heart rate up to 170, hypoxic and she was placed on BiPAP for increased work of breathing.  Chest x-ray with pulmonary edema, mild leukocytosis and transaminitis.  T. bili above 18,000.  Cardiology was consulted and patient was started on heparin  infusion, received IV Lasix  and metoprolol  pushes.  As heart rate remained uncontrolled patient was started on amiodarone  infusion and received 2 doses of digoxin . Cardiology was suggesting cardiac cath after improving respiratory status but family would like to do noninvasive management only  8/12: Patient was able to wean to 10 L of oxygen from BiPAP but remained tachycardic and tachypneic, heart rate remained uncontrolled and mostly in 150s, labs with slight worsening of leukocytosis at 12.6, troponin peaked at 18,600. Echocardiogram with new diagnosis of HFrEF, EF of 25 to 30%, did show regional wall motion abnormalities.  Also shows moderately reduced RV function and normal pulmonary arterial pressure, moderate to severely dilated both atrium, moderate pleural effusion on left, mild to moderate MR, moderate to severe AS.   Palliative care was also consulted to discuss goals of care, patient with poor prognosis.  CODE STATUS changed to DNR and to continue current level of care.  8/13: Currently stable but continued to need 10 L of oxygen, heart rate now in the low 100s.  Worsening leukocytosis likely reactive secondary to NSTEMI, slight increase in BUN to 39 and creatinine to 1.01.  Continuing current management.  Digoxin  was added by cardiology.  Family would like to continue current level of care without any  escalation at this time and they will decide in next couple of days depending on which direction she will go.  8/14: Hemodynamic stable and improving oxygen requirement, currently on 2 L of oxygen.  PT is recommending SNF-patient wants to go home, family has not final decision regarding disposition.  If remains stable likely can be discharged tomorrow. Heparin  infusion was discontinued and patient is now back on warfarin, amiodarone  infusion has been switched with p.o.  8/15: Clinically stable, heart rate variable and mostly remained in the low 100s, family is now leaning more towards home with hospice.  8/16: Remained hemodynamically stable, on room air.  Heart rate seems controlled.  Cardiology discontinue amiodarone  and increase the dose of metoprolol .  She will continue with digoxin . Patient will discuss with hospice physician regarding the use of Coumadin  versus Eliquis.  Patient is being discharged home with hospice with comfort focused care.  She will continue current medications and follow-up with her providers for further assistance.

## 2024-05-02 NOTE — Assessment & Plan Note (Signed)
 No prior diagnosis of diabetes, A1c of 6.2. - Continue to monitor

## 2024-05-02 NOTE — Assessment & Plan Note (Signed)
 Initially requiring BiPAP, VBG with concern of hypoxia and hypercapnia.  Repeat VBG with improvement in hypercapnia and pH.  No baseline oxygen use, currently on 10 L of oxygen and remained tachypneic with increased work of breathing. -Continue supplemental oxygen-might need to be placed on BiPAP again but currently patient is little resistant

## 2024-05-02 NOTE — Assessment & Plan Note (Signed)
 Renal function currently around baseline.  History of CKD stage III A.  - Monitor renal function while patient is being diuresed -Avoid nephrotoxins

## 2024-05-02 NOTE — Assessment & Plan Note (Signed)
 Likely secondary to NSTEMI, echocardiogram with EF of 25 to 30% with regional wall motion abnormalities, biventricular failure and biatrial dilatation with moderate MR, TR and moderate to severe AS. EF was normal a month ago and it was mild MR, TR and AS. BNP elevated and chest x-ray with concern of pulmonary vascular congestion. - Continue with IV Lasix  -Daily weight and BMP -Strict intake and output

## 2024-05-03 ENCOUNTER — Telehealth: Payer: Self-pay | Admitting: Internal Medicine

## 2024-05-03 DIAGNOSIS — I509 Heart failure, unspecified: Secondary | ICD-10-CM

## 2024-05-03 DIAGNOSIS — J9601 Acute respiratory failure with hypoxia: Secondary | ICD-10-CM

## 2024-05-03 DIAGNOSIS — Z7189 Other specified counseling: Secondary | ICD-10-CM | POA: Diagnosis not present

## 2024-05-03 DIAGNOSIS — Z515 Encounter for palliative care: Secondary | ICD-10-CM

## 2024-05-03 DIAGNOSIS — I4821 Permanent atrial fibrillation: Secondary | ICD-10-CM | POA: Diagnosis not present

## 2024-05-03 DIAGNOSIS — I214 Non-ST elevation (NSTEMI) myocardial infarction: Secondary | ICD-10-CM | POA: Diagnosis not present

## 2024-05-03 LAB — BASIC METABOLIC PANEL WITH GFR
Anion gap: 13 (ref 5–15)
BUN: 39 mg/dL — ABNORMAL HIGH (ref 8–23)
CO2: 26 mmol/L (ref 22–32)
Calcium: 9 mg/dL (ref 8.9–10.3)
Chloride: 100 mmol/L (ref 98–111)
Creatinine, Ser: 1.01 mg/dL — ABNORMAL HIGH (ref 0.44–1.00)
GFR, Estimated: 53 mL/min — ABNORMAL LOW (ref >60)
Glucose, Bld: 127 mg/dL — ABNORMAL HIGH (ref 70–99)
Potassium: 4.2 mmol/L (ref 3.5–5.1)
Sodium: 139 mmol/L (ref 135–145)

## 2024-05-03 LAB — CBC
HCT: 44.7 % (ref 36.0–46.0)
Hemoglobin: 13.7 g/dL (ref 12.0–15.0)
MCH: 28.4 pg (ref 26.0–34.0)
MCHC: 30.6 g/dL (ref 30.0–36.0)
MCV: 92.5 fL (ref 80.0–100.0)
Platelets: 203 K/uL (ref 150–400)
RBC: 4.83 MIL/uL (ref 3.87–5.11)
RDW: 15.4 % (ref 11.5–15.5)
WBC: 15.3 K/uL — ABNORMAL HIGH (ref 4.0–10.5)
nRBC: 0 % (ref 0.0–0.2)

## 2024-05-03 LAB — HEPARIN LEVEL (UNFRACTIONATED)
Heparin Unfractionated: 0.11 [IU]/mL — ABNORMAL LOW (ref 0.30–0.70)
Heparin Unfractionated: 0.43 [IU]/mL (ref 0.30–0.70)
Heparin Unfractionated: 0.3 [IU]/mL (ref 0.30–0.70)

## 2024-05-03 LAB — GLUCOSE, CAPILLARY
Glucose-Capillary: 115 mg/dL — ABNORMAL HIGH (ref 70–99)
Glucose-Capillary: 103 mg/dL — ABNORMAL HIGH (ref 70–99)
Glucose-Capillary: 184 mg/dL — ABNORMAL HIGH (ref 70–99)
Glucose-Capillary: 88 mg/dL (ref 70–99)

## 2024-05-03 MED ORDER — METOPROLOL TARTRATE 50 MG PO TABS
50.0000 mg | ORAL_TABLET | Freq: Two times a day (BID) | ORAL | Status: DC
  Administered 2024-05-03 – 2024-05-06 (×9): 50 mg via ORAL
  Filled 2024-05-03 (×7): qty 1

## 2024-05-03 MED ORDER — HEPARIN BOLUS VIA INFUSION
1500.0000 [IU] | Freq: Once | INTRAVENOUS | Status: AC
  Administered 2024-05-03 (×2): 1500 [IU] via INTRAVENOUS
  Filled 2024-05-03: qty 1500

## 2024-05-03 MED ORDER — DIGOXIN 0.25 MG/ML IJ SOLN
0.1250 mg | Freq: Every day | INTRAMUSCULAR | Status: DC
  Administered 2024-05-03 (×2): 0.125 mg via INTRAVENOUS
  Filled 2024-05-03: qty 2

## 2024-05-03 NOTE — Assessment & Plan Note (Addendum)
 Likely secondary to NSTEMI, echocardiogram with EF of 25 to 30% with regional wall motion abnormalities, biventricular failure and biatrial dilatation with moderate MR, TR and moderate to severe AS. EF was normal a month ago and it was mild MR, TR and AS. BNP elevated and chest x-ray with concern of pulmonary vascular congestion. - IV Lasix  switched with p.o. -Daily weight and BMP -Strict intake and output -Daily weight and BMP -Strict intake and output

## 2024-05-03 NOTE — Telephone Encounter (Signed)
 Patient's daughter stopped by and wanted DR. End to know she is in ICU. Just as a FYI

## 2024-05-03 NOTE — Assessment & Plan Note (Addendum)
 Heart rate slowly improving mostly in high 90s -Continue with digoxin  -Amiodarone  infusion has been switched with p.o. amiodarone  400 mg twice daily -Continue with metoprolol  -Patient was restarted on home Coumadin  with Lovenox  bridge -Continue to monitor

## 2024-05-03 NOTE — Progress Notes (Signed)
 Heart Failure Nurse Navigator Progress Note  PCP: Fernand Fredy RAMAN, MD PCP-Cardiologist: [name], MD Admission Diagnosis: Acute on chronic congestive heart failure, unspecified heart failure type (HCC) Acute respiratory failure with hypoxia and hypercapnia (HCC) NSTEMI (Non-ST elevated myocardial infarction) [facility]) Admitted from: Home  HF interview and education with patient and Daughter-In-Law @ the bedside.  Presentation:   Karen Cunningham presented with respiratory distress.  Per EMS felt like her heart was racing the day before but was fine when her family checked on her that night.  The next morning she was severely short of breath, and contacted her family and EMS.  EMS found her O2 sats in the mid 80's and put on CPAP with improvement and given 2 DuoNebs  and Solu-Medrol.  Patient is compliant with her medications. Hx: Hypertension, CAD, permanent Atrial fibrillation on Coumadin , HFpEF and CKD.  BP 170/136, Pulse 166. BNP 955.  HS-Troponin-18,600. Chest x-ray: Minimal to mild interstitial densities concerning for possible pulmonary edema. Small pleural effusions.  ECHO/ LVEF: 25-30%-05/01/2024  Clinical Course:  Past Medical History:  Diagnosis Date   (HFpEF) heart failure with preserved ejection fraction (HCC)    a. 07/2016 Echo: EF 51%; b. 12/2016 Echo: EF 41%. Mod TR, mild PR; c. 09/2017 Echo [place]): EF 62%, Sev BAE. Mod TR, mild PR; d. 03/2019 Echo([place]): EF 57%, Gr3 DD. Sev dil LA, mild dil RA. Midly dil Asc Ao. Trace AI. Mild to mod TR, mild PR. RVSP .   Anemia    Arthritis    Carotid arterial disease (HCC)    a. 10/2013 Mod to Sev RICA dzs. Mild dzs in L bulb; b. 10/2013 CTA Carotids: LICA 50p, RICA w/o stenosis.   CKD (chronic kidney disease), stage III (HCC)    Coronary artery disease    a. Several neg MV between 2008 and 2010; b. 2010 s/p PCI/DES to the mLAD; c. 10/2013 MV: Fixed apical defect, likely breast attenuation, no ischemia; d. 09/2014 MV: No ischemia; e.  03/2018 MV [place]): no ischemia.   GERD (gastroesophageal reflux disease)    HLD (hyperlipidemia)    Hypertension    Hypothyroidism    Permanent atrial fibrillation (HCC)    a. Dx ~ 2017. CHA2DS2VASc = 6-->coumadin .     Social History   Socioeconomic History   Marital status: Widowed    Spouse name: Not on file   Number of children: Not on file   Years of education: Not on file   Highest education level: Not on file  Occupational History   Not on file  Tobacco Use   Smoking status: Never   Smokeless tobacco: Never  Vaping Use   Vaping status: Never Used  Substance and Sexual Activity   Alcohol use: Never   Drug use: Never   Sexual activity: Not on file  Other Topics Concern   Not on file  Social History Narrative   Not on file   Social Drivers of Health   Financial Resource Strain: Not on file  Food Insecurity: No Food Insecurity (05/01/2024)   Hunger Vital Sign    Worried About Running Out of Food in the Last Year: Never true    Ran Out of Food in the Last Year: Never true  Transportation Needs: No Transportation Needs (05/01/2024)   PRAPARE - Administrator, Civil Service (Medical): No    Lack of Transportation (Non-Medical): No  Physical Activity: Not on file  Stress: Not on file  Social Connections: Socially Isolated (05/01/2024)  Social Connection and Isolation Panel    Frequency of Communication with Friends and Family: More than three times a week    Frequency of Social Gatherings with Friends and Family: More than three times a week    Attends Religious Services: Never    Database administrator or Organizations: No    Attends Banker Meetings: Never    Marital Status: Widowed   Education Assessment and Provision:  Detailed education and instructions provided on heart failure disease management including the following:  Signs and symptoms of Heart Failure When to call the physician Importance of daily weights Low sodium  diet Fluid restriction Medication management Anticipated future follow-up appointments  Patient education given on each of the above topics.  Patient acknowledges understanding via teach back method and acceptance of all instructions.  Education Materials:  Living Better With Heart Failure Booklet, HF zone tool, & Daily Weight Tracker Tool.  Patient has scale at home: Yes Patient has pill box at home: Yes    High Risk Criteria for Readmission and/or Poor Patient Outcomes: Heart failure hospital admissions (last 6 months): 1  No Show rate: 1% Difficult social situation: None Demonstrates medication adherence: Yes Primary Language: English Literacy level: Reading, Writing & Comprehension  Barriers of Care:   None  Considerations/Referrals:   Referral made to Heart Failure Pharmacist Stewardship: No Referral made to Heart Failure CSW/NCM TOC: No Referral made to Heart & Vascular TOC clinic: Yes. HF Clinic [facility] 05/12/24 @ 11:30.  Items for Follow-up on DC/TOC: Diet & Fluid Restrictions Daily Weights Continued Heart Failure Education  Charmaine Pines, RN, BSN [facility] Heart Failure Navigator Secure Chat Only

## 2024-05-03 NOTE — Care Management Important Message (Signed)
 Important Message  Patient Details  Name: Karen Cunningham MRN: [id number] Date of Birth: 1931-07-16   Important Message Given:  Yes - Medicare IM     [name] 05/03/2024, 1:45 PM

## 2024-05-03 NOTE — Progress Notes (Signed)
 Progress Note   [name] FMW:[id number] DOB: 18-May-1931 DOA: 05/01/2024     2 DOS: the patient was seen and examined on 05/03/2024   Brief hospital course: Taken from prior notes.  SANELA EVOLA is a 89 y.o. female with medical history significant of chronic A-fib on Coumadin , HFpEF, CKD stage III b, CAD status post stenting in 2008 in 2010, hypothyroidism, Presented with new onset chest pain and shortness of breath and palpitations.  On presentation patient was found to be in A-fib with RVR, heart rate up to 170, hypoxic and she was placed on BiPAP for increased work of breathing.  Chest x-ray with pulmonary edema, mild leukocytosis and transaminitis.  T. bili above 18,000.  Cardiology was consulted and patient was started on heparin  infusion, received IV Lasix  and metoprolol  pushes.  As heart rate remained uncontrolled patient was started on amiodarone  infusion and received 2 doses of digoxin . Cardiology was suggesting cardiac cath after improving respiratory status but family would like to do noninvasive management only  8/12: Patient was able to wean to 10 L of oxygen from BiPAP but remained tachycardic and tachypneic, heart rate remained uncontrolled and mostly in 150s, labs with slight worsening of leukocytosis at 12.6, troponin peaked at 18,600. Echocardiogram with new diagnosis of HFrEF, EF of 25 to 30%, did show regional wall motion abnormalities.  Also shows moderately reduced RV function and normal pulmonary arterial pressure, moderate to severely dilated both atrium, moderate pleural effusion on left, mild to moderate MR, moderate to severe AS.   Palliative care was also consulted to discuss goals of care, patient with poor prognosis.  CODE STATUS changed to DNR and to continue current level of care.  8/13: Currently stable but continued to need 10 L of oxygen, heart rate now in the low 100s.  Worsening leukocytosis likely reactive secondary to NSTEMI, slight  increase in BUN to 39 and creatinine to 1.01.  Continuing current management.  Digoxin  was added by cardiology.  Family would like to continue current level of care without any escalation at this time and they will decide in next couple of days depending on which direction she will go.  Assessment and Plan: * NSTEMI (non-ST elevated myocardial infarction) (HCC) Chest pain improved, troponin peaked at 18,600. Echocardiogram with regional wall motion abnormalities-consistent with NSTEMI. Family does not want any invasive procedure. - Cardiology is on board -Continue with heparin  infusion -Continue with medical management  Acute on chronic HFrEF (heart failure with reduced ejection fraction) (HCC) Likely secondary to NSTEMI, echocardiogram with EF of 25 to 30% with regional wall motion abnormalities, biventricular failure and biatrial dilatation with moderate MR, TR and moderate to severe AS. EF was normal a month ago and it was mild MR, TR and AS. BNP elevated and chest x-ray with concern of pulmonary vascular congestion. - Continue with IV Lasix  -Daily weight and BMP -Strict intake and output -Daily weight and BMP -Strict intake and output  Chronic atrial fibrillation with RVR (HCC) Heart rate still mildly elevated, now in low 100s -Digoxin  was added -Continue with amiodarone  infusion -Continue with metoprolol  -Continue to monitor   Acute hypoxic respiratory failure (HCC) Initially requiring BiPAP, VBG with concern of hypoxia and hypercapnia.  Repeat VBG with improvement in hypercapnia and pH.  No baseline oxygen use, currently on 10 L of oxygen and remained tachypneic with increased work of breathing. -Continue supplemental oxygen-might need to be placed on BiPAP again but currently patient is little resistant  Transaminitis Likely  secondary to hepatic congestion with acute heart failure. - Continue to monitor  CKD (chronic kidney disease), stage III (HCC) Renal function  currently around baseline.  History of CKD stage III A.  - Monitor renal function while patient is being diuresed -Avoid nephrotoxins  Hypertension Continue with metoprolol  and Lasix   Hypothyroidism TSH normal. - Continue with home Synthroid   Hyperglycemia No prior diagnosis of diabetes, A1c of 6.2. - Continue to monitor  Dyslipidemia - Continue with statin  GERD (gastroesophageal reflux disease) - Continue with PPI   Subjective: Patient was sitting in chair when seen today.  Appears little comfortable and denies any chest pain.  Son at bedside  Physical Exam: Vitals:   05/03/24 1300 05/03/24 1330 05/03/24 1400 05/03/24 1430  BP: (!) 130/99 (!) 113/50 111/84   Pulse: (!) 136 62 83 100  Resp: 20 (!) 28 (!) 35 20  Temp:      TempSrc:      SpO2: 100% 96% 99% (!) 72%  Weight:      Height:       General.  Elderly lady, in no acute distress. Pulmonary.  Few basal crackles bilaterally, normal respiratory effort. CV.  Irregularly irregular Abdomen.  Soft, nontender, nondistended, BS positive. CNS.  Alert and oriented .  No focal neurologic deficit. Extremities.  No edema, no cyanosis, pulses intact and symmetrical. Psychiatry.  Judgment and insight appears normal.   Data Reviewed: Prior data reviewed  Family Communication: DIL at bedside  Disposition: Status is: Inpatient Remains inpatient appropriate because: Severity of illness  Planned Discharge Destination: To be determined  DVT prophylaxis.  Heparin  infusion Time spent: 50 minutes  This record has been created using Conservation officer, historic buildings. Errors have been sought and corrected,but may not always be located. Such creation errors do not reflect on the standard of care.   Author: Amaryllis Dare, MD 05/03/2024 3:25 PM  For on call review [email or web address].

## 2024-05-03 NOTE — Progress Notes (Signed)
 Better day. NPO until swallow eval. Did well with [place] speech therapist. Diet switched to regular food and thickened liquids. Up to the chair with PT/OT and thrilled to be OOB. Still confused at times but family with her all day. Up to [facility] x 3 and voided. Family with patient all day. Repositioned often but patient wished to stay in recliner. Ate all of dinner and snacked on graham crackers and applesauce all afternoon.

## 2024-05-03 NOTE — Progress Notes (Signed)
 Rounding Note   Patient Name: DOLORES EWING Date of Encounter: 05/03/2024  [facility] Cardiologist: [name], MD   Subjective Sitting in the chair, appears comfortable, family at the bedside Remains on amiodarone  infusion Received 2 small doses digoxin  in the past 2 days none today Did not receive metoprolol  tartrate this morning, scheduled for 25 every 6 hours Remains on IV Lasix  40 twice daily, feels her breathing is slowly improving Palliative care in to see patient  Scheduled Meds:  atorvastatin   40 mg Oral QHS   Chlorhexidine  Gluconate Cloth  6 each Topical Daily   digoxin   0.125 mg Intravenous Daily   feeding supplement (GLUCERNA SHAKE)  237 mL Oral TID BM   fluticasone   1 spray Each Nare Daily   furosemide   40 mg Intravenous BID   insulin  aspart  0-9 Units Subcutaneous TID WC   levothyroxine   88 mcg Oral Q0600   loratadine   5 mg Oral Daily   melatonin  10 mg Oral QHS   metoprolol  tartrate  50 mg Oral BID   pantoprazole   40 mg Oral Daily   sodium chloride  flush  3 mL Intravenous Q12H   Continuous Infusions:  amiodarone  30 mg/hr (05/03/24 1228)   heparin  800 Units/hr (05/03/24 0650)   PRN Meds: acetaminophen , ALPRAZolam , ondansetron  (ZOFRAN ) IV, sodium chloride  flush   Vital Signs  Vitals:   05/03/24 1400 05/03/24 1430 05/03/24 1500 05/03/24 1600  BP: 111/84  105/82 101/88  Pulse: 83 100 74 87  Resp: (!) 35 20 (!) 23 18  Temp:    97.7 F (36.5 C)  TempSrc:      SpO2: 99% (!) 72% (!) 82% 97%  Weight:      Height:        Intake/Output Summary (Last 24 hours) at 05/03/2024 1607 Last data filed at 05/03/2024 1430 Gross per 24 hour  Intake 359.04 ml  Output 1100 ml  Net -740.96 ml      05/03/2024    5:00 AM 05/02/2024    5:00 AM 05/01/2024    7:22 AM  Last 3 Weights  Weight (lbs) 128 lb 8.5 oz 132 lb 0.9 oz 110 lb 7.2 oz  Weight (kg) 58.3 kg 59.9 kg 50.1 kg      Telemetry Atrial fibrillation rate 90-110- Personally Reviewed  ECG    - Personally Reviewed  Physical Exam  GEN: No acute distress.  Elderly, frail Neck: No JVD Cardiac: Irregularly irregular, no murmurs, rubs, or gallops.  Respiratory: Clear to auscultation bilaterally. GI: Soft, nontender, non-distended  MS: No edema; No deformity. Neuro:  Nonfocal  Psych: Normal affect   Labs High Sensitivity Troponin:   Recent Labs  Lab 05/01/24 0728 05/01/24 0903 05/02/24 0621  TROPONINIHS 18,600* 14,167* 11,310*     Chemistry Recent Labs  Lab 05/01/24 0728 05/01/24 0903 05/02/24 0339 05/03/24 0455  NA 144  --  142 139  K 3.6  --  4.4 4.2  CL 109  --  106 100  CO2 24  --  22 26  GLUCOSE 263*  --  141* 127*  BUN 19  --  28* 39*  CREATININE 0.98  --  0.93 1.01*  CALCIUM  9.1  --  9.4 9.0  MG  --  2.0  --   --   PROT 8.0  --  7.1  --   ALBUMIN 4.1  --  3.5  --   AST 105*  --  97*  --   ALT 47*  --  41  --   ALKPHOS 131*  --  96  --   BILITOT 0.9  --  1.2  --   GFRNONAA 54*  --  58* 53*  ANIONGAP 11  --  14 13    Lipids No results for input(s): CHOL, TRIG, HDL, LABVLDL, LDLCALC, CHOLHDL in the last 168 hours.  Hematology Recent Labs  Lab 05/01/24 0728 05/02/24 0339 05/03/24 0455  WBC 11.7* 12.6* 15.3*  RBC 4.92 4.50 4.83  HGB 14.1 12.9 13.7  HCT 46.1* 40.7 44.7  MCV 93.7 90.4 92.5  MCH 28.7 28.7 28.4  MCHC 30.6 31.7 30.6  RDW 15.2 15.2 15.4  PLT 235 197 203   Thyroid   Recent Labs  Lab 05/01/24 0903  TSH 3.030    BNP Recent Labs  Lab 05/01/24 0728  BNP 955.8*    DDimer No results for input(s): DDIMER in the last 168 hours.   Radiology  ECHOCARDIOGRAM COMPLETE Result Date: 05/02/2024    ECHOCARDIOGRAM REPORT   Patient Name:   VUNG KUSH Date of Exam: 05/01/2024 Medical Rec #:  [id number]       Height:       60.0 in Accession #:    [id number]      Weight:       110.4 lb Date of Birth:  Aug 03, 1931       BSA:          1.450 m Patient Age:    89 years        BP:           109/86 mmHg Patient Gender: F                HR:           117 bpm. Exam Location:  [facility] Procedure: 2D Echo, Cardiac Doppler and Color Doppler (Both Spectral and Color            Flow Doppler were utilized during procedure). Indications:     NSTEMI I21.4  History:         Patient has prior history of Echocardiogram examinations, most                  recent 04/03/2024. CHF, Previous Myocardial Infarction and CAD,                  Arrythmias:Atrial Fibrillation, Signs/Symptoms:Shortness of                  Breath; Risk Factors:Hypertension and Dyslipidemia.  Sonographer:     Thea Norlander RCS Referring Phys:  [id number] CORT ONEIDA MANA Diagnosing Phys: [name] MD IMPRESSIONS  1. Left ventricular ejection fraction, by estimation, is 25 to 30%. The left ventricle has severely decreased function. The left ventricle demonstrates regional wall motion abnormalities (see scoring diagram/findings for description). Left ventricular diastolic function could not be evaluated.  2. Right ventricular systolic function is moderately reduced. The right ventricular size is normal. There is normal pulmonary artery systolic pressure.  3. Left atrial size was moderately dilated.  4. Right atrial size was severely dilated.  5. Moderate pleural effusion in the left lateral region.  6. The mitral valve is degenerative. Mild to moderate mitral valve regurgitation. Severe mitral annular calcification.  7. Tricuspid valve regurgitation is mild to moderate.  8. The aortic valve has an indeterminant number of cusps. There is mild calcification of the aortic valve. There is moderate thickening of the aortic valve. Aortic valve regurgitation is not visualized. Moderate  to severe low-flow/low-gradient aortic valve stenosis. Aortic valve area, by VTI measures 0.55 cm. Aortic valve mean gradient measures 11.0 mmHg. DI 0.24.  9. The inferior vena cava is normal in size with <50% respiratory variability, suggesting right atrial pressure of 8 mmHg. FINDINGS  Left Ventricle: Left  ventricular ejection fraction, by estimation, is 25 to 30%. The left ventricle has severely decreased function. The left ventricle demonstrates regional wall motion abnormalities. The left ventricular internal cavity size was normal  in size. There is no left ventricular hypertrophy. Left ventricular diastolic function could not be evaluated due to atrial fibrillation. Left ventricular diastolic function could not be evaluated.  LV Wall Scoring: The entire apex is akinetic. The mid anteroseptal segment, mid inferolateral segment, mid anterolateral segment, mid inferoseptal segment, mid anterior segment, and mid inferior segment are hypokinetic. The basal anteroseptal segment, basal inferolateral segment, basal anterolateral segment, basal anterior segment, basal inferior segment, and basal inferoseptal segment are normal. Right Ventricle: The right ventricular size is normal. No increase in right ventricular wall thickness. Right ventricular systolic function is moderately reduced. There is normal pulmonary artery systolic pressure. The tricuspid regurgitant velocity is 2.45 m/s, and with an assumed right atrial pressure of 8 mmHg, the estimated right ventricular systolic pressure is 32.0 mmHg. Left Atrium: Left atrial size was moderately dilated. Right Atrium: Right atrial size was severely dilated. Pericardium: There is no evidence of pericardial effusion. Mitral Valve: The mitral valve is degenerative in appearance. Severe mitral annular calcification. Mild to moderate mitral valve regurgitation. MV peak gradient, 10.7 mmHg. The mean mitral valve gradient is 3.0 mmHg. Tricuspid Valve: The tricuspid valve is normal in structure. Tricuspid valve regurgitation is mild to moderate. Aortic Valve: The aortic valve has an indeterminant number of cusps. There is mild calcification of the aortic valve. There is moderate thickening of the aortic valve. Aortic valve regurgitation is not visualized. Moderate to severe aortic  stenosis is present. Aortic valve mean gradient measures 11.0 mmHg. Aortic valve peak gradient measures 19.4 mmHg. Aortic valve area, by VTI measures 0.55 cm. Pulmonic Valve: The pulmonic valve was normal in structure. Pulmonic valve regurgitation is trivial. No evidence of pulmonic stenosis. Aorta: The aortic root and ascending aorta are structurally normal, with no evidence of dilitation. Pulmonary Artery: The pulmonary artery is of normal size. Venous: The inferior vena cava is normal in size with less than 50% respiratory variability, suggesting right atrial pressure of 8 mmHg. IAS/Shunts: The interatrial septum was not well visualized. Additional Comments: There is a moderate pleural effusion in the left lateral region.  LEFT VENTRICLE PLAX 2D LVIDd:         3.90 cm   Diastology LVIDs:         3.00 cm   LV e' medial:    6.85 cm/s LV PW:         1.00 cm   LV E/e' medial:  23.6 LV IVS:        0.90 cm   LV e' lateral:   11.00 cm/s LVOT diam:     1.70 cm   LV E/e' lateral: 14.7 LV SV:         21 LV SV Index:   15 LVOT Area:     2.27 cm  RIGHT VENTRICLE            IVC RV S prime:     8.27 cm/s  IVC diam: 1.70 cm TAPSE (M-mode): 1.0 cm LEFT ATRIUM  Index        RIGHT ATRIUM           Index LA diam:        4.70 cm 3.24 cm/m   RA Area:     27.70 cm LA Vol (A2C):   59.2 ml 40.82 ml/m  RA Volume:   102.00 ml 70.33 ml/m LA Vol (A4C):   67.5 ml 46.54 ml/m LA Biplane Vol: 64.0 ml 44.13 ml/m  AORTIC VALVE                     PULMONIC VALVE AV Area (Vmax):    0.72 cm      PR End Diast Vel: 5.02 msec AV Area (Vmean):   0.66 cm AV Area (VTI):     0.55 cm AV Vmax:           220.50 cm/s AV Vmean:          151.500 cm/s AV VTI:            0.390 m AV Peak Grad:      19.4 mmHg AV Mean Grad:      11.0 mmHg LVOT Vmax:         70.40 cm/s LVOT Vmean:        44.300 cm/s LVOT VTI:          0.094 m LVOT/AV VTI ratio: 0.24  AORTA Ao Root diam: 2.90 cm Ao Asc diam:  3.00 cm MITRAL VALVE                TRICUSPID VALVE MV  Area (PHT): 6.65 cm     TR Peak grad:   24.0 mmHg MV Area VTI:   0.68 cm     TR Vmax:        245.00 cm/s MV Peak grad:  10.7 mmHg MV Mean grad:  3.0 mmHg     SHUNTS MV Vmax:       1.64 m/s     Systemic VTI:  0.09 m MV Vmean:      79.0 cm/s    Systemic Diam: 1.70 cm MV Decel Time: 114 msec MR Peak grad: 68.6 mmHg MR Vmax:      414.00 cm/s MV E velocity: 162.00 cm/s [name] MD Electronically signed by [name] MD Signature Date/Time: 05/02/2024/9:43:46 AM    Final    DG Chest 1 View Result Date: 05/02/2024 CLINICAL DATA:  Congestive heart failure. EXAM: CHEST  1 VIEW COMPARISON:  May 01, 2024. FINDINGS: Stable cardiomegaly. Minimal to mild interstitial densities are noted concerning for possible pulmonary edema. Small pleural effusions are noted with associated atelectasis. Bony thorax is unremarkable. IMPRESSION: Minimal to mild interstitial densities concerning for possible pulmonary edema. Small pleural effusions. Electronically Signed   By: Lynwood Landy Raddle M.D.   On: 05/02/2024 08:32    Cardiac Studies  05/01/2024 Echo complete 1. Left ventricular ejection fraction, by estimation, is 25 to 30%. The  left ventricle has severely decreased function. The left ventricle  demonstrates regional wall motion abnormalities (see scoring  diagram/findings for description). Left ventricular  diastolic function could not be evaluated.   2. Right ventricular systolic function is moderately reduced. The right  ventricular size is normal. There is normal pulmonary artery systolic  pressure.   3. Left atrial size was moderately dilated.   4. Right atrial size was severely dilated.   5. Moderate pleural effusion in the left lateral region.   6. The mitral valve is degenerative.  Mild to moderate mitral valve  regurgitation. Severe mitral annular calcification.   7. Tricuspid valve regurgitation is mild to moderate.   8. The aortic valve has an indeterminant number of cusps. There is mild   calcification of the aortic valve. There is moderate thickening of the  aortic valve. Aortic valve regurgitation is not visualized. Moderate to  severe low-flow/low-gradient aortic  valve stenosis. Aortic valve area, by VTI measures 0.55 cm. Aortic valve  mean gradient measures 11.0 mmHg. DI 0.24.   9. The inferior vena cava is normal in size with <50% respiratory  variability, suggesting right atrial pressure of 8 mmHg.    Patient Profile   89 y.o. female  with a hx of CAD s/p PCI to the mid LAD in 2010, chronic HFpEF, permanent atrial fibrillation on Coumadin , carotid artery disease, mild aortic valve stenosis, hypertension, hyperlipidemia, CKD, hypothyroidism, and chronic lower extremity edema who was admitted 8/11 with dyspnea, who we are seeing for the ongoing management of atrial fibrillation with RVR, NSTEMI, and newly reduced LV systolic function.    Assessment & Plan    Permanent atrial fibrillation  Atrial fibrillation with RVR -Difficult to control rate -Recommend we continue digoxin  IV 0.125 daily, amiodarone  infusion, metoprolol  tartrate 50 twice daily, heparin  infusion  NSTEMI mild chest discomfort on 8/9 which resolved without intervention - Troponin 81399 > 1416 > 11310 - EKG without acute ischemic changes - Echo shows severely reduced LV function with EF 25-30% and regional wall motion abnormalities, - Interventional cardiology has had a long discussion with family, plan for conservative therapy, no immediate plan for cardiac catheterization - Continue heparin  infusion   Heart failure with newly reduced EF - Prior echo with EF 55-60% - Echo this admission shows EF 25-30% - BNP 955 - CXR with findings concerning for pulmonary edema and small pleural effusions -Recommend we continue IV Lasix  40 twice daily GDMT on hold secondary to hypotension and need to uptitrate metoprolol  as blood pressure will permit   Acute hypoxic respiratory failure - Likely due to volume  overload in the setting of cardiomyopathy, atrial fibrillation with RVR  - Continue IV Lasix   BiPAP for any worsening respiratory distress   For questions or updates, please contact [facility] Please consult [email or web address] for contact info under     Signed, Kamrin Spath, MD  05/03/2024, 4:07 PM

## 2024-05-03 NOTE — Evaluation (Signed)
 Occupational Therapy Evaluation Patient Details Name: Karen Cunningham MRN: [id number] DOB: 08/03/1931 Today's Date: 05/03/2024   History of Present Illness   Pt is a 89 year old female presented with new onset chest pain and shortness of breath and palpitations; admitted with NSTEMI, Acute on chronic HFpEF decompensation,A-fib with RVR,Acute transaminitis    PMH significant for hronic A-fib on Coumadin , HFpEF, CKD stage III b, CAD status post stenting in 2008 in 2010, hypothyroidism     Clinical Impressions Chart reviewed, pt greeted semi supine in bed with two daughter in laws present. Pt is HOH. Pt is oriented to self and place, restless/impulsive at times but able to be redirected. She thinks myself and the PT can come clean her house. Family reports pt lives alone, is grossly MOD I for ADL, has assist for IADL from family and amb with SPC. They report family checks on her daily. Pt presents with deficits in cognition, strength, endurance, activity tolerance, balance, affecting safe and optimal ADL completion. Bed mobility completed with MAX A +2, STS with MIN A +2 via HHA and RW, SPT to bedside commode with MAX A +2 (for lines/leads) and multi modal cueing. Pt performs toileting with MIN A, short amb transfer to bedside chair with RW with MIN A +2. Pt HR into 140s-150s bpm after amb to chair, when pt is speaking. She appears to generally be resting at 110-120s bpm after transfer attempts. Spo2 >90% on 10L via HFNC throughout. Educated DILs regarding discharge recommendations, importance of continued mobility attempts with staff. Pt is left in bedside chair, safety maintained, all needs met. OT will follow acutely to facilitate optimal ADL/functional mobility performance.      If plan is discharge home, recommend the following:   A lot of help with walking and/or transfers;A lot of help with bathing/dressing/bathroom;Supervision due to cognitive status     Functional Status Assessment    Patient has had a recent decline in their functional status and demonstrates the ability to make significant improvements in function in a reasonable and predictable amount of time.     Equipment Recommendations   BSC/3in1     Recommendations for Other Services         Precautions/Restrictions   Precautions Precautions: Fall Recall of Precautions/Restrictions: Impaired Restrictions Weight Bearing Restrictions Per Provider Order: No     Mobility Bed Mobility Overal bed mobility: Needs Assistance Bed Mobility: Supine to Sit     Supine to sit: Max assist, +2 for physical assistance, HOB elevated          Transfers Overall transfer level: Needs assistance Equipment used: Rolling walker (2 wheels), 1 person hand held assist Transfers: Sit to/from Stand Sit to Stand: Min assist +2    Short amb transfer from bsc to chair with MIN A +2 with RW       General transfer comment: frequent multi modal cues for technique      Balance Overall balance assessment: Needs assistance Sitting-balance support: Feet supported Sitting balance-Leahy Scale: Fair     Standing balance support: Bilateral upper extremity supported, During functional activity, Reliant on assistive device for balance Standing balance-Leahy Scale: Poor                             ADL either performed or assessed with clinical judgement   ADL Overall ADL's : Needs assistance/impaired  Lower Body Dressing: Maximal assistance;Sitting/lateral leans   Toilet Transfer: Maximal assistance;BSC/3in1;+2 for physical assistance Toilet Transfer Details (indicate cue type and reason): step by step multi modal cues for technique, starated at MIN A via HHA, progress to MAX A to guide hips to bsc Toileting- Clothing Manipulation and Hygiene: Minimal assistance;Sitting/lateral lean Toileting - Clothing Manipulation Details (indicate cue type and reason): continent urine on  toilet             Vision Patient Visual Report: No change from baseline       Perception         Praxis         Pertinent Vitals/Pain Pain Assessment Pain Assessment: No/denies pain     Extremity/Trunk Assessment Upper Extremity Assessment Upper Extremity Assessment: Generalized weakness   Lower Extremity Assessment Lower Extremity Assessment: Generalized weakness       Communication Communication Communication: Impaired Factors Affecting Communication: Hearing impaired   Cognition Arousal: Alert Behavior During Therapy: Impulsive, Restless (but redirectable) Cognition: Cognition impaired   Orientation impairments: Time, Situation Awareness: Intellectual awareness impaired, Online awareness impaired Memory impairment (select all impairments): Declarative long-term memory, Short-term memory, Working memory Attention impairment (select first level of impairment): Focused attention Executive functioning impairment (select all impairments): Sequencing, Reasoning, Problem solving                   Following commands: Impaired Following commands impaired: Follows one step commands with increased time     Cueing  General Comments   Cueing Techniques: Verbal cues;Gestural cues;Visual cues;Tactile cues  HR to 150 bpm after getting into the chair, generally resting between 110-140 bpm;   Exercises Other Exercises Other Exercises: edu pt/DILs re role of OT, role of rehab, discharge recommendation, safe ADL completion   Shoulder Instructions      Home Living Family/patient expects to be discharged to:: Private residence Living Arrangements: Alone Available Help at Discharge: Family Type of Home: House Home Access: Stairs to enter Secretary/administrator of Steps: 4 Entrance Stairs-Rails: Right Home Layout: One level     Bathroom Shower/Tub: Tub/shower unit;Sponge bathes at baseline   [employer]: Standard     Home Equipment: Agricultural consultant  (2 wheels);Cane - single point;Shower seat;BSC/3in1          Prior Functioning/Environment Prior Level of Function : Independent/Modified Independent             Mobility Comments: amb with SPC ADLs Comments: MOD I- I with ADL, sits in the shower or sponge bathes; family assists with IADLs, they check on her daily    OT Problem List: Decreased strength;Impaired balance (sitting and/or standing);Decreased cognition;Decreased activity tolerance;Decreased knowledge of use of DME or AE;Decreased safety awareness;Cardiopulmonary status limiting activity   OT Treatment/Interventions: Self-care/ADL training;DME and/or AE instruction;Therapeutic activities;Balance training;Therapeutic exercise;Cognitive remediation/compensation;Energy conservation;Patient/family education      OT Goals(Current goals can be found in the care plan section)   Acute Rehab OT Goals Patient Stated Goal: go home OT Goal Formulation: With patient Time For Goal Achievement: 05/17/24 Potential to Achieve Goals: Fair ADL Goals Pt Will Perform Grooming: with modified independence;standing;sitting Pt Will Perform Lower Body Dressing: with modified independence;sit to/from stand;sitting/lateral leans Pt Will Transfer to Toilet: with modified independence;ambulating Pt Will Perform Toileting - Clothing Manipulation and hygiene: with modified independence;sitting/lateral leans;sit to/from stand   OT Frequency:  Min 3X/week    Co-evaluation PT/OT/SLP Co-Evaluation/Treatment: Yes Reason for Co-Treatment: For patient/therapist safety;Necessary to address cognition/behavior during functional activity;Complexity of the patient's impairments (multi-system involvement);To  address functional/ADL transfers          AM-PAC OT 6 Clicks Daily Activity     Outcome Measure Help from another person eating meals?: A Little Help from another person taking care of personal grooming?: A Little Help from another person  toileting, which includes using toliet, bedpan, or urinal?: A Lot Help from another person bathing (including washing, rinsing, drying)?: A Lot Help from another person to put on and taking off regular upper body clothing?: A Little Help from another person to put on and taking off regular lower body clothing?: A Lot 6 Click Score: 15   End of Session Equipment Utilized During Treatment: Rolling walker (2 wheels);Oxygen Nurse Communication: Mobility status  Activity Tolerance: Patient tolerated treatment well Patient left: in chair;with call bell/phone within reach;with chair alarm set;with family/visitor present  OT Visit Diagnosis: Other abnormalities of gait and mobility (R26.89);Muscle weakness (generalized) (M62.81)                Time: 9148-9076 OT Time Calculation (min): 32 min Charges:  OT General Charges $OT Visit: 1 Visit OT Evaluation $OT Eval High Complexity: 1 High  Therisa Sheffield, OTD OTR/L  05/03/24, 11:06 AM

## 2024-05-03 NOTE — Plan of Care (Signed)
   Problem: Coping: Goal: Level of anxiety will decrease Outcome: Progressing   Problem: Pain Managment: Goal: General experience of comfort will improve and/or be controlled Outcome: Progressing   Problem: Safety: Goal: Ability to remain free from injury will improve Outcome: Progressing

## 2024-05-03 NOTE — Progress Notes (Signed)
 Palliative:  HPI: 89 y.o. female  with past medical history of HFpEF, chronic Afib on warfarin, CKD stage 3b, CAD s/p stent, hypothyroidism admitted on 05/01/2024 with chest pain and shortness of breath with NSTEMI, declined EF, unstable Afib, respiratory distress.     I met again today at Karen Cunningham along with son and daughter-in-law, Karen Cunningham and Karen Cunningham, at bedside. Karen Cunningham is in good spirits. She is sleepy and not as anxious. Karen Cunningham shares that Karen Cunningham struggled to get to sleep and then after sleeping a few hours she woke up confused and agitated pulling off BiPAP. We reviewed that it is common to have confusion based on her ICU stay, acute illness, as well as medication such as Xanax  can all lead to confusion. Her HR is more stable rate today (120s) and she is less anxious. Awaiting SLP evaluation before pursuing intake. I did explain with fluid buildup and increased support needed for breathing that it can be difficult to coordinate breathing and swallowing but hopefully SLP can offer some techniques to make this better for her. They are committed to continuing medical treatment to optimize her health. They are okay with ongoing trial of BiPAP support as needed and titration of medications as needed. We will reconsider goals if she declines further, becomes BiPAP dependent, or has more symptom burden. Family do understand poor prognosis but also know how resilient Karen Cunningham can be. Karen Cunningham does comment that he hopes her improvement today is not just a rally - we discussed that only time will tell.   Karen Cunningham expresses concern over discussion of SNF rehab. She knows that Karen Cunningham will not want to go anywhere but home and this is going to be a challenge. Karen Cunningham is concerned that she will not be able to care for Karen Cunningham at home but dreads discussion of facility placement with Karen Cunningham. We did discuss that we have some hurdles with titrating IV meds to po as well as ongoing high oxygen needs before she is  stable to leave the hospital. Family is concerned about rushing her out too soon. They are not prepared to consider hospice support at home at this time. I reassure them there are still some things we need to work on before she can leave. We will continue to discuss best options based on progress. I also discussed pet visitation policy with family to increase spirits and QOL.   All questions/concerns addressed. Emotional support provided.   Exam: Alert but sleepy. Good spirits. Less anxious today. Breathing regular, unlabored on 10L . Congestion and fluid overload. Abd soft. Moves all extremities.   Plan: - DNR/DNI - Ok with ongoing medical management and BiPAP if needed - Time for outcomes - Ongoing palliative f/u and support - Family anxious about next steps after leaving the hospital  60 min  Bernarda Kitty, NP Palliative Medicine Team Pager [phone] (Please see [email or web address] for schedule) Team Phone [phone]

## 2024-05-03 NOTE — Assessment & Plan Note (Signed)
 Continue with metoprolol  and Lasix 

## 2024-05-03 NOTE — Evaluation (Signed)
 Physical Therapy Evaluation Patient Details Name: Karen Cunningham MRN: [id number] DOB: Sep 13, 1931 Today's Date: 05/03/2024  History of Present Illness  89 y/o female presented to ED on 05/01/24 for respiratory distress and chest pain. Admitted for NSTEMI and acute on chronic HFpEF decompensation. PMH: Afib, HFpEF, CKD stage IIIb, CAD s/p stent  Clinical Impression  Patient admitted with the above. PTA, patient lives alone and was modI with use of cane. Oriented to self and place during session and impulsive but redirectable. Patient presents with weakness, impaired balance, and decreased activity tolerance. Required maxA+2 for bed mobility and minA+2 to stand from EOB with HHA and complete transfer to [facility] maxA. Required minA+2 for short ambulation to recliner with HHA. HR up to 140s-150s after mobility and spO2 >90% on 10L HFNC. Patient will benefit from skilled PT services during acute stay to address listed deficits. Patient will benefit from ongoing therapy at discharge to maximize functional independence and safety.       If plan is discharge home, recommend the following: A lot of help with walking and/or transfers;A little help with bathing/dressing/bathroom;Assistance with cooking/housework;Direct supervision/assist for financial management;Direct supervision/assist for medications management;Assist for transportation;Help with stairs or ramp for entrance;Supervision due to cognitive status   Can travel by private vehicle   Yes    Equipment Recommendations Rolling Zaylin Pistilli (2 wheels);BSC/3in1  Recommendations for Other Services       Functional Status Assessment Patient has had a recent decline in their functional status and demonstrates the ability to make significant improvements in function in a reasonable and predictable amount of time.     Precautions / Restrictions Precautions Precautions: Fall Recall of Precautions/Restrictions: Impaired Restrictions Weight Bearing  Restrictions Per Provider Order: No      Mobility  Bed Mobility Overal bed mobility: Needs Assistance Bed Mobility: Supine to Sit     Supine to sit: Max assist, +2 for physical assistance, HOB elevated          Transfers Overall transfer level: Needs assistance Equipment used: 1 person hand held assist Transfers: Sit to/from Stand, Bed to chair/wheelchair/BSC Sit to Stand: Min assist, +2 physical assistance, +2 safety/equipment   Step pivot transfers: Min assist, Mod assist, +2 safety/equipment            Ambulation/Gait                  Stairs            Wheelchair Mobility     Tilt Bed    Modified Rankin (Stroke Patients Only)       Balance Overall balance assessment: Needs assistance Sitting-balance support: Feet supported Sitting balance-Leahy Scale: Fair     Standing balance support: Bilateral upper extremity supported, During functional activity, Reliant on assistive device for balance Standing balance-Leahy Scale: Poor                               Pertinent Vitals/Pain Pain Assessment Pain Assessment: No/denies pain    Home Living Family/patient expects to be discharged to:: Private residence Living Arrangements: Alone Available Help at Discharge: Family Type of Home: House Home Access: Stairs to enter Entrance Stairs-Rails: Right Entrance Stairs-Number of Steps: 4   Home Layout: One level Home Equipment: Agricultural consultant (2 wheels);Cane - single point;Shower seat;BSC/3in1      Prior Function Prior Level of Function : Independent/Modified Independent             Mobility Comments:  amb with SPC ADLs Comments: MOD I- I with ADL, sits in the shower or sponge bathes; family assists with IADLs, they check on her daily     Extremity/Trunk Assessment   Upper Extremity Assessment Upper Extremity Assessment: Defer to OT evaluation    Lower Extremity Assessment Lower Extremity Assessment: Generalized  weakness       Communication   Communication Communication: Impaired Factors Affecting Communication: Hearing impaired    Cognition Arousal: Alert Behavior During Therapy: Impulsive, Restless (but redirectable)   PT - Cognitive impairments: Orientation, Awareness, Memory, Attention, Safety/Judgement, Problem solving   Orientation impairments: Situation, Time                     Following commands: Impaired Following commands impaired: Follows one step commands with increased time     Cueing Cueing Techniques: Verbal cues, Gestural cues, Visual cues, Tactile cues     General Comments General comments (skin integrity, edema, etc.): HR to 150 bpm after getting into the chair, generally resting between 110-140 bpm;    Exercises     Assessment/Plan    PT Assessment Patient needs continued PT services  PT Problem List Decreased strength;Decreased activity tolerance;Decreased balance;Decreased mobility;Decreased safety awareness;Decreased knowledge of precautions;Cardiopulmonary status limiting activity       PT Treatment Interventions Gait training;DME instruction;Functional mobility training;Therapeutic activities;Therapeutic exercise;Balance training;Neuromuscular re-education;Patient/family education    PT Goals (Current goals can be found in the Care Plan section)  Acute Rehab PT Goals Patient Stated Goal: did not state PT Goal Formulation: With patient/family Time For Goal Achievement: 05/17/24 Potential to Achieve Goals: Fair    Frequency Min 3X/week     Co-evaluation   Reason for Co-Treatment: For patient/therapist safety;Necessary to address cognition/behavior during functional activity;Complexity of the patient's impairments (multi-system involvement);To address functional/ADL transfers           AM-PAC PT 6 Clicks Mobility  Outcome Measure Help needed turning from your back to your side while in a flat bed without using bedrails?: A Lot Help  needed moving from lying on your back to sitting on the side of a flat bed without using bedrails?: A Lot Help needed moving to and from a bed to a chair (including a wheelchair)?: A Lot Help needed standing up from a chair using your arms (e.g., wheelchair or bedside chair)?: Total Help needed to walk in hospital room?: Total Help needed climbing 3-5 steps with a railing? : Total 6 Click Score: 9    End of Session Equipment Utilized During Treatment: Oxygen Activity Tolerance: Patient tolerated treatment well Patient left: in chair;with call bell/phone within reach;with chair alarm set;with family/visitor present Nurse Communication: Mobility status PT Visit Diagnosis: Unsteadiness on feet (R26.81);Muscle weakness (generalized) (M62.81)    Time: 9148-9076 PT Time Calculation (min) (ACUTE ONLY): 32 min   Charges:   PT Evaluation $PT Eval Moderate Complexity: 1 Mod   PT General Charges $$ ACUTE PT VISIT: 1 Visit         Maryanne Finder, PT, DPT Physical Therapist - [facility]  [facility]   Brynlei Klausner A Toia Micale 05/03/2024, 1:25 PM

## 2024-05-03 NOTE — Assessment & Plan Note (Addendum)
 Initially requiring BiPAP, VBG with concern of hypoxia and hypercapnia.  Repeat VBG with improvement in hypercapnia and pH.  No baseline oxygen use,  Now improved and patient is on room air -Continue supplemental oxygen-wean as tolerated

## 2024-05-03 NOTE — Consult Note (Signed)
 PHARMACY - ANTICOAGULATION CONSULT NOTE  Pharmacy Consult for heparin  Indication: chest pain/ACS  Allergies  Allergen Reactions   Codeine Nausea And Vomiting   Nsaids     Dr instructed to avoid due to kidney issues    Ibuprofen Other (See Comments)    Dr instructed to avoid due to kidney issues    Prednisone Other (See Comments)    ELEVATED BLOOD PRESSURE.    Patient Measurements: Height: 5' (152.4 cm) Weight: 58.3 kg (128 lb 8.5 oz) IBW/kg (Calculated) : 45.5 HEPARIN  DW (KG): 50.1  Vital Signs: Temp: 97.8 F (36.6 C) (08/13 1200) Temp Source: Oral (08/13 0400) BP: 111/84 (08/13 1400) Pulse Rate: 100 (08/13 1430)  Labs: Recent Labs    05/01/24 0728 05/01/24 0903 05/01/24 1954 05/02/24 0339 05/02/24 0621 05/03/24 0455 05/03/24 1512  HGB 14.1  --   --  12.9  --  13.7  --   HCT 46.1*  --   --  40.7  --  44.7  --   PLT 235  --   --  197  --  203  --   APTT  --  29  --   --   --   --   --   LABPROT 20.6*  --   --  20.3*  --   --   --   INR 1.7*  --   --  1.6*  --   --   --   HEPARINUNFRC  --   --    < > 0.47  --  0.11* 0.43  CREATININE 0.98  --   --  0.93  --  1.01*  --   CKTOTAL  --  660*  --   --   --   --   --   TROPONINIHS 18,600* 14,167*  --   --  11,310*  --   --    < > = values in this interval not displayed.    Estimated Creatinine Clearance: 29 mL/min (A) (by C-G formula based on SCr of 1.01 mg/dL (H)).   Medical History: Past Medical History:  Diagnosis Date   (HFpEF) heart failure with preserved ejection fraction (HCC)    a. 07/2016 Echo: EF 51%; b. 12/2016 Echo: EF 41%. Mod TR, mild PR; c. 09/2017 Echo [facility]): EF 62%, Sev BAE. Mod TR, mild PR; d. 03/2019 Echo([facility]): EF 57%, Gr3 DD. Sev dil LA, mild dil RA. Midly dil Asc Ao. Trace AI. Mild to mod TR, mild PR. RVSP .   Anemia    Arthritis    Carotid arterial disease (HCC)    a. 10/2013 Mod to Sev RICA dzs. Mild dzs in L bulb; b. 10/2013 CTA Carotids: LICA 50p, RICA w/o stenosis.   CKD (chronic  kidney disease), stage III (HCC)    Coronary artery disease    a. Several neg MV between 2008 and 2010; b. 2010 s/p PCI/DES to the mLAD; c. 10/2013 MV: Fixed apical defect, likely breast attenuation, no ischemia; d. 09/2014 MV: No ischemia; e. 03/2018 MV [facility]): no ischemia.   GERD (gastroesophageal reflux disease)    HLD (hyperlipidemia)    Hypertension    Hypothyroidism    Permanent atrial fibrillation (HCC)    a. Dx ~ 2017. CHA2DS2VASc = 6-->coumadin .    Medications:  Scheduled:   atorvastatin   40 mg Oral QHS   Chlorhexidine  Gluconate Cloth  6 each Topical Daily   digoxin   0.125 mg Intravenous Daily   feeding supplement (GLUCERNA SHAKE)  237 mL Oral  TID BM   fluticasone   1 spray Each Nare Daily   furosemide   40 mg Intravenous BID   insulin  aspart  0-9 Units Subcutaneous TID WC   levothyroxine   88 mcg Oral Q0600   loratadine   5 mg Oral Daily   melatonin  10 mg Oral QHS   metoprolol  tartrate  50 mg Oral BID   pantoprazole   40 mg Oral Daily   sodium chloride  flush  3 mL Intravenous Q12H   Infusions:   amiodarone  30 mg/hr (05/03/24 1228)   heparin  800 Units/hr (05/03/24 0650)   PRN:   Assessment: 89 year old female presenting with ACS/possible STEMI with troponin of 18.600 ng/L. Notable PMH permanent Afib with warfarin use, HFpEF, HTN, PVD, and hypothyroidism. CBC notable for hemoglobin of 14.1 g/dL and platelet count of 764 K/uL. Serum creatinine of 0.98 mg/dL (CrCl 27 mL/min) at baseline.   Notable fill history for warfarin 2 mg daily with current INR of 1.7. Will use actual body weight of 50 kg for heparin  dosing since patient is non-obese.   8/11 1954 HL 0.43 8/12 0339 HL 0.47, therapeutic X 2  8/13 0455 HL 0. 11, SUBtherapeutic   Goal of Therapy:  Heparin  level 0.3-0.7 units/ml Monitor platelets by anticoagulation protocol: Yes   Plan:  8/13:  HL @ 1512 = 0.43, therapeutic x 1 - Continue heparin  drip rate at 800 units/hr - Check confirmatory HL in 8 hrs - CBC daily    Idolina DELENA Percy, PharmD Clinical Pharmacist 05/03/2024 3:33 PM

## 2024-05-03 NOTE — Consult Note (Signed)
 PHARMACY - ANTICOAGULATION CONSULT NOTE  Pharmacy Consult for heparin  Indication: chest pain/ACS  Allergies  Allergen Reactions   Codeine Nausea And Vomiting   Nsaids     Dr instructed to avoid due to kidney issues    Ibuprofen Other (See Comments)    Dr instructed to avoid due to kidney issues    Prednisone Other (See Comments)    ELEVATED BLOOD PRESSURE.    Patient Measurements: Height: 5' (152.4 cm) Weight: 58.3 kg (128 lb 8.5 oz) IBW/kg (Calculated) : 45.5 HEPARIN  DW (KG): 50.1  Vital Signs: Temp: 97.6 F (36.4 C) (08/13 0400) Temp Source: Oral (08/13 0400) BP: 94/52 (08/13 0600) Pulse Rate: 86 (08/13 0600)  Labs: Recent Labs    05/01/24 0728 05/01/24 0903 05/01/24 1954 05/02/24 0339 05/02/24 0621 05/03/24 0455  HGB 14.1  --   --  12.9  --  13.7  HCT 46.1*  --   --  40.7  --  44.7  PLT 235  --   --  197  --  203  APTT  --  29  --   --   --   --   LABPROT 20.6*  --   --  20.3*  --   --   INR 1.7*  --   --  1.6*  --   --   HEPARINUNFRC  --   --  0.43 0.47  --  0.11*  CREATININE 0.98  --   --  0.93  --  1.01*  CKTOTAL  --  660*  --   --   --   --   TROPONINIHS 18,600* 14,167*  --   --  11,310*  --     Estimated Creatinine Clearance: 29 mL/min (A) (by C-G formula based on SCr of 1.01 mg/dL (H)).   Medical History: Past Medical History:  Diagnosis Date   (HFpEF) heart failure with preserved ejection fraction (HCC)    a. 07/2016 Echo: EF 51%; b. 12/2016 Echo: EF 41%. Mod TR, mild PR; c. 09/2017 Echo [facility]): EF 62%, Sev BAE. Mod TR, mild PR; d. 03/2019 Echo([facility]): EF 57%, Gr3 DD. Sev dil LA, mild dil RA. Midly dil Asc Ao. Trace AI. Mild to mod TR, mild PR. RVSP .   Anemia    Arthritis    Carotid arterial disease (HCC)    a. 10/2013 Mod to Sev RICA dzs. Mild dzs in L bulb; b. 10/2013 CTA Carotids: LICA 50p, RICA w/o stenosis.   CKD (chronic kidney disease), stage III (HCC)    Coronary artery disease    a. Several neg MV between 2008 and 2010; b. 2010 s/p  PCI/DES to the mLAD; c. 10/2013 MV: Fixed apical defect, likely breast attenuation, no ischemia; d. 09/2014 MV: No ischemia; e. 03/2018 MV [facility]): no ischemia.   GERD (gastroesophageal reflux disease)    HLD (hyperlipidemia)    Hypertension    Hypothyroidism    Permanent atrial fibrillation (HCC)    a. Dx ~ 2017. CHA2DS2VASc = 6-->coumadin .    Medications:  Scheduled:   atorvastatin   40 mg Oral QHS   Chlorhexidine  Gluconate Cloth  6 each Topical Daily   feeding supplement (GLUCERNA SHAKE)  237 mL Oral TID BM   fluticasone   1 spray Each Nare Daily   furosemide   40 mg Intravenous BID   heparin   1,500 Units Intravenous Once   insulin  aspart  0-9 Units Subcutaneous TID WC   levothyroxine   88 mcg Oral Q0600   loratadine   5 mg Oral  Daily   melatonin  10 mg Oral QHS   metoprolol  tartrate  25 mg Oral Q6H   pantoprazole   40 mg Oral Daily   sodium chloride  flush  3 mL Intravenous Q12H   Infusions:   amiodarone  30 mg/hr (05/03/24 0615)   heparin  600 Units/hr (05/03/24 0615)   PRN:   Assessment: 89 year old female presenting with ACS/possible STEMI with troponin of 18.600 ng/L. Notable PMH permanent Afib with warfarin use, HFpEF, HTN, PVD, and hypothyroidism. CBC notable for hemoglobin of 14.1 g/dL and platelet count of 764 K/uL. Serum creatinine of 0.98 mg/dL (CrCl 27 mL/min) at baseline.   Notable fill history for warfarin 2 mg daily with current INR of 1.7. Will use actual body weight of 50 kg for heparin  dosing since patient is non-obese.   8/11 1954 HL 0.43 8/12 0339 HL 0.47, therapeutic X 2  8/13 0455 HL 0. 11, SUBtherapeutic   Goal of Therapy:  Heparin  level 0.3-0.7 units/ml Monitor platelets by anticoagulation protocol: Yes   Plan:  8/13:  HL @ 0455 = 0.11, SUBtherapeutic  - RN states no interruptions in heparin  infusion - Will order heparin  1500 units IV X 1 bolus and increase drip rate to 800 units/hr - Recheck HL 8 hrs after rate change  - CBC daily   Jacayla Nordell D -  PharmD  05/03/2024,6:37 AM

## 2024-05-03 NOTE — Evaluation (Addendum)
 Clinical/Bedside Swallow Evaluation Patient Details  Name: Karen Cunningham MRN: [id number] Date of Birth: [birth year]  Today's Date: 05/03/2024 Time: SLP Start Time (ACUTE ONLY): 1200 SLP Stop Time (ACUTE ONLY): 1300 SLP Time Calculation (min) (ACUTE ONLY): 60 min  Past Medical History:  Past Medical History:  Diagnosis Date   (HFpEF) heart failure with preserved ejection fraction (HCC)    a. 07/2016 Echo: EF 51%; b. 12/2016 Echo: EF 41%. Mod TR, mild PR; c. 09/2017 Echo [facility]): EF 62%, Sev BAE. Mod TR, mild PR; d. 03/2019 Echo([facility]): EF 57%, Gr3 DD. Sev dil LA, mild dil RA. Midly dil Asc Ao. Trace AI. Mild to mod TR, mild PR. RVSP .   Anemia    Arthritis    Carotid arterial disease (HCC)    a. 10/2013 Mod to Sev RICA dzs. Mild dzs in L bulb; b. 10/2013 CTA Carotids: LICA 50p, RICA w/o stenosis.   CKD (chronic kidney disease), stage III (HCC)    Coronary artery disease    a. Several neg MV between 2008 and 2010; b. 2010 s/p PCI/DES to the mLAD; c. 10/2013 MV: Fixed apical defect, likely breast attenuation, no ischemia; d. 09/2014 MV: No ischemia; e. 03/2018 MV [facility]): no ischemia.   GERD (gastroesophageal reflux disease)    HLD (hyperlipidemia)    Hypertension    Hypothyroidism    Permanent atrial fibrillation (HCC)    a. Dx ~ 2017. CHA2DS2VASc = 6-->coumadin .   Past Surgical History:  Past Surgical History:  Procedure Laterality Date   ABDOMINAL HYSTERECTOMY     APPENDECTOMY     BACK SURGERY     CARDIAC CATHETERIZATION     x1 stent;[facility]   CORONARY STENT PLACEMENT     JOINT REPLACEMENT Left    hip   JOINT REPLACEMENT Bilateral    knees   NISSEN FUNDOPLICATION     THORACOTOMY     TOTAL HIP ARTHROPLASTY Right 03/09/2018   Procedure: TOTAL HIP ARTHROPLASTY;  Surgeon: Mardee Lynwood SQUIBB, MD;  Location: [facility] ORS;  Service: Orthopedics;  Laterality: Right;   HPI:  Pt is a 89 y.o. female with medical history significant of a Nissen Fundoplication ~40 years ago per pt/family, chronic  A-fib on Coumadin , HFpEF, CHF, CKD stage III b, CAD status post stenting in 2008 in 2010, hypothyroidism,  Presented with new onset chest pain and shortness of breath and palpitations. Symptoms started yesterday, patient started complaining about intermittent pressure-like chest pain, associated with shortness of breath and malaise and belly bloating worsening with activity, overnight patient started develop severe shortness of breath and could not lie flat on the bed, sitting and walking around to find a comfortable position.  Denies any fever chills no dysuria no diarrhea or nauseous vomiting.  She occasionally feels palpitations only in certain body position.  No headache or vision changes.  Family called EMS this morning EMS found patient heart rate in 170s and hypoxic and placed patient on CPAP.  Pt has acute on chronic heart failure; remains on IV Lasix  40 twice daily, feels her breathing is slowly improving per MD notes.    CXR IMaging this admit: Diffusely increased interstitial opacities, which may represent  pulmonary edema or atypical infection.  2. Similar blunting of bilateral costophrenic angles, which may  represent small pleural effusions.     Assessment / Plan / Recommendation  Clinical Impression   Pt seen for BSE today. Son present during session. Pt awake, sitting in chair. Verbal and enjoyed conversation w/ this SLP, Son  present. Pt is HOH. Noted mildly wet vocal quality at Baseline during converation PRIOR TO any po's given. Pt c/o increased Phlegm, cough as well as is being tx'd for increased Pulmonary fluid currently. Pt has acute on chronic heart failure; remains on IV Lasix  40 twice daily, feels her breathing is slowly improving per MD notes.  Pt and Family deny any gross swallowing problems at home prior but seemed to endorse she may have had a little coughing at meals; pt agreed when asked further and pointed to her mid-sternum stating it's probably d/t this right here  (Nissen?).  Pt on 5L; WBC elevated. Afebrile.  Pt appears to present w/ min dysphagia in setting of Pulmonary illness/decline and hospitalization, w/ suspected potential for pharyngeal phase swallow. No oral phase dysphagia noted. No muscular deficits noted; potential sensory deficits/delay of pharyngeal swallow timing(?). Pt consumed po trials w/ inconsistent, min throat clearing w/ trials of thin liquids- no decline in her Pulmonary presentation during/post. No other overt, clinical s/s of aspiration during po trials.  Pt appears at min risk for aspiration/aspiration pneumonia but this risk is reduced when following general aspiration precautions w/ oral intake. Pt does have challenging factors that could impact her oropharyngeal swallowing to include CardioPulmonary decline at Baseline, deconditioning/weakness, and advanced age(89y). These factors can increase risk for aspiration, dysphagia as well as decreased oral intake overall.   During po trials, pt exhibited the mild, inconsistent throat clearing/cough w/ sips of thin liquids via Cup; aspiration precautions followed. No gross coughing, no decline in vocal quality, nor change in respiratory presentation during/post trials- O2 sats remained 98-100%. No immediate s/s of aspiration noted w/ 2 sip trials of Nectar liquids via Cup- pt DECLINED more of the Nectar drink stating distaste of it. Son agreed. Oral phase appeared [facility] w/ timely bolus management, mastication, and control of bolus propulsion for A-P transfer for swallowing. Oral clearing achieved w/ all trial consistencies -- moistened, soft foods given. OM Exam appeared [facility] w/ no unilateral weakness noted. Speech Clear. Pt fed self w/ setup support.   Recommend a fairly Regular consistency diet w/ well-Cut meats, moistened foods; Thin liquids -- carefully monitor Cup drinking for Small, Single Sips. Recommend general aspiration precautions, Reduce distractions and No Talking during oral intake.  Rest Breaks as needed to avoid any WOB during meals. Pills WHOLE in Puree for safer, easier swallowing -- this was encouraged now and for D/C to the Son.   Education given on Pills in Puree; food consistencies and easy to eat options; aspiration precautions to pt and Son. ST services will follow to monitor pt's status and toleration of diet next 1-2 days; ongoing discussion of swallowing and her risk factors. NSG updated, agreed. MD updated. Recommend Dietician f/u for support. Recommend Palliative Care f/u for GOC/support. SLP Visit Diagnosis: Dysphagia, unspecified (R13.10) (declined Pulmonary status currently; advanced Age.)    Aspiration Risk  Mild aspiration risk;Risk for inadequate nutrition/hydration    Diet Recommendation   Thin;Age appropriate regular (cut/chopped meats moistened well for ease of chewing) = a fairly Regular consistency diet w/ well-Cut meats, moistened foods; Thin liquids -- carefully monitor Cup drinking for Small, Single Sips. Recommend general aspiration precautions, Reduce distractions and No Talking during oral intake. Rest Breaks as needed to avoid any WOB during meals.   Medication Administration: Whole meds with puree    Other  Recommendations Recommended Consults:  (Palliative Care following for GOC) Oral Care Recommendations: Oral care BID;Patient independent with oral care (setup)  Assistance Recommended at Discharge  Intermittent for support at meals- sitting up, setup  Functional Status Assessment Patient has had a recent decline in their functional status and demonstrates the ability to make significant improvements in function in a reasonable and predictable amount of time.  Frequency and Duration min 1 x/week  1 week       Prognosis Prognosis for improved oropharyngeal function: Fair (-Good) Barriers to Reach Goals: Time post onset;Severity of deficits Barriers/Prognosis Comment: declined Pulmonary status currently; advanced Age.      Swallow  Study   General Date of Onset: 05/01/24 HPI: Pt is a 89 y.o. female with medical history significant of chronic A-fib on Coumadin , HFpEF, CHF, CKD stage III b, CAD status post stenting in 2008 in 2010, hypothyroidism,  Presented with new onset chest pain and shortness of breath and palpitations.     Symptoms started yesterday, patient started complaining about intermittent pressure-like chest pain, associated with shortness of breath and malaise and belly bloating worsening with activity, overnight patient started develop severe shortness of breath and could not lie flat on the bed, sitting and walking around to find a comfortable position.  Denies any fever chills no dysuria no diarrhea or nauseous vomiting.  She occasionally feels palpitations only in certain body position.  No headache or vision changes.  Family called EMS this morning EMS found patient heart rate in 170s and hypoxic and placed patient on CPAP.  Pt has acute on chronic heart failure; remains on IV Lasix  40 twice daily, feels her breathing is slowly improving per MD notes.   CXR IMaging this admit: Diffusely increased interstitial opacities, which may represent  pulmonary edema or atypical infection.  2. Similar blunting of bilateral costophrenic angles, which may  represent small pleural effusions. Type of Study: Bedside Swallow Evaluation Previous Swallow Assessment: none Diet Prior to this Study: NPO Temperature Spikes Noted: No (wbc 15.3) Respiratory Status: Nasal cannula (5L) History of Recent Intubation: No Behavior/Cognition: Alert;Cooperative;Pleasant mood (HOH) Oral Cavity Assessment: Within Functional Limits Oral Care Completed by SLP: Yes Oral Cavity - Dentition: Adequate natural dentition Vision: Functional for self-feeding Self-Feeding Abilities: Able to feed self;Needs set up (Sitting up support) Patient Positioning: Upright in chair (supported better) Baseline Vocal Quality: Normal Volitional Cough:  Strong Volitional Swallow: Able to elicit    Oral/Motor/Sensory Function Overall Oral Motor/Sensory Function: Within functional limits   Ice Chips Ice chips: Within functional limits Presentation: Spoon (fed; 2 trials)   Thin Liquid Thin Liquid: Impaired Presentation: Cup;Self Fed (10+ trials - ~5 ozs total) Oral Phase Impairments:  (wfl) Pharyngeal  Phase Impairments: Throat Clearing - Immediate;Throat Clearing - Delayed;Cough - Immediate;Cough - Delayed (inconsistent and did not increase in frequency as trials continued)    Nectar Thick Nectar Thick Liquid: Within functional limits Presentation: Cup;Self Fed (2 trials) Other Comments: did NOT like and declined further   Honey Thick Honey Thick Liquid: Not tested   Puree Puree: Within functional limits Presentation: Spoon;Self Fed (~4 ozs) Other Comments: mild throat clear 1x   Solid     Solid: Within functional limits Presentation: Self Fed;Spoon (6+ trials - moistened) Other Comments: declined further         Comer Portugal, MS, CCC-SLP Speech Language Pathologist Rehab Services; [facility] [place] [phone] (ascom) Nikolina Simerson 05/03/2024,6:06 PM

## 2024-05-03 NOTE — Assessment & Plan Note (Addendum)
 Chest pain improved, troponin peaked at 18,600. Echocardiogram with regional wall motion abnormalities-consistent with NSTEMI. Family does not want any invasive procedure. - Cardiology is on board -Completed 48 hours of heparin  infusion and it has been now discontinued -Continue with medical management

## 2024-05-04 ENCOUNTER — Other Ambulatory Visit (HOSPITAL_COMMUNITY): Payer: Self-pay

## 2024-05-04 ENCOUNTER — Telehealth (HOSPITAL_COMMUNITY): Payer: Self-pay | Admitting: Pharmacy Technician

## 2024-05-04 DIAGNOSIS — Z7189 Other specified counseling: Secondary | ICD-10-CM | POA: Diagnosis not present

## 2024-05-04 DIAGNOSIS — J9691 Respiratory failure, unspecified with hypoxia: Secondary | ICD-10-CM | POA: Diagnosis not present

## 2024-05-04 DIAGNOSIS — I214 Non-ST elevation (NSTEMI) myocardial infarction: Secondary | ICD-10-CM | POA: Diagnosis not present

## 2024-05-04 DIAGNOSIS — I4891 Unspecified atrial fibrillation: Secondary | ICD-10-CM | POA: Diagnosis not present

## 2024-05-04 DIAGNOSIS — I482 Chronic atrial fibrillation, unspecified: Secondary | ICD-10-CM | POA: Diagnosis not present

## 2024-05-04 DIAGNOSIS — I4821 Permanent atrial fibrillation: Secondary | ICD-10-CM | POA: Diagnosis not present

## 2024-05-04 DIAGNOSIS — I509 Heart failure, unspecified: Secondary | ICD-10-CM | POA: Diagnosis not present

## 2024-05-04 DIAGNOSIS — Z515 Encounter for palliative care: Secondary | ICD-10-CM | POA: Diagnosis not present

## 2024-05-04 DIAGNOSIS — I255 Ischemic cardiomyopathy: Secondary | ICD-10-CM

## 2024-05-04 LAB — BASIC METABOLIC PANEL WITH GFR
Anion gap: 12 (ref 5–15)
BUN: 43 mg/dL — ABNORMAL HIGH (ref 8–23)
CO2: 29 mmol/L (ref 22–32)
Calcium: 8.6 mg/dL — ABNORMAL LOW (ref 8.9–10.3)
Chloride: 101 mmol/L (ref 98–111)
Creatinine, Ser: 0.9 mg/dL (ref 0.44–1.00)
GFR, Estimated: >60 mL/min (ref >60)
Glucose, Bld: 119 mg/dL — ABNORMAL HIGH (ref 70–99)
Potassium: 3.4 mmol/L — ABNORMAL LOW (ref 3.5–5.1)
Sodium: 142 mmol/L (ref 135–145)

## 2024-05-04 LAB — CBC
HCT: 43.5 % (ref 36.0–46.0)
Hemoglobin: 14 g/dL (ref 12.0–15.0)
MCH: 28.7 pg (ref 26.0–34.0)
MCHC: 32.2 g/dL (ref 30.0–36.0)
MCV: 89.1 fL (ref 80.0–100.0)
Platelets: 179 K/uL (ref 150–400)
RBC: 4.88 MIL/uL (ref 3.87–5.11)
RDW: 15.2 % (ref 11.5–15.5)
WBC: 9.9 K/uL (ref 4.0–10.5)
nRBC: 0 % (ref 0.0–0.2)

## 2024-05-04 LAB — GLUCOSE, CAPILLARY
Glucose-Capillary: 135 mg/dL — ABNORMAL HIGH (ref 70–99)
Glucose-Capillary: 153 mg/dL — ABNORMAL HIGH (ref 70–99)
Glucose-Capillary: 94 mg/dL (ref 70–99)
Glucose-Capillary: 102 mg/dL — ABNORMAL HIGH (ref 70–99)

## 2024-05-04 LAB — PROTIME-INR
INR: 1.3 — ABNORMAL HIGH (ref 0.8–1.2)
Prothrombin Time: 17.2 s — ABNORMAL HIGH (ref 11.4–15.2)

## 2024-05-04 LAB — HEPARIN LEVEL (UNFRACTIONATED): Heparin Unfractionated: 0.42 [IU]/mL (ref 0.30–0.70)

## 2024-05-04 MED ORDER — AMIODARONE HCL 200 MG PO TABS
200.0000 mg | ORAL_TABLET | Freq: Every day | ORAL | Status: DC
Start: 2024-05-18 — End: 2024-05-06

## 2024-05-04 MED ORDER — POTASSIUM CHLORIDE CRYS ER 20 MEQ PO TBCR
40.0000 meq | EXTENDED_RELEASE_TABLET | Freq: Once | ORAL | Status: AC
  Administered 2024-05-04: 40 meq via ORAL
  Filled 2024-05-04: qty 2

## 2024-05-04 MED ORDER — SENNA 8.6 MG PO TABS
1.0000 | ORAL_TABLET | Freq: Every day | ORAL | Status: DC | PRN
  Administered 2024-05-04: 8.6 mg via ORAL
  Filled 2024-05-04: qty 1

## 2024-05-04 MED ORDER — AMIODARONE HCL 200 MG PO TABS
400.0000 mg | ORAL_TABLET | Freq: Two times a day (BID) | ORAL | Status: DC
  Administered 2024-05-04 – 2024-05-06 (×4): 400 mg via ORAL
  Filled 2024-05-04 (×4): qty 2

## 2024-05-04 MED ORDER — AMIODARONE HCL 200 MG PO TABS: 200.0000 mg | ORAL_TABLET | Freq: Two times a day (BID) | ORAL | Status: DC

## 2024-05-04 MED ORDER — DIGOXIN 125 MCG PO TABS
0.1250 mg | ORAL_TABLET | Freq: Every day | ORAL | Status: DC
  Administered 2024-05-04 – 2024-05-06 (×3): 0.125 mg via ORAL
  Filled 2024-05-04 (×3): qty 1

## 2024-05-04 MED ORDER — DILTIAZEM HCL 25 MG/5ML IV SOLN
10.0000 mg | Freq: Once | INTRAVENOUS | Status: AC
  Administered 2024-05-04: 10 mg via INTRAVENOUS
  Filled 2024-05-04: qty 5

## 2024-05-04 MED ORDER — AMIODARONE HCL 200 MG PO TABS
400.0000 mg | ORAL_TABLET | Freq: Two times a day (BID) | ORAL | Status: DC
  Administered 2024-05-04: 400 mg via ORAL
  Filled 2024-05-04: qty 2

## 2024-05-04 MED ORDER — FUROSEMIDE 40 MG PO TABS
40.0000 mg | ORAL_TABLET | Freq: Every day | ORAL | Status: DC
  Administered 2024-05-05 – 2024-05-06 (×2): 40 mg via ORAL
  Filled 2024-05-04: qty 1
  Filled 2024-05-04: qty 2

## 2024-05-04 MED ORDER — DILTIAZEM LOAD VIA INFUSION
10.0000 mg | Freq: Once | INTRAVENOUS | Status: DC
  Filled 2024-05-04: qty 10

## 2024-05-04 MED ORDER — AMIODARONE HCL 200 MG PO TABS: 200.0000 mg | ORAL_TABLET | Freq: Every day | ORAL | Status: DC

## 2024-05-04 MED ORDER — ENOXAPARIN SODIUM 60 MG/0.6ML IJ SOSY
60.0000 mg | PREFILLED_SYRINGE | Freq: Two times a day (BID) | INTRAMUSCULAR | Status: DC
  Administered 2024-05-04 – 2024-05-06 (×5): 60 mg via SUBCUTANEOUS
  Filled 2024-05-04 (×6): qty 0.6

## 2024-05-04 MED ORDER — WARFARIN SODIUM 3 MG PO TABS
3.0000 mg | ORAL_TABLET | Freq: Once | ORAL | Status: AC
  Administered 2024-05-04: 3 mg via ORAL
  Filled 2024-05-04: qty 1

## 2024-05-04 MED ORDER — WARFARIN - PHARMACIST DOSING INPATIENT: Freq: Every day | Status: DC

## 2024-05-04 NOTE — Assessment & Plan Note (Signed)
 Renal function currently around baseline.  History of CKD stage III A.  - Monitor renal function while patient is being diuresed -Avoid nephrotoxins

## 2024-05-04 NOTE — Progress Notes (Signed)
 Rounding Note   Patient Name: Karen Cunningham Date of Encounter: 05/04/2024  [facility] Cardiologist: [name], MD   Subjective Laying in bed, comfortable, daughter and other family at the bedside Improved heart rate on amiodarone  infusion, low-dose digoxin , metoprolol  tartrate 50 twice daily Remains on IV Lasix  twice daily Oxygen nasal cannula being weaned downward, reports her breathing is improving Telemetry reviewed, atrial fibrillation rate 80-100  Scheduled Meds:  amiodarone   400 mg Oral BID   Followed by   NOREEN ON 05/11/2024] amiodarone   200 mg Oral BID   Followed by   NOREEN ON 05/18/2024] amiodarone   200 mg Oral Daily   atorvastatin   40 mg Oral QHS   Chlorhexidine  Gluconate Cloth  6 each Topical Daily   digoxin   0.125 mg Oral Daily   enoxaparin  (LOVENOX ) injection  60 mg Subcutaneous Q12H   feeding supplement (GLUCERNA SHAKE)  237 mL Oral TID BM   fluticasone   1 spray Each Nare Daily   furosemide   40 mg Intravenous BID   insulin  aspart  0-9 Units Subcutaneous TID WC   levothyroxine   88 mcg Oral Q0600   loratadine   5 mg Oral Daily   melatonin  10 mg Oral QHS   metoprolol  tartrate  50 mg Oral BID   pantoprazole   40 mg Oral Daily   sodium chloride  flush  3 mL Intravenous Q12H   warfarin  3 mg Oral ONCE-1600   Warfarin - Pharmacist Dosing Inpatient   Does not apply q1600   Continuous Infusions:   PRN Meds: acetaminophen , ALPRAZolam , ondansetron  (ZOFRAN ) IV, senna, sodium chloride  flush   Vital Signs  Vitals:   05/04/24 0700 05/04/24 0800 05/04/24 0908 05/04/24 1200  BP: 115/65 (!) 114/96  (!) 108/57  Pulse: (!) 52  (!) 113 (!) 108  Resp: (!) 25 15  18   Temp:  97.6 F (36.4 C)  97.7 F (36.5 C)  TempSrc:  Oral  Oral  SpO2: 99% 100%  96%  Weight:      Height:        Intake/Output Summary (Last 24 hours) at 05/04/2024 1321 Last data filed at 05/04/2024 0900 Gross per 24 hour  Intake 1273.73 ml  Output 1525 ml  Net -251.27 ml       05/04/2024    5:00 AM 05/03/2024    5:00 AM 05/02/2024    5:00 AM  Last 3 Weights  Weight (lbs) 132 lb 4.4 oz 128 lb 8.5 oz 132 lb 0.9 oz  Weight (kg) 60 kg 58.3 kg 59.9 kg      Telemetry Atrial fibrillation rate 80-100- Personally Reviewed  ECG   - Personally Reviewed  Physical Exam  Constitutional: Frail oriented to person, place, and time. No distress.  HENT:  Head: Grossly normal Eyes:  no discharge. No scleral icterus.  Neck: No JVD, no carotid bruits  Cardiovascular: Irregularly irregular, no murmurs appreciated Pulmonary/Chest: Clear to auscultation bilaterally, no wheezes or rails Abdominal: Soft.  no distension.  no tenderness.  Musculoskeletal: Normal range of motion Neurological:  normal muscle tone. Coordination normal. No atrophy Skin: Skin warm and dry Psychiatric: normal affect, pleasant   Labs High Sensitivity Troponin:   Recent Labs  Lab 05/01/24 0728 05/01/24 0903 05/02/24 0621  TROPONINIHS 18,600* 14,167* 11,310*     Chemistry Recent Labs  Lab 05/01/24 0728 05/01/24 0903 05/02/24 0339 05/03/24 0455 05/04/24 0522  NA 144  --  142 139 142  K 3.6  --  4.4 4.2 3.4*  CL 109  --  106 100 101  CO2 24  --  22 26 29   GLUCOSE 263*  --  141* 127* 119*  BUN 19  --  28* 39* 43*  CREATININE 0.98  --  0.93 1.01* 0.90  CALCIUM  9.1  --  9.4 9.0 8.6*  MG  --  2.0  --   --   --   PROT 8.0  --  7.1  --   --   ALBUMIN 4.1  --  3.5  --   --   AST 105*  --  97*  --   --   ALT 47*  --  41  --   --   ALKPHOS 131*  --  96  --   --   BILITOT 0.9  --  1.2  --   --   GFRNONAA 54*  --  58* 53* >60  ANIONGAP 11  --  14 13 12     Lipids No results for input(s): CHOL, TRIG, HDL, LABVLDL, LDLCALC, CHOLHDL in the last 168 hours.  Hematology Recent Labs  Lab 05/02/24 0339 05/03/24 0455 05/04/24 0522  WBC 12.6* 15.3* 9.9  RBC 4.50 4.83 4.88  HGB 12.9 13.7 14.0  HCT 40.7 44.7 43.5  MCV 90.4 92.5 89.1  MCH 28.7 28.4 28.7  MCHC 31.7 30.6 32.2  RDW  15.2 15.4 15.2  PLT 197 203 179   Thyroid   Recent Labs  Lab 05/01/24 0903  TSH 3.030    BNP Recent Labs  Lab 05/01/24 0728  BNP 955.8*    DDimer No results for input(s): DDIMER in the last 168 hours.   Radiology  No results found.   Cardiac Studies  05/01/2024 Echo complete 1. Left ventricular ejection fraction, by estimation, is 25 to 30%. The  left ventricle has severely decreased function. The left ventricle  demonstrates regional wall motion abnormalities (see scoring  diagram/findings for description). Left ventricular  diastolic function could not be evaluated.   2. Right ventricular systolic function is moderately reduced. The right  ventricular size is normal. There is normal pulmonary artery systolic  pressure.   3. Left atrial size was moderately dilated.   4. Right atrial size was severely dilated.   5. Moderate pleural effusion in the left lateral region.   6. The mitral valve is degenerative. Mild to moderate mitral valve  regurgitation. Severe mitral annular calcification.   7. Tricuspid valve regurgitation is mild to moderate.   8. The aortic valve has an indeterminant number of cusps. There is mild  calcification of the aortic valve. There is moderate thickening of the  aortic valve. Aortic valve regurgitation is not visualized. Moderate to  severe low-flow/low-gradient aortic  valve stenosis. Aortic valve area, by VTI measures 0.55 cm. Aortic valve  mean gradient measures 11.0 mmHg. DI 0.24.   9. The inferior vena cava is normal in size with <50% respiratory  variability, suggesting right atrial pressure of 8 mmHg.    Patient Profile   89 y.o. female  with a hx of CAD s/p PCI to the mid LAD in 2010, chronic HFpEF, permanent atrial fibrillation on Coumadin , carotid artery disease, mild aortic valve stenosis, hypertension, hyperlipidemia, CKD, hypothyroidism, and chronic lower extremity edema who was admitted 8/11 with dyspnea, who we are seeing for  the ongoing management of atrial fibrillation with RVR, NSTEMI, and newly reduced LV systolic function.    Assessment & Plan    Permanent atrial fibrillation  Atrial fibrillation with RVR -Difficult to control rate in the  setting of non-STEMI, cardiomyopathy -Will transition amiodarone  infusion to 400 twice daily Continue metoprolol  tartrate 50 twice daily, digoxin  oral dosing 0.125 daily - If rate runs high may need to increase metoprolol  - For the most part she is relatively asymptomatic, sedentary at baseline, predominantly homebound  NSTEMI mild chest discomfort on 8/9 which resolved without intervention - Troponin 81399 > 1416 > 11310 - EKG without acute ischemic changes - Echo shows severely reduced LV function with EF 25-30% and regional wall motion abnormalities, - After discussion with interventional cardiology, plan for conservative/medical management secondary to frail nature, age - Continue heparin  infusion with transition back onto warfarin Could not afford her deductible in the past   Heart failure with newly reduced EF - Prior echo with EF 55-60% - Echo this admission shows EF 25-30% - BNP 955 - CXR with findings concerning for pulmonary edema and small pleural effusions -Recommend we continue IV Lasix  40 twice daily GDMT on hold secondary to hypotension and need to uptitrate metoprolol  as blood pressure will permit   Acute hypoxic respiratory failure - Likely due to volume overload in the setting of cardiomyopathy, atrial fibrillation with RVR  - Continue IV Lasix  this morning, hold p.m. dose given climbing BUN and improving respiratory status -Will  transition to oral Lasix  40 daily tomorrow  For questions or updates, please contact [facility] Please consult [email or web address] for contact info under     Signed, Montoya Brandel, MD  05/04/2024, 1:21 PM

## 2024-05-04 NOTE — Consult Note (Addendum)
 PHARMACY - ANTICOAGULATION CONSULT NOTE  Pharmacy Consult for warfarin re-initiation Indication: atrial fibrillation  Allergies  Allergen Reactions   Codeine Nausea And Vomiting   Nsaids     Dr instructed to avoid due to kidney issues    Ibuprofen Other (See Comments)    Dr instructed to avoid due to kidney issues    Prednisone Other (See Comments)    ELEVATED BLOOD PRESSURE.    Patient Measurements: Height: 5' (152.4 cm) Weight: 60 kg (132 lb 4.4 oz) IBW/kg (Calculated) : 45.5 HEPARIN  DW (KG): 50.1  Vital Signs: Temp: 97.6 F (36.4 C) (08/14 0800) Temp Source: Oral (08/14 0800) BP: 114/96 (08/14 0800) Pulse Rate: 52 (08/14 0700)  Labs: Recent Labs    05/01/24 0903 05/01/24 1954 05/02/24 0339 05/02/24 0621 05/03/24 0455 05/03/24 1512 05/03/24 2235 05/04/24 0522  HGB  --    < > 12.9  --  13.7  --   --  14.0  HCT  --   --  40.7  --  44.7  --   --  43.5  PLT  --   --  197  --  203  --   --  179  APTT 29  --   --   --   --   --   --   --   LABPROT  --   --  20.3*  --   --   --   --   --   INR  --   --  1.6*  --   --   --   --   --   HEPARINUNFRC  --    < > 0.47  --  0.11* 0.43 0.30  --   CREATININE  --   --  0.93  --  1.01*  --   --  0.90  CKTOTAL 660*  --   --   --   --   --   --   --   TROPONINIHS 14,167*  --   --  11,310*  --   --   --   --    < > = values in this interval not displayed.    Estimated Creatinine Clearance: 33 mL/min (by C-G formula based on SCr of 0.9 mg/dL).   Medical History: Past Medical History:  Diagnosis Date   (HFpEF) heart failure with preserved ejection fraction (HCC)    a. 07/2016 Echo: EF 51%; b. 12/2016 Echo: EF 41%. Mod TR, mild PR; c. 09/2017 Echo [facility]): EF 62%, Sev BAE. Mod TR, mild PR; d. 03/2019 Echo([facility]): EF 57%, Gr3 DD. Sev dil LA, mild dil RA. Midly dil Asc Ao. Trace AI. Mild to mod TR, mild PR. RVSP .   Anemia    Arthritis    Carotid arterial disease (HCC)    a. 10/2013 Mod to Sev RICA dzs. Mild dzs in L bulb; b.  10/2013 CTA Carotids: LICA 50p, RICA w/o stenosis.   CKD (chronic kidney disease), stage III (HCC)    Coronary artery disease    a. Several neg MV between 2008 and 2010; b. 2010 s/p PCI/DES to the mLAD; c. 10/2013 MV: Fixed apical defect, likely breast attenuation, no ischemia; d. 09/2014 MV: No ischemia; e. 03/2018 MV [facility]): no ischemia.   GERD (gastroesophageal reflux disease)    HLD (hyperlipidemia)    Hypertension    Hypothyroidism    Permanent atrial fibrillation (HCC)    a. Dx ~ 2017. CHA2DS2VASc = 6-->coumadin .    Medications:  Scheduled:  amiodarone   400 mg Oral BID   Followed by   NOREEN ON 05/07/2024] amiodarone   200 mg Oral BID   Followed by   NOREEN ON 05/11/2024] amiodarone   200 mg Oral Daily   atorvastatin   40 mg Oral QHS   Chlorhexidine  Gluconate Cloth  6 each Topical Daily   digoxin   0.125 mg Oral Daily   feeding supplement (GLUCERNA SHAKE)  237 mL Oral TID BM   fluticasone   1 spray Each Nare Daily   furosemide   40 mg Intravenous BID   insulin  aspart  0-9 Units Subcutaneous TID WC   levothyroxine   88 mcg Oral Q0600   loratadine   5 mg Oral Daily   melatonin  10 mg Oral QHS   metoprolol  tartrate  50 mg Oral BID   pantoprazole   40 mg Oral Daily   potassium chloride   40 mEq Oral Once   sodium chloride  flush  3 mL Intravenous Q12H   Infusions:   heparin  900 Units/hr (05/04/24 0705)   PRN: acetaminophen , ALPRAZolam , ondansetron  (ZOFRAN ) IV, senna, sodium chloride  flush  Assessment: 89 year old female who presented with NSTEMI and has chronic Afib in RVR. CHADVASC score of 6. Notable PMH hypothyroidism, CKD stage III, HFrEF, HTN. Currently receiving heparin  infusion for anticoagulation. Renal function at baseline with Scr of 0.9 mg/dL (CrCl 33 mL/min, GFR > 60 mL/min). CBC stable with hemoglobin of 14 g/dL and platelet count of 820 K/uL.   Patient takes warfarin 2 mg daily at home. Upon presentation initial INR was subtherapeutic at 1.7. Of note, amiodarone  was started  with this admission which will increase INR. Other notable warfarin interactions with atorvastatin  (increased INR), and levothyroxine  (increased INR). Indicated for enoxaparin  bridge due to high stroke risk per CHADVASc score.   Goal of Therapy:  INR 2-3 Monitor platelets by anticoagulation protocol: Yes   Plan:  - Give warfarin 3 mg PO x 1. Will likely need lower warfarin dose tomorrow given new amiodarone  interaction.  - Collect baseline INR value and monitor daily with AM labs.  - Start enoxaparin  60 mg subcut BID until INR > 2 or 5 days of warfarin therapy completed  - Stop heparin  gtt when initiating enoxaparin  - Monitor CBC daily for hemoglobin and platelet count   Rhilee Currin [place] - PharmD Candidate  05/04/2024,8:59 AM

## 2024-05-04 NOTE — TOC Progression Note (Signed)
 Transition of Care [facility]) - Progression Note    Patient Details  Name: KAREMA TOCCI MRN: [id number] Date of Birth: August 01, 1931  Transition of Care [facility]) CM/SW Contact  K'La JINNY Ruts, LCSW Phone Number: 05/04/2024, 4:11 PM  Clinical Narrative:    Chart reviewed. I went and spoke to the patient son at bedside today. The patient son informed me that the family has not come up with a final decision on level of care. The patient daughter in law left me a messaging stating that she would be trying to find a placement for the patient and will be consulting with someone in the [facility]. I called the daughter in law back no answer. I will follow up with the family at a later time.       Barriers to Discharge: Continued Medical Work up               Expected Discharge Plan and Services       Living arrangements for the past 2 months: Single Family Home                                       Social Drivers of Health (SDOH) Interventions SDOH Screenings   Food Insecurity: No Food Insecurity (05/01/2024)  Housing: Low Risk  (05/01/2024)  Transportation Needs: No Transportation Needs (05/01/2024)  Utilities: Not At Risk (05/01/2024)  Social Connections: Socially Isolated (05/01/2024)  Tobacco Use: Low Risk  (05/01/2024)    Readmission Risk Interventions     No data to display

## 2024-05-04 NOTE — Plan of Care (Signed)
  Problem: Fluid Volume: Goal: Ability to maintain a balanced intake and output will improve Outcome: Progressing   Problem: Activity: Goal: Risk for activity intolerance will decrease Outcome: Progressing   Problem: Coping: Goal: Level of anxiety will decrease Outcome: Progressing   Problem: Elimination: Goal: Will not experience complications related to urinary retention Outcome: Progressing

## 2024-05-04 NOTE — TOC Progression Note (Signed)
 Transition of Care [facility]) - Progression Note    Patient Details  Name: Karen Cunningham MRN: [id number] Date of Birth: June 20, 1931  Transition of Care [facility]) CM/SW Contact  Karen JINNY Ruts, LCSW Phone Number: 05/04/2024, 4:20 PM  Clinical Narrative:    Chart reviewed. I spoke with the patient daughter in law. The daughter in law reports that she is wanting to take the patient home and want to create a 24/7 care team. The daughter in law does not want to put the patient in a SNF. However, the patient daughter in law is willing to look over the SNF options. I will provide the SNF list.      Barriers to Discharge: Continued Medical Work up               Expected Discharge Plan and Services       Living arrangements for the past 2 months: Single Family Home                                       Social Drivers of Health (SDOH) Interventions SDOH Screenings   Food Insecurity: No Food Insecurity (05/01/2024)  Housing: Low Risk  (05/01/2024)  Transportation Needs: No Transportation Needs (05/01/2024)  Utilities: Not At Risk (05/01/2024)  Social Connections: Socially Isolated (05/01/2024)  Tobacco Use: Low Risk  (05/01/2024)    Readmission Risk Interventions     No data to display

## 2024-05-04 NOTE — Telephone Encounter (Signed)
 Patient Product/process development scientist completed.    The patient is insured through [employer]. Patient has Medicare and is not eligible for a copay card, but may be able to apply for patient assistance or Medicare RX Payment Plan (Patient Must reach out to their plan, if eligible for payment plan), if available.    Ran test claim for Eliquis 5 mg and the current 30 day co-pay is $479.24 due to a $590.00 deductible.   This test claim was processed through [facility]- copay amounts may vary at other pharmacies due to pharmacy/plan contracts, or as the patient moves through the different stages of their insurance plan.     Reyes Sharps, CPHT Pharmacy Technician III Certified Patient Advocate [facility] Patient Advocate Team Direct Number: [phone]  Fax: [phone]

## 2024-05-04 NOTE — Assessment & Plan Note (Signed)
 No prior diagnosis of diabetes, A1c of 6.2. - Continue to monitor

## 2024-05-04 NOTE — Progress Notes (Signed)
 Palliative:  HPI: 89 y.o. female  with past medical history of HFpEF, chronic Afib on warfarin, CKD stage 3b, CAD s/p stent, hypothyroidism admitted on 05/01/2024 with chest pain and shortness of breath with NSTEMI, declined EF, unstable Afib, respiratory distress.   I met today at Karen Cunningham's bedside along with 2 daughter-in-laws. They share concerns from care yesterday but overall have been very happy with care. They report very good night last night and good sleep. Karen Cunningham is in much improved spirits today and eating well. Still with cough and congestion which Karen Cunningham shares that she has all the time prior to this hospital stay. Requiring less oxygen. HR more stable. They are hopeful for ongoing improvement but also aware of the significant damage and issues going on with Karen Cunningham's heart.   Karen Cunningham has been dreading discussion of SNF rehab with Karen Cunningham but also fears she cannot provide her care at home in her current condition. She is trying to think through the options knowing Karen Cunningham would like to be at home. There are many anxieties for SNF rehab as well as home. She I did share that we have moved to by mouth medications and HR and oxygen levels are more stable. I encouraged that it is time to begin thinking and planning for the next steps. Karen Cunningham would like to discuss this further this afternoon or tomorrow.   Update: I returned to room but Karen Cunningham is not present at that time. I will follow up tomorrow for support and ongoing discussions tomorrow.   All questions/concerns addressed. Emotional support provided.   Exam: Alert, mostly oriented (a little forgetful - repeats herself at times). No distress. HR tachy at times, irreg, irreg 90s. Abd distended - has had laxative. Moves all extremities.   Plan:  - DNR/DNI - Ongoing palliative support - Family considering d/c options  50 min  Bernarda Kitty, NP Palliative Medicine Team Pager [phone] (Please see [email or web address] for schedule) Team  Phone [phone]

## 2024-05-04 NOTE — Progress Notes (Signed)
 Occupational Therapy Treatment Patient Details Name: Karen Cunningham MRN: [id number] DOB: 02/28/1931 Today's Date: 05/04/2024   History of present illness 89 y/o female presented to ED on 05/01/24 for respiratory distress and chest pain. Admitted for NSTEMI and acute on chronic HFpEF decompensation. PMH: Afib, HFpEF, CKD stage IIIb, CAD s/p stent   OT comments  Chart reviewed to date, pt greeted in chair, agreeable to OT tx session targeting improving functional activity tolerance in prep for ADL tasks. Pt continues to make progress towards goals, however does still continue to require increased time and assist for mobilization attempts with RW (pt amb with SPC at baseline) and ADLs. If pt does discharge home, would recommend increased family assist for ADL/IADL/safe mobility. Pt is left as received, all needs met. OT will follow.       If plan is discharge home, recommend the following:  A lot of help with walking and/or transfers;A lot of help with bathing/dressing/bathroom;Supervision due to cognitive status   Equipment Recommendations  BSC/3in1;Wheelchair (measurements OT)    Recommendations for Other Services      Precautions / Restrictions Precautions Precautions: Fall Recall of Precautions/Restrictions: Impaired Restrictions Weight Bearing Restrictions Per Provider Order: No       Mobility Bed Mobility               General bed mobility comments: NT in recliner pre/post session    Transfers Overall transfer level: Needs assistance Equipment used: Rolling walker (2 wheels) Transfers: Sit to/from Stand Sit to Stand: Contact guard assist, Min assist, +2 safety/equipment, +2 physical assistance                 Balance Overall balance assessment: Needs assistance Sitting-balance support: Feet supported Sitting balance-Leahy Scale: Fair     Standing balance support: Bilateral upper extremity supported, During functional activity, Reliant on assistive device  for balance Standing balance-Leahy Scale: Fair                             ADL either performed or assessed with clinical judgement   ADL Overall ADL's : Needs assistance/impaired             Lower Body Bathing: Maximal assistance;Sit to/from stand   Upper Body Dressing : Minimal assistance;Sitting   Lower Body Dressing: Maximal assistance;Sitting/lateral leans   Toilet Transfer: Rolling walker (2 wheels);Minimal assistance;Ambulation   Toileting- Clothing Manipulation and Hygiene: Maximal assistance;Sit to/from stand       Functional mobility during ADLs: Contact guard assist;Rolling walker (2 wheels);Cueing for sequencing;+2 for safety/equipment (approx 100' with close chair follow)      Extremity/Trunk Assessment              Vision       Perception     Praxis     Communication Communication Communication: Impaired Factors Affecting Communication: Hearing impaired   Cognition Arousal: Alert Behavior During Therapy: WFL for tasks assessed/performed Cognition: Cognition impaired   Orientation impairments: Situation   Memory impairment (select all impairments): Declarative long-term memory, Short-term memory, Working memory Attention impairment (select first level of impairment): Sustained attention Executive functioning impairment (select all impairments): Sequencing, Reasoning, Problem solving                   Following commands: Impaired Following commands impaired: Follows one step commands with increased time      Cueing   Cueing Techniques: Verbal cues, Gestural cues, Visual cues, Tactile cues  Exercises  Other Exercises Other Exercises: edu pt/DIL re: role of rehab, equipment needs for safe ADL completion    Shoulder Instructions       General Comments HR to max 127 bpm; spo2 appears >90% on 2 L throughout when pleth is accurate, no SOB noted    Pertinent Vitals/ Pain       Pain Assessment Pain Assessment: No/denies  pain  Home Living                                          Prior Functioning/Environment              Frequency  Min 3X/week        Progress Toward Goals  OT Goals(current goals can now be found in the care plan section)  Progress towards OT goals: Progressing toward goals  Acute Rehab OT Goals Time For Goal Achievement: 05/17/24  Plan      Co-evaluation    PT/OT/SLP Co-Evaluation/Treatment: Yes Reason for Co-Treatment: For patient/therapist safety;Necessary to address cognition/behavior during functional activity;Complexity of the patient's impairments (multi-system involvement);To address functional/ADL transfers   OT goals addressed during session: ADL's and self-care      AM-PAC OT 6 Clicks Daily Activity     Outcome Measure   Help from another person eating meals?: None Help from another person taking care of personal grooming?: A Little Help from another person toileting, which includes using toliet, bedpan, or urinal?: A Lot Help from another person bathing (including washing, rinsing, drying)?: A Lot Help from another person to put on and taking off regular upper body clothing?: A Little Help from another person to put on and taking off regular lower body clothing?: A Lot 6 Click Score: 16    End of Session Equipment Utilized During Treatment: Rolling walker (2 wheels);Oxygen  OT Visit Diagnosis: Other abnormalities of gait and mobility (R26.89);Muscle weakness (generalized) (M62.81)   Activity Tolerance Patient tolerated treatment well   Patient Left in chair;with call bell/phone within reach;with family/visitor present   Nurse Communication Mobility status        Time: 8949-8877 OT Time Calculation (min): 32 min  Charges: OT General Charges $OT Visit: 1 Visit OT Treatments $Therapeutic Activity: 8-22 mins  Therisa Sheffield, OTD OTR/L  05/04/24, 11:40 AM

## 2024-05-04 NOTE — Progress Notes (Signed)
 Progress Note   [name] FMW:[id number] DOB: 01-10-1931 DOA: 05/01/2024     3 DOS: the patient was seen and examined on 05/04/2024   Brief hospital course: Taken from prior notes.  Karen Cunningham is a 89 y.o. female with medical history significant of chronic A-fib on Coumadin , HFpEF, CKD stage III b, CAD status post stenting in 2008 in 2010, hypothyroidism, Presented with new onset chest pain and shortness of breath and palpitations.  On presentation patient was found to be in A-fib with RVR, heart rate up to 170, hypoxic and she was placed on BiPAP for increased work of breathing.  Chest x-ray with pulmonary edema, mild leukocytosis and transaminitis.  T. bili above 18,000.  Cardiology was consulted and patient was started on heparin  infusion, received IV Lasix  and metoprolol  pushes.  As heart rate remained uncontrolled patient was started on amiodarone  infusion and received 2 doses of digoxin . Cardiology was suggesting cardiac cath after improving respiratory status but family would like to do noninvasive management only  8/12: Patient was able to wean to 10 L of oxygen from BiPAP but remained tachycardic and tachypneic, heart rate remained uncontrolled and mostly in 150s, labs with slight worsening of leukocytosis at 12.6, troponin peaked at 18,600. Echocardiogram with new diagnosis of HFrEF, EF of 25 to 30%, did show regional wall motion abnormalities.  Also shows moderately reduced RV function and normal pulmonary arterial pressure, moderate to severely dilated both atrium, moderate pleural effusion on left, mild to moderate MR, moderate to severe AS.   Palliative care was also consulted to discuss goals of care, patient with poor prognosis.  CODE STATUS changed to DNR and to continue current level of care.  8/13: Currently stable but continued to need 10 L of oxygen, heart rate now in the low 100s.  Worsening leukocytosis likely reactive secondary to NSTEMI, slight  increase in BUN to 39 and creatinine to 1.01.  Continuing current management.  Digoxin  was added by cardiology.  Family would like to continue current level of care without any escalation at this time and they will decide in next couple of days depending on which direction she will go.  8/14: Hemodynamic stable and improving oxygen requirement, currently on 2 L of oxygen.  PT is recommending SNF-patient wants to go home, family has not final decision regarding disposition.  If remains stable likely can be discharged tomorrow. Heparin  infusion was discontinued and patient is now back on warfarin, amiodarone  infusion has been switched with p.o.  Assessment and Plan: * NSTEMI (non-ST elevated myocardial infarction) (HCC) Chest pain improved, troponin peaked at 18,600. Echocardiogram with regional wall motion abnormalities-consistent with NSTEMI. Family does not want any invasive procedure. - Cardiology is on board -Completed 48 hours of heparin  infusion and it has been now discontinued -Continue with medical management  Acute on chronic HFrEF (heart failure with reduced ejection fraction) (HCC) Likely secondary to NSTEMI, echocardiogram with EF of 25 to 30% with regional wall motion abnormalities, biventricular failure and biatrial dilatation with moderate MR, TR and moderate to severe AS. EF was normal a month ago and it was mild MR, TR and AS. BNP elevated and chest x-ray with concern of pulmonary vascular congestion. - Continue with IV Lasix  -Daily weight and BMP -Strict intake and output -Daily weight and BMP -Strict intake and output  Chronic atrial fibrillation with RVR (HCC) Heart rate slowly improving mostly in high 90s -Continue with digoxin  -Amiodarone  infusion has been switched with p.o. amiodarone  400 mg  twice daily -Continue with metoprolol  -Continue to monitor   Acute respiratory failure with hypoxia and hypercapnia (HCC) Initially requiring BiPAP, VBG with concern of  hypoxia and hypercapnia.  Repeat VBG with improvement in hypercapnia and pH.  No baseline oxygen use,  Slowly improving, patient currently on 2 L and saturating well -Continue supplemental oxygen-wean as tolerated  Transaminitis Likely secondary to hepatic congestion with acute heart failure. - Continue to monitor  CKD (chronic kidney disease), stage III (HCC) Renal function currently around baseline.  History of CKD stage III A.  - Monitor renal function while patient is being diuresed -Avoid nephrotoxins  Hypertension Continue with metoprolol  and Lasix   Hypothyroidism TSH normal. - Continue with home Synthroid   Hyperglycemia No prior diagnosis of diabetes, A1c of 6.2. - Continue to monitor  Dyslipidemia - Continue with statin  GERD (gastroesophageal reflux disease) - Continue with PPI   Subjective: Patient was sitting comfortably in chair when seen today.  She wants to go home.  Denies any chest pain.  Physical Exam: Vitals:   05/04/24 0908 05/04/24 1200 05/04/24 1300 05/04/24 1400  BP:  (!) 108/57 113/70 (!) 120/95  Pulse: (!) 113 (!) 108 (!) 50 87  Resp:  18 (!) 24 19  Temp:  97.7 F (36.5 C)    TempSrc:  Oral    SpO2:  96% 98% 99%  Weight:      Height:       General.  Frail elderly lady, in no acute distress. Pulmonary.  Lungs clear bilaterally, normal respiratory effort. CV.  Irregularly irregular Abdomen.  Soft, nontender, nondistended, BS positive. CNS.  Alert and oriented .  No focal neurologic deficit. Extremities.  No edema, no cyanosis, pulses intact and symmetrical. Psychiatry.  Judgment and insight appears normal.   Data Reviewed: Prior data reviewed  Family Communication: DIL at bedside  Disposition: Status is: Inpatient Remains inpatient appropriate because: Severity of illness  Planned Discharge Destination: To be determined  DVT prophylaxis.  Heparin  infusion Time spent: 50 minutes  This record has been created using Software engineer. Errors have been sought and corrected,but may not always be located. Such creation errors do not reflect on the standard of care.   Author: Amaryllis Dare, MD 05/04/2024 2:59 PM  For on call review [email or web address].

## 2024-05-04 NOTE — Progress Notes (Signed)
 RN notified Dr. Caleen HR is currently a'fib sustaining in the 140's. Per MD RN to give one time dose of 10 mg of iv Cardizem .

## 2024-05-04 NOTE — Progress Notes (Addendum)
 Speech Language Pathology Treatment: Dysphagia  Patient Details Name: Karen Cunningham MRN: [id number] DOB: March 30, 1931 Today's Date: 05/04/2024 Time: 1550-1640 SLP Time Calculation (min) (ACUTE ONLY): 50 min  Assessment / Plan / Recommendation Clinical Impression  Met w/ pt and Son, Family in room this afternoon. Pt seen for ongoing assessment of swallowing and toleration of current diet; discussion on swallowing/dysphagia as well as aspiration precautions to lessen risk for aspiration. Also discussed, modeled, and practiced was the use of Pulmonary exs including via an Facilities manager and a Flutter Valve- discussed the importance of more Pulmonary stamina and breathing in setting of any potential sedentary status and potential penetration/aspiration occurring. Pt demonstrated appropriate follow through and use of each device given model and instruction. Son on board too and eager for pt to exercise her breathing w/ them.  Pt awake, sitting in chair. Verbal and enjoyed conversation w/ this SLP. Pt is HOH. Noted mildly wet vocal quality at Baseline during converation PRIOR TO any po's given. Pt c/o less Phlegm today. Ongoing tx for increased Pulmonary fluid currently. Pt has acute on chronic heart failure; remains on IV Lasix  40 twice daily, feels her breathing is slowly improving per MD notes.  Pt and Family deny any gross swallowing problems at home prior but seemed to endorse she may have had a little coughing at meals; pt agreed when asked further and pointed to her mid-sternum stating it's probably d/t this right here (Nissen?).  Pt on 2L; WBC WNL. Afebrile.   Pt appears to present w/ min dysphagia in setting of Pulmonary illness/decline and hospitalization, w/ suspected potential for pharyngeal phase swallow. No oral phase dysphagia noted. No muscular deficits noted; potential sensory deficits/delay of pharyngeal swallow timing(?). Pt consumed po trials w/ inconsistent, min throat  clearing w/ trials of thin liquids- no decline in her Pulmonary presentation during/post. No other overt, clinical s/s of aspiration during po trials.  Pt appears at min risk for aspiration/aspiration pneumonia but this risk is reduced when following general aspiration precautions w/ oral intake. Pt does have challenging factors that could impact her oropharyngeal swallowing to include CardioPulmonary decline at Baseline, deconditioning/weakness, and advanced age(89y). These factors can increase risk for aspiration, dysphagia as well as decreased oral intake overall.    During po trials, pt exhibited the mild, inconsistent throat clearing/cough w/ sips of thin liquids via Cup; aspiration precautions followed. No gross coughing, no decline in vocal quality, nor change in respiratory presentation during/post trials- O2 sats remained 99-100%. No immediate s/s of aspiration noted w/ ice chips. No oral phase deficits noted. Speech Clear. Pt fed self w/ setup support.    Recommend continue a fairly Regular consistency diet w/ well-Cut meats, moistened foods; Thin liquids -- carefully monitor Cup drinking for Small, Single Sips. Recommend general aspiration precautions, Reduce distractions and No Talking during oral intake. Rest Breaks as needed to avoid any WOB during meals. Pills WHOLE in Puree for safer, easier swallowing -- this was encouraged now and for D/C to the Son.    Education given on Pills in Puree; food consistencies and easy to eat options; aspiration precautions to pt and Son.  ST services can be available to provide education as needs indicate; ongoing discussion of swallowing and her risk factors w/ Palliative Care, Family if needed.  Pt and Son, Family requested to continue w/ thin liquids in pt's diet; NOT thickened liquids at this time. Diet order confirmed w/ pt/Son and NSG/chart. MD updated. Recommend Dietician f/u for support. Recommend continued  Palliative Care f/u for  GOC/support.        HPI HPI: Pt is a 89 y.o. female with medical history significant of Nissen Fundoplication ~40 years ago per pt/Family, chronic A-fib on Coumadin , HFpEF, CHF, CKD stage III b, CAD status post stenting in 2008 in 2010, hypothyroidism,  Presented with new onset chest pain and shortness of breath and palpitations.     Symptoms started yesterday, patient started complaining about intermittent pressure-like chest pain, associated with shortness of breath and malaise and belly bloating worsening with activity, overnight patient started develop severe shortness of breath and could not lie flat on the bed, sitting and walking around to find a comfortable position.  Denies any fever chills no dysuria no diarrhea or nauseous vomiting.  She occasionally feels palpitations only in certain body position.  No headache or vision changes.  Family called EMS this morning EMS found patient heart rate in 170s and hypoxic and placed patient on CPAP.  Pt has acute on chronic heart failure; remains on IV Lasix  40 twice daily, feels her breathing is slowly improving per MD notes.   CXR IMaging this admit: Diffusely increased interstitial opacities, which may represent  pulmonary edema or atypical infection.  2. Similar blunting of bilateral costophrenic angles, which may  represent small pleural effusions.      SLP Plan  Continue with current plan of care          Recommendations  Diet recommendations: Regular;Thin liquid (cut meats, gravies added) Liquids provided via: Cup;No straw Medication Administration: Whole meds with puree Supervision: Patient able to self feed;Intermittent supervision to cue for compensatory strategies (setup) Compensations: Minimize environmental distractions;Slow rate;Small sips/bites;Lingual sweep for clearance of pocketing;Multiple dry swallows after each bite/sip;Follow solids with liquid;Clear throat intermittently Postural Changes and/or Swallow Maneuvers: Out  of bed for meals;Seated upright 90 degrees;Upright 30-60 min after meal (REFLUX precs)                 (Palliative Care f/u at next venue of care for education on advanced aging (on swallowing)) Oral care BID;Patient independent with oral care (setup)   Intermittent Supervision/Assistance Dysphagia, unspecified (R13.10) (declined Pulmonary status currently; advanced Age)     Continue with current plan of care        Comer Portugal, MS, CCC-SLP Speech Language Pathologist Rehab Services; [facility] [place] [phone] (ascom) Oyuki Hogan  05/04/2024, 4:48 PM

## 2024-05-04 NOTE — Consult Note (Signed)
 PHARMACY - ANTICOAGULATION CONSULT NOTE  Pharmacy Consult for heparin  Indication: chest pain/ACS  Allergies  Allergen Reactions   Codeine Nausea And Vomiting   Nsaids     Dr instructed to avoid due to kidney issues    Ibuprofen Other (See Comments)    Dr instructed to avoid due to kidney issues    Prednisone Other (See Comments)    ELEVATED BLOOD PRESSURE.    Patient Measurements: Height: 5' (152.4 cm) Weight: 58.3 kg (128 lb 8.5 oz) IBW/kg (Calculated) : 45.5 HEPARIN  DW (KG): 50.1  Vital Signs: Temp: 97.9 F (36.6 C) (08/14 0010) Temp Source: Oral (08/14 0010) BP: 92/56 (08/14 0010) Pulse Rate: 62 (08/14 0010)  Labs: Recent Labs    05/01/24 0728 05/01/24 0903 05/01/24 1954 05/02/24 0339 05/02/24 0621 05/03/24 0455 05/03/24 1512 05/03/24 2235  HGB 14.1  --   --  12.9  --  13.7  --   --   HCT 46.1*  --   --  40.7  --  44.7  --   --   PLT 235  --   --  197  --  203  --   --   APTT  --  29  --   --   --   --   --   --   LABPROT 20.6*  --   --  20.3*  --   --   --   --   INR 1.7*  --   --  1.6*  --   --   --   --   HEPARINUNFRC  --   --    < > 0.47  --  0.11* 0.43 0.30  CREATININE 0.98  --   --  0.93  --  1.01*  --   --   CKTOTAL  --  660*  --   --   --   --   --   --   TROPONINIHS 18,600* 14,167*  --   --  11,310*  --   --   --    < > = values in this interval not displayed.    Estimated Creatinine Clearance: 29 mL/min (A) (by C-G formula based on SCr of 1.01 mg/dL (H)).   Medical History: Past Medical History:  Diagnosis Date   (HFpEF) heart failure with preserved ejection fraction (HCC)    a. 07/2016 Echo: EF 51%; b. 12/2016 Echo: EF 41%. Mod TR, mild PR; c. 09/2017 Echo [facility]): EF 62%, Sev BAE. Mod TR, mild PR; d. 03/2019 Echo([facility]): EF 57%, Gr3 DD. Sev dil LA, mild dil RA. Midly dil Asc Ao. Trace AI. Mild to mod TR, mild PR. RVSP .   Anemia    Arthritis    Carotid arterial disease (HCC)    a. 10/2013 Mod to Sev RICA dzs. Mild dzs in L bulb; b. 10/2013  CTA Carotids: LICA 50p, RICA w/o stenosis.   CKD (chronic kidney disease), stage III (HCC)    Coronary artery disease    a. Several neg MV between 2008 and 2010; b. 2010 s/p PCI/DES to the mLAD; c. 10/2013 MV: Fixed apical defect, likely breast attenuation, no ischemia; d. 09/2014 MV: No ischemia; e. 03/2018 MV [facility]): no ischemia.   GERD (gastroesophageal reflux disease)    HLD (hyperlipidemia)    Hypertension    Hypothyroidism    Permanent atrial fibrillation (HCC)    a. Dx ~ 2017. CHA2DS2VASc = 6-->coumadin .    Medications:  Scheduled:   atorvastatin   40 mg  Oral QHS   Chlorhexidine  Gluconate Cloth  6 each Topical Daily   digoxin   0.125 mg Intravenous Daily   feeding supplement (GLUCERNA SHAKE)  237 mL Oral TID BM   fluticasone   1 spray Each Nare Daily   furosemide   40 mg Intravenous BID   insulin  aspart  0-9 Units Subcutaneous TID WC   levothyroxine   88 mcg Oral Q0600   loratadine   5 mg Oral Daily   melatonin  10 mg Oral QHS   metoprolol  tartrate  50 mg Oral BID   pantoprazole   40 mg Oral Daily   sodium chloride  flush  3 mL Intravenous Q12H   Infusions:   amiodarone  30 mg/hr (05/04/24 0009)   heparin  800 Units/hr (05/03/24 2000)   PRN:   Assessment: 89 year old female presenting with ACS/possible STEMI with troponin of 18.600 ng/L. Notable PMH permanent Afib with warfarin use, HFpEF, HTN, PVD, and hypothyroidism. CBC notable for hemoglobin of 14.1 g/dL and platelet count of 764 K/uL. Serum creatinine of 0.98 mg/dL (CrCl 27 mL/min) at baseline.   Notable fill history for warfarin 2 mg daily with current INR of 1.7. Will use actual body weight of 50 kg for heparin  dosing since patient is non-obese.   8/11 1954 HL 0.43 8/12 0339 HL 0.47, therapeutic X 2  8/13 0455 HL 0. 11, SUBtherapeutic  8/13 2235 HL 0.30, therapeutic (barely)   Goal of Therapy:  Heparin  level 0.3-0.7 units/ml Monitor platelets by anticoagulation protocol: Yes   Plan:  8/13:  HL @ 2235 = 0.30,  therapeutic - HL is on lowest end of therapeutic and trending down so will increase drip rate to 900 units/hr - recheck HL 8 hrs after rate change  - CBC daily   Daniela Siebers D, PharmD Clinical Pharmacist 05/04/2024 1:01 AM

## 2024-05-04 NOTE — TOC Progression Note (Signed)
 Transition of Care [facility]) - Progression Note    Patient Details  Name: Karen Cunningham MRN: [id number] Date of Birth: [birth year]  [facility] CM/SW Contact  K'La JINNY Ruts, LCSW Phone Number: 05/04/2024, 12:12 PM  Clinical Narrative:    Chart reviewed. I spoke with the patient son. The patient report that the family has not made a decision on the patient next level of care. The patient son reports that he will consult with his other family members and will provide a final answer later today. TOC will follow up at a later time.      Barriers to Discharge: Continued Medical Work up               Expected Discharge Plan and Services       Living arrangements for the past 2 months: Single Family Home                                       Social Drivers of Health (SDOH) Interventions SDOH Screenings   Food Insecurity: No Food Insecurity (05/01/2024)  Housing: Low Risk  (05/01/2024)  Transportation Needs: No Transportation Needs (05/01/2024)  Utilities: Not At Risk (05/01/2024)  Social Connections: Socially Isolated (05/01/2024)  Tobacco Use: Low Risk  (05/01/2024)    Readmission Risk Interventions     No data to display

## 2024-05-04 NOTE — Progress Notes (Signed)
 Physical Therapy Treatment Patient Details Name: SUNDEE GARLAND MRN: [id number] DOB: [birth year] Today's Date: 05/04/2024   History of Present Illness 89 y/o female presented to ED on 05/01/24 for respiratory distress and chest pain. Admitted for NSTEMI and acute on chronic HFpEF decompensation. PMH: Afib, HFpEF, CKD stage IIIb, CAD s/p stent    PT Comments  Patient in recliner on arrival and agreeable to PT/OT session focused on activity progression and tolerance. Requires CGA-minA to stand from recliner with RW. Ambulated ~100' with RW and CGA-minA + chair follow for safety. On 2L O2 throughout, spO2>90% when able to obtain accurate pleth. Patient would require 24 hour supervision at discharge for safety/assistance for mobility from family. Discharge plan remains appropriate at this time until family establishes plan for care at home.     If plan is discharge home, recommend the following: A little help with walking and/or transfers;A little help with bathing/dressing/bathroom;Assistance with cooking/housework;Assist for transportation;Help with stairs or ramp for entrance;Direct supervision/assist for medications management;Direct supervision/assist for financial management   Can travel by private vehicle     Yes  Equipment Recommendations  Rolling Genene Kilman (2 wheels);BSC/3in1;Wheelchair (measurements PT);Wheelchair cushion (measurements PT)    Recommendations for Other Services       Precautions / Restrictions Precautions Precautions: Fall Recall of Precautions/Restrictions: Impaired Restrictions Weight Bearing Restrictions Per Provider Order: No     Mobility  Bed Mobility               General bed mobility comments: NT in recliner pre/post session    Transfers Overall transfer level: Needs assistance Equipment used: Rolling Hattie Pine (2 wheels) Transfers: Sit to/from Stand Sit to Stand: Contact guard assist, Min assist                 Ambulation/Gait Ambulation/Gait assistance: Contact guard assist Gait Distance (Feet): 100 Feet Assistive device: Rolling Wilberta Dorvil (2 wheels) Gait Pattern/deviations: Step-through pattern, Decreased stride length Gait velocity: decreased     General Gait Details: very short step through pattern. Chair follow for safety. On 2L, VSS when able to obtain good pleth   Stairs             Wheelchair Mobility     Tilt Bed    Modified Rankin (Stroke Patients Only)       Balance Overall balance assessment: Needs assistance Sitting-balance support: Feet supported Sitting balance-Leahy Scale: Fair     Standing balance support: Bilateral upper extremity supported, During functional activity, Reliant on assistive device for balance Standing balance-Leahy Scale: Fair                              Hotel manager: Impaired Factors Affecting Communication: Hearing impaired  Cognition Arousal: Alert Behavior During Therapy: WFL for tasks assessed/performed   PT - Cognitive impairments: No apparent impairments                         Following commands: Impaired Following commands impaired: Follows one step commands with increased time    Cueing Cueing Techniques: Verbal cues, Gestural cues, Visual cues, Tactile cues  Exercises      General Comments General comments (skin integrity, edema, etc.): HR to max 127 bpm; spo2 appears >90% on 2 L throughout when pleth is accurate, no SOB noted      Pertinent Vitals/Pain Pain Assessment Pain Assessment: No/denies pain    Home Living  Prior Function            PT Goals (current goals can now be found in the care plan section) Acute Rehab PT Goals PT Goal Formulation: With patient/family Time For Goal Achievement: 05/17/24 Potential to Achieve Goals: Fair Progress towards PT goals: Progressing toward goals    Frequency    Min  3X/week      PT Plan      Co-evaluation PT/OT/SLP Co-Evaluation/Treatment: Yes Reason for Co-Treatment: For patient/therapist safety;Necessary to address cognition/behavior during functional activity;Complexity of the patient's impairments (multi-system involvement);To address functional/ADL transfers PT goals addressed during session: Mobility/safety with mobility;Balance;Proper use of DME OT goals addressed during session: ADL's and self-care      AM-PAC PT 6 Clicks Mobility   Outcome Measure  Help needed turning from your back to your side while in a flat bed without using bedrails?: A Little Help needed moving from lying on your back to sitting on the side of a flat bed without using bedrails?: A Little Help needed moving to and from a bed to a chair (including a wheelchair)?: A Little Help needed standing up from a chair using your arms (e.g., wheelchair or bedside chair)?: A Little Help needed to walk in hospital room?: A Little Help needed climbing 3-5 steps with a railing? : Total 6 Click Score: 16    End of Session Equipment Utilized During Treatment: Oxygen Activity Tolerance: Patient tolerated treatment well Patient left: in chair;with call bell/phone within reach;with family/visitor present Nurse Communication: Mobility status PT Visit Diagnosis: Unsteadiness on feet (R26.81);Muscle weakness (generalized) (M62.81)     Time: 8947-8877 PT Time Calculation (min) (ACUTE ONLY): 30 min  Charges:    $Therapeutic Activity: 8-22 mins PT General Charges $$ ACUTE PT VISIT: 1 Visit                     Maryanne Finder, PT, DPT Physical Therapist - [facility]  [facility]    Ahmed Inniss A Verlie Hellenbrand 05/04/2024, 1:14 PM

## 2024-05-05 DIAGNOSIS — I509 Heart failure, unspecified: Secondary | ICD-10-CM | POA: Diagnosis not present

## 2024-05-05 DIAGNOSIS — I5022 Chronic systolic (congestive) heart failure: Secondary | ICD-10-CM

## 2024-05-05 DIAGNOSIS — Z515 Encounter for palliative care: Secondary | ICD-10-CM | POA: Diagnosis not present

## 2024-05-05 DIAGNOSIS — I214 Non-ST elevation (NSTEMI) myocardial infarction: Secondary | ICD-10-CM | POA: Diagnosis not present

## 2024-05-05 DIAGNOSIS — Z7189 Other specified counseling: Secondary | ICD-10-CM | POA: Diagnosis not present

## 2024-05-05 DIAGNOSIS — I4891 Unspecified atrial fibrillation: Secondary | ICD-10-CM | POA: Diagnosis not present

## 2024-05-05 DIAGNOSIS — I482 Chronic atrial fibrillation, unspecified: Secondary | ICD-10-CM | POA: Diagnosis not present

## 2024-05-05 DIAGNOSIS — I5023 Acute on chronic systolic (congestive) heart failure: Secondary | ICD-10-CM | POA: Diagnosis not present

## 2024-05-05 DIAGNOSIS — J9601 Acute respiratory failure with hypoxia: Secondary | ICD-10-CM | POA: Diagnosis not present

## 2024-05-05 LAB — BASIC METABOLIC PANEL WITH GFR
Anion gap: 10 (ref 5–15)
BUN: 39 mg/dL — ABNORMAL HIGH (ref 8–23)
CO2: 29 mmol/L (ref 22–32)
Calcium: 9.1 mg/dL (ref 8.9–10.3)
Chloride: 104 mmol/L (ref 98–111)
Creatinine, Ser: 0.93 mg/dL (ref 0.44–1.00)
GFR, Estimated: 58 mL/min — ABNORMAL LOW (ref >60)
Glucose, Bld: 121 mg/dL — ABNORMAL HIGH (ref 70–99)
Potassium: 4.2 mmol/L (ref 3.5–5.1)
Sodium: 143 mmol/L (ref 135–145)

## 2024-05-05 LAB — CBC
HCT: 44.3 % (ref 36.0–46.0)
Hemoglobin: 13.9 g/dL (ref 12.0–15.0)
MCH: 28.3 pg (ref 26.0–34.0)
MCHC: 31.4 g/dL (ref 30.0–36.0)
MCV: 90.2 fL (ref 80.0–100.0)
Platelets: 181 K/uL (ref 150–400)
RBC: 4.91 MIL/uL (ref 3.87–5.11)
RDW: 15.3 % (ref 11.5–15.5)
WBC: 9.3 K/uL (ref 4.0–10.5)
nRBC: 0 % (ref 0.0–0.2)

## 2024-05-05 LAB — DIGOXIN LEVEL: Digoxin Level: 0.7 ng/mL — ABNORMAL LOW (ref 0.8–2.0)

## 2024-05-05 LAB — GLUCOSE, CAPILLARY
Glucose-Capillary: 121 mg/dL — ABNORMAL HIGH (ref 70–99)
Glucose-Capillary: 109 mg/dL — ABNORMAL HIGH (ref 70–99)
Glucose-Capillary: 133 mg/dL — ABNORMAL HIGH (ref 70–99)
Glucose-Capillary: 188 mg/dL — ABNORMAL HIGH (ref 70–99)

## 2024-05-05 LAB — PROTIME-INR
INR: 1.3 — ABNORMAL HIGH (ref 0.8–1.2)
Prothrombin Time: 17.2 s — ABNORMAL HIGH (ref 11.4–15.2)

## 2024-05-05 MED ORDER — WARFARIN SODIUM 3 MG PO TABS
3.0000 mg | ORAL_TABLET | Freq: Once | ORAL | Status: AC
  Administered 2024-05-05: 3 mg via ORAL
  Filled 2024-05-05: qty 1

## 2024-05-05 NOTE — Progress Notes (Signed)
 Palliative:  HPI: 89 y.o. female with past medical history of HFpEF, chronic Afib on warfarin, CKD stage 3b, CAD s/p stent, hypothyroidism admitted on 05/01/2024 with chest pain and shortness of breath with NSTEMI, declined EF, unstable Afib, respiratory distress.   I met today with Ms. Costanza daughter-in-law, Holley, and CSW. We spent time reviewing current illness. We spent time discussing plan for returning home. They do not want to pursue SNF placement. They wish to return home. We spent time reviewing palliative vs hospice support at home. At this time they are prepared to have help from hospice at home. They are preparing themselves to return home and help her spend her time at home until the end. We reviewed some of Pam's concerns such as high blood sugar and we reviewed that this should not be a concern. We spent time discussing the importance of focusing on Ms. Kosek's comfort and happiness instead of numbers. Pam confirms that she is trying to refocus herself on not trying to fix but to help Ms. Rozier to enjoy whatever time is left. She agrees with hospice support at home - she will speak with the rest of the family to determine which hospice they wish to work with.   Update: I returned to bedside and met with Ms. Emilio Holley, and son. Pam shares that they have discussed hospice and all are on board. They would like to work with [facility]. They wish to optimize with medications and go home with hospice. They wish to remain at home for the rest of her days. She does not desire return to the hospital. She wishes to die at home with her dogs at her side. She is at peace and is ready to go when it is her time. Ms. Chihuahua is looking forward to return home.   All questions/concerns addressed. Therapeutic listening provided. Emotional support provided.   Exam: Alert, mostly oriented. No distress. HR tachy at times, irreg, irreg 100s. Abd soft. Moves all extremities.   Plan: - DNR/DNI - Home with  hospice  120 min  Bernarda Kitty, NP Palliative Medicine Team Pager [phone] (Please see [email or web address] for schedule) Team Phone [phone]

## 2024-05-05 NOTE — Plan of Care (Signed)
  Problem: Coping: Goal: Ability to adjust to condition or change in health will improve Outcome: Progressing   Problem: Fluid Volume: Goal: Ability to maintain a balanced intake and output will improve Outcome: Progressing   Problem: Health Behavior/Discharge Planning: Goal: Ability to identify and utilize available resources and services will improve Outcome: Progressing Goal: Ability to manage health-related needs will improve Outcome: Progressing   Problem: Metabolic: Goal: Ability to maintain appropriate glucose levels will improve Outcome: Progressing   Problem: Nutritional: Goal: Maintenance of adequate nutrition will improve Outcome: Progressing   Problem: Skin Integrity: Goal: Risk for impaired skin integrity will decrease Outcome: Progressing   Problem: Tissue Perfusion: Goal: Adequacy of tissue perfusion will improve Outcome: Progressing   

## 2024-05-05 NOTE — Consult Note (Signed)
 PHARMACY - ANTICOAGULATION CONSULT NOTE  Pharmacy Consult for warfarin re-initiation Indication: atrial fibrillation  Allergies  Allergen Reactions   Codeine Nausea And Vomiting   Nsaids     Dr instructed to avoid due to kidney issues    Ibuprofen Other (See Comments)    Dr instructed to avoid due to kidney issues    Prednisone Other (See Comments)    ELEVATED BLOOD PRESSURE.    Patient Measurements: Height: 5' (152.4 cm) Weight: 60.6 kg (133 lb 9.6 oz) IBW/kg (Calculated) : 45.5 HEPARIN  DW (KG): 50.1  Vital Signs: Temp: 97.7 F (36.5 C) (08/15 0330) Temp Source: Oral (08/15 0330) BP: 113/55 (08/15 0800) Pulse Rate: 100 (08/15 0800)  Labs: Recent Labs    05/03/24 0455 05/03/24 1512 05/03/24 2235 05/04/24 0522 05/04/24 0922 05/05/24 0439  HGB 13.7  --   --  14.0  --  13.9  HCT 44.7  --   --  43.5  --  44.3  PLT 203  --   --  179  --  181  LABPROT  --   --   --   --  17.2* 17.2*  INR  --   --   --   --  1.3* 1.3*  HEPARINUNFRC 0.11* 0.43 0.30  --  0.42  --   CREATININE 1.01*  --   --  0.90  --  0.93    Estimated Creatinine Clearance: 32 mL/min (by C-G formula based on SCr of 0.93 mg/dL).   Medical History: Past Medical History:  Diagnosis Date   (HFpEF) heart failure with preserved ejection fraction (HCC)    a. 07/2016 Echo: EF 51%; b. 12/2016 Echo: EF 41%. Mod TR, mild PR; c. 09/2017 Echo [facility]): EF 62%, Sev BAE. Mod TR, mild PR; d. 03/2019 Echo([facility]): EF 57%, Gr3 DD. Sev dil LA, mild dil RA. Midly dil Asc Ao. Trace AI. Mild to mod TR, mild PR. RVSP .   Anemia    Arthritis    Carotid arterial disease (HCC)    a. 10/2013 Mod to Sev RICA dzs. Mild dzs in L bulb; b. 10/2013 CTA Carotids: LICA 50p, RICA w/o stenosis.   CKD (chronic kidney disease), stage III (HCC)    Coronary artery disease    a. Several neg MV between 2008 and 2010; b. 2010 s/p PCI/DES to the mLAD; c. 10/2013 MV: Fixed apical defect, likely breast attenuation, no ischemia; d. 09/2014 MV: No  ischemia; e. 03/2018 MV [facility]): no ischemia.   GERD (gastroesophageal reflux disease)    HLD (hyperlipidemia)    Hypertension    Hypothyroidism    Permanent atrial fibrillation (HCC)    a. Dx ~ 2017. CHA2DS2VASc = 6-->coumadin .    Medications:  Scheduled:   amiodarone   400 mg Oral BID   Followed by   NOREEN ON 05/11/2024] amiodarone   200 mg Oral BID   Followed by   NOREEN ON 05/18/2024] amiodarone   200 mg Oral Daily   atorvastatin   40 mg Oral QHS   Chlorhexidine  Gluconate Cloth  6 each Topical Daily   digoxin   0.125 mg Oral Daily   enoxaparin  (LOVENOX ) injection  60 mg Subcutaneous Q12H   feeding supplement (GLUCERNA SHAKE)  237 mL Oral TID BM   fluticasone   1 spray Each Nare Daily   furosemide   40 mg Oral Daily   insulin  aspart  0-9 Units Subcutaneous TID WC   levothyroxine   88 mcg Oral Q0600   loratadine   5 mg Oral Daily   melatonin  10 mg Oral QHS   metoprolol  tartrate  50 mg Oral BID   pantoprazole   40 mg Oral Daily   sodium chloride  flush  3 mL Intravenous Q12H   warfarin  3 mg Oral ONCE-1600   Warfarin - Pharmacist Dosing Inpatient   Does not apply q1600   Infusions:    PRN: acetaminophen , ALPRAZolam , ondansetron  (ZOFRAN ) IV, senna, sodium chloride  flush  Assessment: 89 year old female who presented with NSTEMI and has chronic Afib in RVR. CHADVASC score of 6. Notable PMH hypothyroidism, CKD stage III, HFrEF, HTN. Currently receiving heparin  infusion for anticoagulation. Renal function at baseline with Scr of 0.9 mg/dL (CrCl 33 mL/min, GFR > 60 mL/min). CBC stable with hemoglobin of 14 g/dL and platelet count of 820 K/uL.   Patient takes warfarin 2 mg daily at home. Upon presentation initial INR was subtherapeutic at 1.7. Of note, amiodarone  was started with this admission which will increase INR. Other notable warfarin interactions with atorvastatin  (increased INR), and levothyroxine  (increased INR). Indicated for enoxaparin  bridge due to high stroke risk per CHADVASc  score.   Goal of Therapy:  INR 2-3 Monitor platelets by anticoagulation protocol: Yes  Date:  INR: Dose: 8/14 1.2  3mg  8/15 1.3 3mg (ordered)  Plan:  - Give warfarin 3 mg PO x 1. Will likely need lower warfarin dose tomorrow given new amiodarone  interaction.  - Daily INR checks with AM labs.  - Continue enoxaparin  60 mg subcut BID until INR > 2 or 5 days of warfarin therapy completed  - Monitor CBC daily for hemoglobin and platelet count   Lonna Rabold A Clovis Warwick, PharmD Clinical Pharmacist 05/05/2024 8:21 AM

## 2024-05-05 NOTE — TOC Progression Note (Signed)
 Transition of Care [facility]) - Progression Note    Patient Details  Name: Karen Cunningham MRN: [id number] Date of Birth: 1930-11-15  [facility] CM/SW Contact  K'La JINNY Ruts, LCSW Phone Number: 05/05/2024, 12:03 PM  Clinical Narrative:    Chart reviewed. I spoke with the patient daughter in law. The patient daughter in law informed me that the patient will be discharging home. The patient daughter in law reports that she has put together family and friends that has committed to help with giving the patient 24/7 care in the home. The patient daughter in law reports that she will follow up with me about if the patient will need oxygen in the home.      Barriers to Discharge: Continued Medical Work up               Expected Discharge Plan and Services       Living arrangements for the past 2 months: Single Family Home                                       Social Drivers of Health (SDOH) Interventions SDOH Screenings   Food Insecurity: No Food Insecurity (05/01/2024)  Housing: Low Risk  (05/01/2024)  Transportation Needs: No Transportation Needs (05/01/2024)  Utilities: Not At Risk (05/01/2024)  Social Connections: Socially Isolated (05/01/2024)  Tobacco Use: Low Risk  (05/01/2024)    Readmission Risk Interventions     No data to display

## 2024-05-05 NOTE — Progress Notes (Signed)
 Progress Note   [name] FMW:[id number] DOB: 1931-07-15 DOA: 05/01/2024     4 DOS: the patient was seen and examined on 05/05/2024   Brief hospital course: Taken from prior notes.  Karen Cunningham is a 89 y.o. female with medical history significant of chronic A-fib on Coumadin , HFpEF, CKD stage III b, CAD status post stenting in 2008 in 2010, hypothyroidism, Presented with new onset chest pain and shortness of breath and palpitations.  On presentation patient was found to be in A-fib with RVR, heart rate up to 170, hypoxic and she was placed on BiPAP for increased work of breathing.  Chest x-ray with pulmonary edema, mild leukocytosis and transaminitis.  T. bili above 18,000.  Cardiology was consulted and patient was started on heparin  infusion, received IV Lasix  and metoprolol  pushes.  As heart rate remained uncontrolled patient was started on amiodarone  infusion and received 2 doses of digoxin . Cardiology was suggesting cardiac cath after improving respiratory status but family would like to do noninvasive management only  8/12: Patient was able to wean to 10 L of oxygen from BiPAP but remained tachycardic and tachypneic, heart rate remained uncontrolled and mostly in 150s, labs with slight worsening of leukocytosis at 12.6, troponin peaked at 18,600. Echocardiogram with new diagnosis of HFrEF, EF of 25 to 30%, did show regional wall motion abnormalities.  Also shows moderately reduced RV function and normal pulmonary arterial pressure, moderate to severely dilated both atrium, moderate pleural effusion on left, mild to moderate MR, moderate to severe AS.   Palliative care was also consulted to discuss goals of care, patient with poor prognosis.  CODE STATUS changed to DNR and to continue current level of care.  8/13: Currently stable but continued to need 10 L of oxygen, heart rate now in the low 100s.  Worsening leukocytosis likely reactive secondary to NSTEMI, slight  increase in BUN to 39 and creatinine to 1.01.  Continuing current management.  Digoxin  was added by cardiology.  Family would like to continue current level of care without any escalation at this time and they will decide in next couple of days depending on which direction she will go.  8/14: Hemodynamic stable and improving oxygen requirement, currently on 2 L of oxygen.  PT is recommending SNF-patient wants to go home, family has not final decision regarding disposition.  If remains stable likely can be discharged tomorrow. Heparin  infusion was discontinued and patient is now back on warfarin, amiodarone  infusion has been switched with p.o.  8/15: Clinically stable, heart rate variable and mostly remained in the low 100s, family is not leaning more towards home with hospice.  Assessment and Plan: * NSTEMI (non-ST elevated myocardial infarction) (HCC) Chest pain improved, troponin peaked at 18,600. Echocardiogram with regional wall motion abnormalities-consistent with NSTEMI. Family does not want any invasive procedure. - Cardiology is on board -Completed 48 hours of heparin  infusion and it has been now discontinued -Continue with medical management  Acute on chronic HFrEF (heart failure with reduced ejection fraction) (HCC) Likely secondary to NSTEMI, echocardiogram with EF of 25 to 30% with regional wall motion abnormalities, biventricular failure and biatrial dilatation with moderate MR, TR and moderate to severe AS. EF was normal a month ago and it was mild MR, TR and AS. BNP elevated and chest x-ray with concern of pulmonary vascular congestion. - IV Lasix  switched with p.o. -Daily weight and BMP -Strict intake and output -Daily weight and BMP -Strict intake and output  Chronic atrial fibrillation  with RVR (HCC) Heart rate slowly improving mostly in high 90s -Continue with digoxin  -Amiodarone  infusion has been switched with p.o. amiodarone  400 mg twice daily -Continue with  metoprolol  -Patient was restarted on home Coumadin  with Lovenox  bridge -Continue to monitor   Acute respiratory failure with hypoxia and hypercapnia (HCC) Initially requiring BiPAP, VBG with concern of hypoxia and hypercapnia.  Repeat VBG with improvement in hypercapnia and pH.  No baseline oxygen use,  Now improved and patient is on room air -Continue supplemental oxygen-wean as tolerated  Transaminitis Likely secondary to hepatic congestion with acute heart failure. - Continue to monitor  CKD (chronic kidney disease), stage III (HCC) Renal function currently around baseline.  History of CKD stage III A.  - Monitor renal function while patient is being diuresed -Avoid nephrotoxins  Hypertension Continue with metoprolol  and Lasix   Hypothyroidism TSH normal. - Continue with home Synthroid   Hyperglycemia No prior diagnosis of diabetes, A1c of 6.2. - Continue to monitor  Dyslipidemia - Continue with statin  GERD (gastroesophageal reflux disease) - Continue with PPI   Subjective: Patient was sitting comfortably when seen today.  Denies any chest pain.  Remained mildly tachycardic.  Physical Exam: Vitals:   05/05/24 1230 05/05/24 1300 05/05/24 1431 05/05/24 1500  BP:  106/80 (!) 118/98 (!) 116/56  Pulse: (!) 148 (!) 109 86 97  Resp: (!) 24 14 (!) 38 (!) 22  Temp:      TempSrc:      SpO2: 98% 97% 95% 93%  Weight:      Height:       General.  Frail elderly lady, in no acute distress. Pulmonary.  Lungs clear bilaterally, normal respiratory effort. CV.  Irregularly irregular Abdomen.  Soft, nontender, nondistended, BS positive. CNS.  Alert and oriented .  No focal neurologic deficit. Extremities.  No edema,  pulses intact and symmetrical. Psychiatry.  Judgment and insight appears normal.   Data Reviewed: Prior data reviewed  Family Communication: Discussed with 2 DIL at bedside  Disposition: Status is: Inpatient Remains inpatient appropriate because: Severity  of illness  Planned Discharge Destination: To be determined  DVT prophylaxis.  Heparin  infusion Time spent: 50 minutes  This record has been created using Conservation officer, historic buildings. Errors have been sought and corrected,but may not always be located. Such creation errors do not reflect on the standard of care.   Author: Amaryllis Dare, MD 05/05/2024 3:58 PM  For on call review [email or web address].

## 2024-05-05 NOTE — Progress Notes (Signed)
 Rounding Note   Patient Name: Karen Cunningham Date of Encounter: 05/05/2024  [facility] Cardiologist: [name], MD   Subjective Resting in recliner. No chest pain, dyspnea, or palpitations. Was given IV diltiazem  10 mg at 1900 given Afib with RVR into the 140s bpm. With this, ventricular rates have improved to the 90s to low 100s bpm. BP stable. Has started Lovenox  to warfarin bridge with INR 1.3.   Scheduled Meds:  amiodarone   400 mg Oral BID   Followed by   NOREEN ON 05/11/2024] amiodarone   200 mg Oral BID   Followed by   NOREEN ON 05/18/2024] amiodarone   200 mg Oral Daily   atorvastatin   40 mg Oral QHS   Chlorhexidine  Gluconate Cloth  6 each Topical Daily   digoxin   0.125 mg Oral Daily   enoxaparin  (LOVENOX ) injection  60 mg Subcutaneous Q12H   feeding supplement (GLUCERNA SHAKE)  237 mL Oral TID BM   fluticasone   1 spray Each Nare Daily   furosemide   40 mg Oral Daily   insulin  aspart  0-9 Units Subcutaneous TID WC   levothyroxine   88 mcg Oral Q0600   loratadine   5 mg Oral Daily   melatonin  10 mg Oral QHS   metoprolol  tartrate  50 mg Oral BID   pantoprazole   40 mg Oral Daily   sodium chloride  flush  3 mL Intravenous Q12H   warfarin  3 mg Oral ONCE-1600   Warfarin - Pharmacist Dosing Inpatient   Does not apply q1600   Continuous Infusions:   PRN Meds: acetaminophen , ALPRAZolam , ondansetron  (ZOFRAN ) IV, senna, sodium chloride  flush   Vital Signs  Vitals:   05/05/24 1130 05/05/24 1200 05/05/24 1230 05/05/24 1300  BP: (!) 97/54 (!) 116/51  106/80  Pulse: 63 94 (!) 148 (!) 109  Resp: (!) 32 (!) 23 (!) 24 14  Temp:  97.7 F (36.5 C)    TempSrc:  Axillary    SpO2: 95% 94% 98% 97%  Weight:      Height:        Intake/Output Summary (Last 24 hours) at 05/05/2024 1429 Last data filed at 05/05/2024 1402 Gross per 24 hour  Intake 82.14 ml  Output 450 ml  Net -367.86 ml      05/05/2024    5:00 AM 05/04/2024    5:00 AM 05/03/2024    5:00 AM  Last 3  Weights  Weight (lbs) 133 lb 9.6 oz 132 lb 4.4 oz 128 lb 8.5 oz  Weight (kg) 60.6 kg 60 kg 58.3 kg      Telemetry Atrial fibrillation rate 80s -low 100s bpm - Personally Reviewed  ECG  No new tracings - Personally Reviewed  Physical Exam  Constitutional: Frail, oriented to person, place, and time. No distress.  Head: Grossly normal Eyes:  no discharge. No scleral icterus.  Neck: No JVD, no carotid bruits  Cardiovascular: Irregularly irregular, no murmurs appreciated Pulmonary/Chest: Clear to auscultation bilaterally, no wheezes or rails Abdominal: Soft.  no distension.  no tenderness.  Musculoskeletal: Normal range of motion Neurological:  normal muscle tone. Coordination normal. No atrophy Skin: Skin warm and dry Psychiatric: normal affect, pleasant   Labs High Sensitivity Troponin:   Recent Labs  Lab 05/01/24 0728 05/01/24 0903 05/02/24 0621  TROPONINIHS 18,600* 14,167* 11,310*     Chemistry Recent Labs  Lab 05/01/24 0728 05/01/24 0903 05/02/24 0339 05/03/24 0455 05/04/24 0522 05/05/24 0439  NA 144  --  142 139 142 143  K 3.6  --  4.4 4.2 3.4* 4.2  CL 109  --  106 100 101 104  CO2 24  --  22 26 29 29   GLUCOSE 263*  --  141* 127* 119* 121*  BUN 19  --  28* 39* 43* 39*  CREATININE 0.98  --  0.93 1.01* 0.90 0.93  CALCIUM  9.1  --  9.4 9.0 8.6* 9.1  MG  --  2.0  --   --   --   --   PROT 8.0  --  7.1  --   --   --   ALBUMIN 4.1  --  3.5  --   --   --   AST 105*  --  97*  --   --   --   ALT 47*  --  41  --   --   --   ALKPHOS 131*  --  96  --   --   --   BILITOT 0.9  --  1.2  --   --   --   GFRNONAA 54*  --  58* 53* >60 58*  ANIONGAP 11  --  14 13 12 10     Lipids No results for input(s): CHOL, TRIG, HDL, LABVLDL, LDLCALC, CHOLHDL in the last 168 hours.  Hematology Recent Labs  Lab 05/03/24 0455 05/04/24 0522 05/05/24 0439  WBC 15.3* 9.9 9.3  RBC 4.83 4.88 4.91  HGB 13.7 14.0 13.9  HCT 44.7 43.5 44.3  MCV 92.5 89.1 90.2  MCH 28.4 28.7  28.3  MCHC 30.6 32.2 31.4  RDW 15.4 15.2 15.3  PLT 203 179 181   Thyroid   Recent Labs  Lab 05/01/24 0903  TSH 3.030    BNP Recent Labs  Lab 05/01/24 0728  BNP 955.8*    DDimer No results for input(s): DDIMER in the last 168 hours.   Radiology  No results found.   Cardiac Studies  05/01/2024 Echo complete 1. Left ventricular ejection fraction, by estimation, is 25 to 30%. The  left ventricle has severely decreased function. The left ventricle  demonstrates regional wall motion abnormalities (see scoring  diagram/findings for description). Left ventricular  diastolic function could not be evaluated.   2. Right ventricular systolic function is moderately reduced. The right  ventricular size is normal. There is normal pulmonary artery systolic  pressure.   3. Left atrial size was moderately dilated.   4. Right atrial size was severely dilated.   5. Moderate pleural effusion in the left lateral region.   6. The mitral valve is degenerative. Mild to moderate mitral valve  regurgitation. Severe mitral annular calcification.   7. Tricuspid valve regurgitation is mild to moderate.   8. The aortic valve has an indeterminant number of cusps. There is mild  calcification of the aortic valve. There is moderate thickening of the  aortic valve. Aortic valve regurgitation is not visualized. Moderate to  severe low-flow/low-gradient aortic  valve stenosis. Aortic valve area, by VTI measures 0.55 cm. Aortic valve  mean gradient measures 11.0 mmHg. DI 0.24.   9. The inferior vena cava is normal in size with <50% respiratory  variability, suggesting right atrial pressure of 8 mmHg.    Patient Profile   89 y.o. female  with a hx of CAD s/p PCI to the mid LAD in 2010, chronic HFpEF, permanent atrial fibrillation on Coumadin , carotid artery disease, mild aortic valve stenosis, hypertension, hyperlipidemia, CKD, hypothyroidism, and chronic lower extremity edema who was admitted 8/11 with  dyspnea, who we are seeing  for the ongoing management of atrial fibrillation with RVR, NSTEMI, and newly reduced LV systolic function.    Assessment & Plan    Permanent atrial fibrillation with RVR - Difficult to control rate in the setting of non-STEMI, cardiomyopathy - Options limited by relative hypotension - Remains on Lopressor  50 mg bid, digoxin  0.125 mg daily (digoxin  level 0.7 on 05/05/2024) and was transitioned from IV amiodarone  to oral amiodarone  400 mg bid for assistance with rate control on 8/14 - If rate runs high may need to increase metoprolol  - For the most part she is relatively asymptomatic, sedentary at baseline, predominantly homebound - Has been transitioned from heparin  gtt to Lovenox  to warfarin bridge - Could not afford her deductible for DOAC in the past with current deductible around $400  NSTEMI - Mild chest discomfort on 8/9 which resolved without intervention - Troponin 81399 > 1416 > 11310 - EKG without acute ischemic changes - Echo shows severely reduced LV function with EF 25-30% and regional wall motion abnormalities, - After discussion with interventional cardiology, plan for conservative/medical management secondary to frail nature, age - Has completed heparin  gtt, now transitioning to warfarin in lieu of ASA   Heart failure with newly reduced EF - Prior echo with EF 55-60% - Echo this admission shows EF 25-30% - BNP 955 - CXR with findings concerning for pulmonary edema and small pleural effusions - Has been transitioned from IV Lasix  to oral Lasix  40 mg daily - GDMT on hold secondary to hypotension and need to uptitrate metoprolol  as blood pressure will permit - Given cardiomyopathy, would transition Lopressor  to Toprol  XL or Coreg, though for now will continue Lopressor  as this is needed to assist with ventricular rate control of her Afib   Acute hypoxic respiratory failure - Likely due to volume overload in the setting of cardiomyopathy, atrial  fibrillation with RVR  - Improved - Has been transitioned from IV Lasix  to oral Lasix    For questions or updates, please contact [facility] Please consult [email or web address] for contact info under     Signed, Bernardino Bring, PA-C  05/05/2024, 2:29 PM

## 2024-05-05 NOTE — Progress Notes (Signed)
 Physical Therapy Treatment Patient Details Name: Karen Cunningham MRN: [id number] DOB: 01-21-1931 Today's Date: 05/05/2024   History of Present Illness 89 y/o female presented to ED on 05/01/24 for respiratory distress and chest pain. Admitted for NSTEMI and acute on chronic HFpEF decompensation. PMH: Afib, HFpEF, CKD stage IIIb, CAD s/p stent    PT Comments  Pt was sitting in recliner upon arrival. Supportive family x 3 members present. Pt is A an O x 3. Agrees to session and remains cooperative throughout. Pt requested to use BR to urinate. Does have incontinence issues that are not at baseline. Encouraged family to use brief at DC if needed and they are considering home pur wick system. Pt was able to stand and take a few steps to [facility] to urinate prior to standing and ambulating ~ 100 ft with RW. HR in Afib but  is at baseline per family.  Pt remained on rm air throughout session. No desaturation. DC recs updated to home with HHPT. She will have 24/7 assistance at DC.    If plan is discharge home, recommend the following: A little help with walking and/or transfers;A little help with bathing/dressing/bathroom;Assistance with cooking/housework;Assist for transportation;Help with stairs or ramp for entrance;Direct supervision/assist for medications management;Direct supervision/assist for financial management     Equipment Recommendations  Rolling walker (2 wheels);BSC/3in1;Wheelchair (measurements PT);Wheelchair cushion (measurements PT)       Precautions / Restrictions Precautions Precautions: Fall Recall of Precautions/Restrictions: Impaired Restrictions Weight Bearing Restrictions Per Provider Order: Yes     Mobility  Bed Mobility  General bed mobility comments: Pt was in recliner pre/post session    Transfers Overall transfer level: Needs assistance Equipment used: Rolling walker (2 wheels) Transfers: Sit to/from Stand Sit to Stand: Contact guard assist, Min assist  General  transfer comment: CGA + vcs for handplacement and technique improvements    Ambulation/Gait Ambulation/Gait assistance: Contact guard assist Gait Distance (Feet): 100 Feet Assistive device: Rolling walker (2 wheels) Gait Pattern/deviations: Step-through pattern Gait velocity: decreased  General Gait Details: Pt was able to ambulate 100 ft with RW CGA. no LOB. pt self limits distance. NO LOB or vital concerns. pt was on rm air throughout session    Balance Overall balance assessment: Needs assistance Sitting-balance support: Feet supported Sitting balance-Leahy Scale: Fair     Standing balance support: Bilateral upper extremity supported, During functional activity, Reliant on assistive device for balance Standing balance-Leahy Scale: Fair Standing balance comment: reliant on RW during dynamic standing activity       Communication Communication Communication: Impaired Factors Affecting Communication: Hearing impaired  Cognition Arousal: Alert Behavior During Therapy: WFL for tasks assessed/performed   PT - Cognitive impairments: No apparent impairments      Following commands: Impaired      Cueing Cueing Techniques: Verbal cues, Tactile cues    PT Goals (current goals can now be found in the care plan section) Acute Rehab PT Goals Patient Stated Goal: did not state Progress towards PT goals: Progressing toward goals    Frequency    Min 3X/week       Co-evaluation     PT goals addressed during session: Mobility/safety with mobility;Balance;Proper use of DME;Strengthening/ROM        AM-PAC PT 6 Clicks Mobility   Outcome Measure  Help needed turning from your back to your side while in a flat bed without using bedrails?: A Little Help needed moving from lying on your back to sitting on the side of a flat bed without  using bedrails?: A Little Help needed moving to and from a bed to a chair (including a wheelchair)?: A Little Help needed standing up from a  chair using your arms (e.g., wheelchair or bedside chair)?: A Little Help needed to walk in hospital room?: A Little Help needed climbing 3-5 steps with a railing? : A Little 6 Click Score: 18    End of Session   Activity Tolerance: Patient tolerated treatment well Patient left: in chair;with call bell/phone within reach;with family/visitor present Nurse Communication: Mobility status PT Visit Diagnosis: Unsteadiness on feet (R26.81);Muscle weakness (generalized) (M62.81)     Time: 1040-1107 PT Time Calculation (min) (ACUTE ONLY): 27 min  Charges:    $Gait Training: 8-22 mins $Therapeutic Activity: 8-22 mins PT General Charges $$ ACUTE PT VISIT: 1 Visit                     Rankin Essex PTA 05/05/24, 1:01 PM

## 2024-05-05 NOTE — Progress Notes (Signed)
 Pt transfer to room 231, report given to claudia RN.

## 2024-05-05 NOTE — Plan of Care (Signed)
  Problem: Skin Integrity: Goal: Risk for impaired skin integrity will decrease Outcome: Progressing   Problem: Clinical Measurements: Goal: Diagnostic test results will improve Outcome: Progressing Goal: Cardiovascular complication will be avoided Outcome: Progressing   Problem: Activity: Goal: Risk for activity intolerance will decrease Outcome: Progressing   Problem: Nutrition: Goal: Adequate nutrition will be maintained Outcome: Progressing   Problem: Elimination: Goal: Will not experience complications related to bowel motility Outcome: Progressing

## 2024-05-06 DIAGNOSIS — I214 Non-ST elevation (NSTEMI) myocardial infarction: Secondary | ICD-10-CM | POA: Diagnosis not present

## 2024-05-06 DIAGNOSIS — I429 Cardiomyopathy, unspecified: Secondary | ICD-10-CM | POA: Diagnosis not present

## 2024-05-06 DIAGNOSIS — I509 Heart failure, unspecified: Secondary | ICD-10-CM | POA: Diagnosis not present

## 2024-05-06 DIAGNOSIS — I5023 Acute on chronic systolic (congestive) heart failure: Secondary | ICD-10-CM | POA: Diagnosis not present

## 2024-05-06 DIAGNOSIS — J9601 Acute respiratory failure with hypoxia: Secondary | ICD-10-CM | POA: Diagnosis not present

## 2024-05-06 DIAGNOSIS — J9602 Acute respiratory failure with hypercapnia: Secondary | ICD-10-CM

## 2024-05-06 DIAGNOSIS — I4821 Permanent atrial fibrillation: Secondary | ICD-10-CM | POA: Diagnosis not present

## 2024-05-06 LAB — BASIC METABOLIC PANEL WITH GFR
Anion gap: 10 (ref 5–15)
BUN: 31 mg/dL — ABNORMAL HIGH (ref 8–23)
CO2: 29 mmol/L (ref 22–32)
Calcium: 8.7 mg/dL — ABNORMAL LOW (ref 8.9–10.3)
Chloride: 101 mmol/L (ref 98–111)
Creatinine, Ser: 0.91 mg/dL (ref 0.44–1.00)
GFR, Estimated: 60 mL/min — ABNORMAL LOW (ref >60)
Glucose, Bld: 109 mg/dL — ABNORMAL HIGH (ref 70–99)
Potassium: 3.8 mmol/L (ref 3.5–5.1)
Sodium: 140 mmol/L (ref 135–145)

## 2024-05-06 LAB — PROTIME-INR
INR: 1.5 — ABNORMAL HIGH (ref 0.8–1.2)
Prothrombin Time: 19 s — ABNORMAL HIGH (ref 11.4–15.2)

## 2024-05-06 LAB — GLUCOSE, CAPILLARY
Glucose-Capillary: 116 mg/dL — ABNORMAL HIGH (ref 70–99)
Glucose-Capillary: 166 mg/dL — ABNORMAL HIGH (ref 70–99)

## 2024-05-06 MED ORDER — WARFARIN SODIUM 2 MG PO TABS
2.0000 mg | ORAL_TABLET | Freq: Once | ORAL | Status: DC
Start: 2024-05-06 — End: 2024-05-06
  Filled 2024-05-06: qty 1

## 2024-05-06 MED ORDER — SENNA 8.6 MG PO TABS: 1.0000 | ORAL_TABLET | Freq: Every day | ORAL | 0 refills | Status: AC | PRN

## 2024-05-06 MED ORDER — FUROSEMIDE 40 MG PO TABS: 40.0000 mg | ORAL_TABLET | Freq: Every day | ORAL | 1 refills | Status: AC

## 2024-05-06 MED ORDER — DIGOXIN 125 MCG PO TABS: 0.1250 mg | ORAL_TABLET | Freq: Every day | ORAL | 1 refills | Status: DC

## 2024-05-06 MED ORDER — ALPRAZOLAM 0.25 MG PO TABS: 0.2500 mg | ORAL_TABLET | Freq: Three times a day (TID) | ORAL | 0 refills | Status: AC | PRN

## 2024-05-06 NOTE — Discharge Summary (Signed)
 Physician Discharge Summary   [name] MRN: [id number] DOB: 12-27-1930  Admit date:     05/01/2024  Discharge date: 05/06/24  Discharge Physician: Amaryllis Dare   PCP: Fernand Fredy RAMAN, MD   Recommendations at discharge:  If patient decided to continue with Coumadin  then will need INR monitoring. Monitor BMP if continuing diuresis with Lasix  Follow-up with primary care provider Follow-up with cardiology  Discharge Diagnoses: Principal Problem:   NSTEMI (non-ST elevated myocardial infarction) (HCC) Active Problems:   Acute on chronic HFrEF (heart failure with reduced ejection fraction) (HCC)   Chronic atrial fibrillation with RVR (HCC)   Acute respiratory failure with hypoxia and hypercapnia (HCC)   Transaminitis   CKD (chronic kidney disease), stage III (HCC)   Hypertension   Hypothyroidism   Hyperglycemia   GERD (gastroesophageal reflux disease)   Dyslipidemia   Atrial fibrillation with rapid ventricular response (HCC)   Acute on chronic congestive heart failure (HCC)   Ischemic cardiomyopathy   Hospital Course: Taken from prior notes.  Karen Cunningham is a 89 y.o. female with medical history significant of chronic A-fib on Coumadin , HFpEF, CKD stage III b, CAD status post stenting in 2008 in 2010, hypothyroidism, Presented with new onset chest pain and shortness of breath and palpitations.  On presentation patient was found to be in A-fib with RVR, heart rate up to 170, hypoxic and she was placed on BiPAP for increased work of breathing.  Chest x-ray with pulmonary edema, mild leukocytosis and transaminitis.  T. bili above 18,000.  Cardiology was consulted and patient was started on heparin  infusion, received IV Lasix  and metoprolol  pushes.  As heart rate remained uncontrolled patient was started on amiodarone  infusion and received 2 doses of digoxin . Cardiology was suggesting cardiac cath after improving respiratory status but family would like to do  noninvasive management only  8/12: Patient was able to wean to 10 L of oxygen from BiPAP but remained tachycardic and tachypneic, heart rate remained uncontrolled and mostly in 150s, labs with slight worsening of leukocytosis at 12.6, troponin peaked at 18,600. Echocardiogram with new diagnosis of HFrEF, EF of 25 to 30%, did show regional wall motion abnormalities.  Also shows moderately reduced RV function and normal pulmonary arterial pressure, moderate to severely dilated both atrium, moderate pleural effusion on left, mild to moderate MR, moderate to severe AS.   Palliative care was also consulted to discuss goals of care, patient with poor prognosis.  CODE STATUS changed to DNR and to continue current level of care.  8/13: Currently stable but continued to need 10 L of oxygen, heart rate now in the low 100s.  Worsening leukocytosis likely reactive secondary to NSTEMI, slight increase in BUN to 39 and creatinine to 1.01.  Continuing current management.  Digoxin  was added by cardiology.  Family would like to continue current level of care without any escalation at this time and they will decide in next couple of days depending on which direction she will go.  8/14: Hemodynamic stable and improving oxygen requirement, currently on 2 L of oxygen.  PT is recommending SNF-patient wants to go home, family has not final decision regarding disposition.  If remains stable likely can be discharged tomorrow. Heparin  infusion was discontinued and patient is now back on warfarin, amiodarone  infusion has been switched with p.o.  8/15: Clinically stable, heart rate variable and mostly remained in the low 100s, family is now leaning more towards home with hospice.  8/16: Remained hemodynamically stable, on room  air.  Heart rate seems controlled.  Cardiology discontinue amiodarone  and increase the dose of metoprolol .  She will continue with digoxin . Patient will discuss with hospice physician regarding the use of  Coumadin  versus Eliquis.  Patient is being discharged home with hospice with comfort focused care.  She will continue current medications and follow-up with her providers for further assistance.  Assessment and Plan: * NSTEMI (non-ST elevated myocardial infarction) (HCC) Chest pain improved, troponin peaked at 18,600. Echocardiogram with regional wall motion abnormalities-consistent with NSTEMI. Family does not want any invasive procedure. - Cardiology is on board -Completed 48 hours of heparin  infusion. -Continue with medical management  Acute on chronic HFrEF (heart failure with reduced ejection fraction) (HCC) Likely secondary to NSTEMI, echocardiogram with EF of 25 to 30% with regional wall motion abnormalities, biventricular failure and biatrial dilatation with moderate MR, TR and moderate to severe AS. EF was normal a month ago and it was mild MR, TR and AS. BNP elevated and chest x-ray with concern of pulmonary vascular congestion. - IV Lasix  switched with p.o.  Chronic atrial fibrillation with RVR (HCC) Heart rate slowly improving mostly in high 90s -Continue with digoxin  -Amiodarone  was discontinued by cardiology -Continue with metoprolol  -Patient was restarted on home Coumadin  .  Acute respiratory failure with hypoxia and hypercapnia (HCC) Initially requiring BiPAP, VBG with concern of hypoxia and hypercapnia.  Repeat VBG with improvement in hypercapnia and pH.  No baseline oxygen use,  Now improved and patient is on room air -Continue supplemental oxygen-wean as tolerated  Transaminitis Likely secondary to hepatic congestion with acute heart failure. - Continue to monitor  CKD (chronic kidney disease), stage III (HCC) Renal function currently around baseline.  History of CKD stage III A.  - Monitor renal function while patient is being diuresed -Avoid nephrotoxins  Hypertension Continue with metoprolol  and Lasix   Hypothyroidism TSH normal. - Continue with home  Synthroid   Hyperglycemia No prior diagnosis of diabetes, A1c of 6.2. - Continue to monitor  Dyslipidemia - Continue with statin  GERD (gastroesophageal reflux disease) - Continue with PPI  Consultants: Cardiology.  Palliative care Procedures performed: None Disposition: Hospice care Diet recommendation:  Discharge Diet Orders (From admission, onward)     Start     Ordered   05/06/24 0000  Diet - low sodium heart healthy        05/06/24 1333           Cardiac diet DISCHARGE MEDICATION: Allergies as of 05/06/2024       Reactions   Codeine Nausea And Vomiting   Nsaids    Dr instructed to avoid due to kidney issues    Ibuprofen Other (See Comments)   Dr instructed to avoid due to kidney issues    Prednisone Other (See Comments)   ELEVATED BLOOD PRESSURE.        Medication List     STOP taking these medications    Dilt-XR 240 MG 24 hr capsule Generic drug: diltiazem    losartan  50 MG tablet Commonly known as: COZAAR        TAKE these medications    acetaminophen  500 MG tablet Commonly known as: TYLENOL  Take 1 tablet (500 mg total) by mouth every 6 (six) hours as needed for moderate pain (pain).   ALPRAZolam  0.25 MG tablet Commonly known as: XANAX  Take 1 tablet (0.25 mg total) by mouth 3 (three) times daily as needed for anxiety.   atorvastatin  40 MG tablet Commonly known as: LIPITOR TAKE 1 TABLET BY MOUTH AT  BEDTIME   digoxin  0.125 MG tablet Commonly known as: LANOXIN  Take 1 tablet (0.125 mg total) by mouth daily. Start taking on: May 07, 2024   fluticasone  50 MCG/ACT nasal spray Commonly known as: FLONASE  Place 1 spray into both nostrils daily.   furosemide  40 MG tablet Commonly known as: LASIX  Take 1 tablet (40 mg total) by mouth daily. Start taking on: May 07, 2024 What changed:  medication strength how much to take when to take this reasons to take this   Iron 325 (65 Fe) MG Tabs Take 325 mg by mouth daily.   levothyroxine   88 MCG tablet Commonly known as: SYNTHROID  Take 1 tablet (88 mcg total) by mouth every morning.   Loratadine  10 MG Caps Take 1 capsule (10 mg total) by mouth daily.   Melatonin 10 MG Tabs Take 10 mg by mouth at bedtime.   metoprolol  tartrate 50 MG tablet Commonly known as: LOPRESSOR  Take 1 tablet (50 mg total) by mouth 2 (two) times daily.   omeprazole  20 MG capsule Commonly known as: PRILOSEC Take 1 capsule (20 mg total) by mouth daily.   senna 8.6 MG Tabs tablet Commonly known as: SENOKOT Take 1 tablet (8.6 mg total) by mouth daily as needed for mild constipation.   VITAMIN B COMPLEX PO Take by mouth.   Vitamin D3 25 MCG (1000 UT) Caps Take 1,000 Units by mouth daily.   warfarin 2 MG tablet Commonly known as: COUMADIN  Take as directed. If you are unsure how to take this medication, talk to your nurse or doctor. Original instructions: TAKE 1 TABLET BY MOUTH IN THE EVENING OR AS DIRECTED BY ANTICOAGULATION CLINIC        Follow-up Information     [facility]. Go on 05/12/2024.   Specialty: Cardiology Why: Hospital Follow-Up 05/12/24 @ 11:30  Please bring all medications to follow-up appointment [facility], [address] Second Floor Free Valet Parking at the door. Contact information: [address] [place]  [address] [phone]        Fernand Fredy RAMAN, MD. Schedule an appointment as soon as possible for a visit.   Specialty: Internal Medicine Contact information: [address] [place] [address] [phone]                Discharge Exam: Filed Weights   05/04/24 0500 05/05/24 0500 05/06/24 0500  Weight: 60 kg 60.6 kg 60.2 kg   General.  Frail elderly lady, in no acute distress. Pulmonary.  Lungs clear bilaterally, normal respiratory effort. CV.  Irregularly irregular Abdomen.  Soft, nontender, nondistended, BS positive. CNS.  Alert and oriented .  No focal  neurologic deficit. Extremities.  No edema,  pulses intact and symmetrical. Psychiatry.  Judgment and insight appears normal.   Condition at discharge: stable  The results of significant diagnostics from this hospitalization (including imaging, microbiology, ancillary and laboratory) are listed below for reference.   Imaging Studies: ECHOCARDIOGRAM COMPLETE Result Date: 05/02/2024    ECHOCARDIOGRAM REPORT   Patient Name:   JILLEEN ESSNER Date of Exam: 05/01/2024 Medical Rec #:  [id number]       Height:       60.0 in Accession #:    [id number]      Weight:       110.4 lb Date of Birth:  13-Aug-1931       BSA:          1.450 m Patient Age:  89 years        BP:           109/86 mmHg Patient Gender: F               HR:           117 bpm. Exam Location:  [facility] Procedure: 2D Echo, Cardiac Doppler and Color Doppler (Both Spectral and Color            Flow Doppler were utilized during procedure). Indications:     NSTEMI I21.4  History:         Patient has prior history of Echocardiogram examinations, most                  recent 04/03/2024. CHF, Previous Myocardial Infarction and CAD,                  Arrythmias:Atrial Fibrillation, Signs/Symptoms:Shortness of                  Breath; Risk Factors:Hypertension and Dyslipidemia.  Sonographer:     Thea Norlander RCS Referring Phys:  [id number] CORT ONEIDA MANA Diagnosing Phys: [name] MD IMPRESSIONS  1. Left ventricular ejection fraction, by estimation, is 25 to 30%. The left ventricle has severely decreased function. The left ventricle demonstrates regional wall motion abnormalities (see scoring diagram/findings for description). Left ventricular diastolic function could not be evaluated.  2. Right ventricular systolic function is moderately reduced. The right ventricular size is normal. There is normal pulmonary artery systolic pressure.  3. Left atrial size was moderately dilated.  4. Right atrial size was severely dilated.  5. Moderate pleural effusion in the  left lateral region.  6. The mitral valve is degenerative. Mild to moderate mitral valve regurgitation. Severe mitral annular calcification.  7. Tricuspid valve regurgitation is mild to moderate.  8. The aortic valve has an indeterminant number of cusps. There is mild calcification of the aortic valve. There is moderate thickening of the aortic valve. Aortic valve regurgitation is not visualized. Moderate to severe low-flow/low-gradient aortic valve stenosis. Aortic valve area, by VTI measures 0.55 cm. Aortic valve mean gradient measures 11.0 mmHg. DI 0.24.  9. The inferior vena cava is normal in size with <50% respiratory variability, suggesting right atrial pressure of 8 mmHg. FINDINGS  Left Ventricle: Left ventricular ejection fraction, by estimation, is 25 to 30%. The left ventricle has severely decreased function. The left ventricle demonstrates regional wall motion abnormalities. The left ventricular internal cavity size was normal  in size. There is no left ventricular hypertrophy. Left ventricular diastolic function could not be evaluated due to atrial fibrillation. Left ventricular diastolic function could not be evaluated.  LV Wall Scoring: The entire apex is akinetic. The mid anteroseptal segment, mid inferolateral segment, mid anterolateral segment, mid inferoseptal segment, mid anterior segment, and mid inferior segment are hypokinetic. The basal anteroseptal segment, basal inferolateral segment, basal anterolateral segment, basal anterior segment, basal inferior segment, and basal inferoseptal segment are normal. Right Ventricle: The right ventricular size is normal. No increase in right ventricular wall thickness. Right ventricular systolic function is moderately reduced. There is normal pulmonary artery systolic pressure. The tricuspid regurgitant velocity is 2.45 m/s, and with an assumed right atrial pressure of 8 mmHg, the estimated right ventricular systolic pressure is 32.0 mmHg. Left Atrium:  Left atrial size was moderately dilated. Right Atrium: Right atrial size was severely dilated. Pericardium: There is no evidence of pericardial effusion. Mitral Valve: The mitral valve is degenerative in  appearance. Severe mitral annular calcification. Mild to moderate mitral valve regurgitation. MV peak gradient, 10.7 mmHg. The mean mitral valve gradient is 3.0 mmHg. Tricuspid Valve: The tricuspid valve is normal in structure. Tricuspid valve regurgitation is mild to moderate. Aortic Valve: The aortic valve has an indeterminant number of cusps. There is mild calcification of the aortic valve. There is moderate thickening of the aortic valve. Aortic valve regurgitation is not visualized. Moderate to severe aortic stenosis is present. Aortic valve mean gradient measures 11.0 mmHg. Aortic valve peak gradient measures 19.4 mmHg. Aortic valve area, by VTI measures 0.55 cm. Pulmonic Valve: The pulmonic valve was normal in structure. Pulmonic valve regurgitation is trivial. No evidence of pulmonic stenosis. Aorta: The aortic root and ascending aorta are structurally normal, with no evidence of dilitation. Pulmonary Artery: The pulmonary artery is of normal size. Venous: The inferior vena cava is normal in size with less than 50% respiratory variability, suggesting right atrial pressure of 8 mmHg. IAS/Shunts: The interatrial septum was not well visualized. Additional Comments: There is a moderate pleural effusion in the left lateral region.  LEFT VENTRICLE PLAX 2D LVIDd:         3.90 cm   Diastology LVIDs:         3.00 cm   LV e' medial:    6.85 cm/s LV PW:         1.00 cm   LV E/e' medial:  23.6 LV IVS:        0.90 cm   LV e' lateral:   11.00 cm/s LVOT diam:     1.70 cm   LV E/e' lateral: 14.7 LV SV:         21 LV SV Index:   15 LVOT Area:     2.27 cm  RIGHT VENTRICLE            IVC RV S prime:     8.27 cm/s  IVC diam: 1.70 cm TAPSE (M-mode): 1.0 cm LEFT ATRIUM             Index        RIGHT ATRIUM           Index LA  diam:        4.70 cm 3.24 cm/m   RA Area:     27.70 cm LA Vol (A2C):   59.2 ml 40.82 ml/m  RA Volume:   102.00 ml 70.33 ml/m LA Vol (A4C):   67.5 ml 46.54 ml/m LA Biplane Vol: 64.0 ml 44.13 ml/m  AORTIC VALVE                     PULMONIC VALVE AV Area (Vmax):    0.72 cm      PR End Diast Vel: 5.02 msec AV Area (Vmean):   0.66 cm AV Area (VTI):     0.55 cm AV Vmax:           220.50 cm/s AV Vmean:          151.500 cm/s AV VTI:            0.390 m AV Peak Grad:      19.4 mmHg AV Mean Grad:      11.0 mmHg LVOT Vmax:         70.40 cm/s LVOT Vmean:        44.300 cm/s LVOT VTI:          0.094 m LVOT/AV VTI ratio: 0.24  AORTA Ao Root diam: 2.90  cm Ao Asc diam:  3.00 cm MITRAL VALVE                TRICUSPID VALVE MV Area (PHT): 6.65 cm     TR Peak grad:   24.0 mmHg MV Area VTI:   0.68 cm     TR Vmax:        245.00 cm/s MV Peak grad:  10.7 mmHg MV Mean grad:  3.0 mmHg     SHUNTS MV Vmax:       1.64 m/s     Systemic VTI:  0.09 m MV Vmean:      79.0 cm/s    Systemic Diam: 1.70 cm MV Decel Time: 114 msec MR Peak grad: 68.6 mmHg MR Vmax:      414.00 cm/s MV E velocity: 162.00 cm/s [name] MD Electronically signed by [name] MD Signature Date/Time: 05/02/2024/9:43:46 AM    Final    DG Chest 1 View Result Date: 05/02/2024 CLINICAL DATA:  Congestive heart failure. EXAM: CHEST  1 VIEW COMPARISON:  May 01, 2024. FINDINGS: Stable cardiomegaly. Minimal to mild interstitial densities are noted concerning for possible pulmonary edema. Small pleural effusions are noted with associated atelectasis. Bony thorax is unremarkable. IMPRESSION: Minimal to mild interstitial densities concerning for possible pulmonary edema. Small pleural effusions. Electronically Signed   By: Lynwood Landy Raddle M.D.   On: 05/02/2024 08:32   DG Chest Portable 1 View Result Date: 05/01/2024 CLINICAL DATA:  Shortness of breath EXAM: PORTABLE CHEST 1 VIEW COMPARISON:  Chest radiograph dated 12/02/2022 FINDINGS: Normal lung volumes.  Diffusely increased interstitial opacities. Similar blunting of bilateral costophrenic angles. No pneumothorax. Similar enlarged cardiomediastinal silhouette. No acute osseous abnormality. IMPRESSION: 1. Diffusely increased interstitial opacities, which may represent pulmonary edema or atypical infection. 2. Similar blunting of bilateral costophrenic angles, which may represent small pleural effusions. Electronically Signed   By: Limin  Xu M.D.   On: 05/01/2024 08:25    Microbiology: Results for orders placed or performed during the hospital encounter of 05/01/24  Resp panel by RT-PCR (RSV, Flu A&B, Covid) Anterior Nasal Swab     Status: None   Collection Time: 05/01/24  9:04 AM   Specimen: Anterior Nasal Swab  Result Value Ref Range Status   SARS Coronavirus 2 by RT PCR NEGATIVE NEGATIVE Final    Comment: (NOTE) SARS-CoV-2 target nucleic acids are NOT DETECTED.  The SARS-CoV-2 RNA is generally detectable in upper respiratory specimens during the acute phase of infection. The lowest concentration of SARS-CoV-2 viral copies this assay can detect is 138 copies/mL. A negative result does not preclude SARS-Cov-2 infection and should not be used as the sole basis for treatment or other patient management decisions. A negative result may occur with  improper specimen collection/handling, submission of specimen other than nasopharyngeal swab, presence of viral mutation(s) within the areas targeted by this assay, and inadequate number of viral copies(<138 copies/mL). A negative result must be combined with clinical observations, patient history, and epidemiological information. The expected result is Negative.  Fact Sheet for Patients:  BloggerCourse.com  Fact Sheet for Healthcare Providers:  SeriousBroker.it  This test is no t yet approved or cleared by the [place]  FDA and  has been authorized for detection and/or diagnosis of  SARS-CoV-2 by FDA under an Emergency Use Authorization (EUA). This EUA will remain  in effect (meaning this test can be used) for the duration of the COVID-19 declaration under Section 564(b)(1) of the Act, 21 U.S.C.section 360bbb-3(b)(1), unless the authorization  is terminated  or revoked sooner.       Influenza A by PCR NEGATIVE NEGATIVE Final   Influenza B by PCR NEGATIVE NEGATIVE Final    Comment: (NOTE) The Xpert Xpress SARS-CoV-2/FLU/RSV plus assay is intended as an aid in the diagnosis of influenza from Nasopharyngeal swab specimens and should not be used as a sole basis for treatment. Nasal washings and aspirates are unacceptable for Xpert Xpress SARS-CoV-2/FLU/RSV testing.  Fact Sheet for Patients: BloggerCourse.com  Fact Sheet for Healthcare Providers: SeriousBroker.it  This test is not yet approved or cleared by the [place]  FDA and has been authorized for detection and/or diagnosis of SARS-CoV-2 by FDA under an Emergency Use Authorization (EUA). This EUA will remain in effect (meaning this test can be used) for the duration of the COVID-19 declaration under Section 564(b)(1) of the Act, 21 U.S.C. section 360bbb-3(b)(1), unless the authorization is terminated or revoked.     Resp Syncytial Virus by PCR NEGATIVE NEGATIVE Final    Comment: (NOTE) Fact Sheet for Patients: BloggerCourse.com  Fact Sheet for Healthcare Providers: SeriousBroker.it  This test is not yet approved or cleared by the [place]  FDA and has been authorized for detection and/or diagnosis of SARS-CoV-2 by FDA under an Emergency Use Authorization (EUA). This EUA will remain in effect (meaning this test can be used) for the duration of the COVID-19 declaration under Section 564(b)(1) of the Act, 21 U.S.C. section 360bbb-3(b)(1), unless the authorization is terminated  or revoked.  Performed at [facility], [address] [place], [place] [address]     Labs: CBC: Recent Labs  Lab 05/01/24 [phone] 05/02/24 0339 05/03/24 0455 05/04/24 0522 05/05/24 0439  WBC 11.7* 12.6* 15.3* 9.9 9.3  NEUTROABS 6.8  --   --   --   --   HGB 14.1 12.9 13.7 14.0 13.9  HCT 46.1* 40.7 44.7 43.5 44.3  MCV 93.7 90.4 92.5 89.1 90.2  PLT 235 197 203 179 181   Basic Metabolic Panel: Recent Labs  Lab 05/01/24 0903 05/02/24 0339 05/03/24 0455 05/04/24 0522 05/05/24 0439 05/06/24 0602  NA  --  142 139 142 143 140  K  --  4.4 4.2 3.4* 4.2 3.8  CL  --  106 100 101 104 101  CO2  --  22 26 29 29 29   GLUCOSE  --  141* 127* 119* 121* 109*  BUN  --  28* 39* 43* 39* 31*  CREATININE  --  0.93 1.01* 0.90 0.93 0.91  CALCIUM   --  9.4 9.0 8.6* 9.1 8.7*  MG 2.0  --   --   --   --   --   PHOS 4.2  --   --   --   --   --    Liver Function Tests: Recent Labs  Lab 05/01/24 0728 05/02/24 0339  AST 105* 97*  ALT 47* 41  ALKPHOS 131* 96  BILITOT 0.9 1.2  PROT 8.0 7.1  ALBUMIN 4.1 3.5   CBG: Recent Labs  Lab 05/05/24 1122 05/05/24 1631 05/05/24 2031 05/06/24 0855 05/06/24 1318  GLUCAP 109* 133* 188* 116* 166*    Discharge time spent: greater than 30 minutes.  This record has been created using Conservation officer, historic buildings. Errors have been sought and corrected,but may not always be located. Such creation errors do not reflect on the standard of care.   Signed: Amaryllis Dare, MD [facility] 05/06/2024

## 2024-05-06 NOTE — Progress Notes (Signed)
 Rounding Note   Patient Name: Karen Cunningham Date of Encounter: 05/06/2024  [facility] Cardiologist: [name], MD   Subjective No acute events overnight, daughter at bedside.  Denies palpitations.  Eating breakfast comfortably.  Hoping to be discharged soon.  Scheduled Meds:  atorvastatin   40 mg Oral QHS   digoxin   0.125 mg Oral Daily   enoxaparin  (LOVENOX ) injection  60 mg Subcutaneous Q12H   feeding supplement (GLUCERNA SHAKE)  237 mL Oral TID BM   fluticasone   1 spray Each Nare Daily   furosemide   40 mg Oral Daily   insulin  aspart  0-9 Units Subcutaneous TID WC   levothyroxine   88 mcg Oral Q0600   loratadine   5 mg Oral Daily   melatonin  10 mg Oral QHS   metoprolol  tartrate  50 mg Oral BID   pantoprazole   40 mg Oral Daily   sodium chloride  flush  3 mL Intravenous Q12H   warfarin  2 mg Oral ONCE-1600   Warfarin - Pharmacist Dosing Inpatient   Does not apply q1600   Continuous Infusions:  PRN Meds: acetaminophen , ALPRAZolam , ondansetron  (ZOFRAN ) IV, senna, sodium chloride  flush   Vital Signs  Vitals:   05/06/24 0500 05/06/24 0823 05/06/24 1032 05/06/24 1039  BP:  109/72 109/72   Pulse:  87 87 87  Resp:  20    Temp:  (!) 97.5 F (36.4 C)    TempSrc:  Oral    SpO2:  94%    Weight: 60.2 kg     Height:        Intake/Output Summary (Last 24 hours) at 05/06/2024 1408 Last data filed at 05/06/2024 1040 Gross per 24 hour  Intake 140 ml  Output 1250 ml  Net -1110 ml      05/06/2024    5:00 AM 05/05/2024    5:00 AM 05/04/2024    5:00 AM  Last 3 Weights  Weight (lbs) 132 lb 11.5 oz 133 lb 9.6 oz 132 lb 4.4 oz  Weight (kg) 60.2 kg 60.6 kg 60 kg      Telemetry Atrial fibrillation, heart rate 98-104- Personally Reviewed  ECG   - Personally Reviewed  Physical Exam  GEN: No acute distress.  Elderly female, frail Neck: No JVD Cardiac: Irregular irregular, systolic murmur Respiratory: Clear to auscultation bilaterally. GI: Soft, nontender,  non-distended  MS: No edema; No deformity. Neuro:  Nonfocal  Psych: Normal affect   Labs High Sensitivity Troponin:   Recent Labs  Lab 05/01/24 0728 05/01/24 0903 05/02/24 0621  TROPONINIHS 18,600* 14,167* 11,310*     Chemistry Recent Labs  Lab 05/01/24 0728 05/01/24 0903 05/02/24 0339 05/03/24 0455 05/04/24 0522 05/05/24 0439 05/06/24 0602  NA 144  --  142   < > 142 143 140  K 3.6  --  4.4   < > 3.4* 4.2 3.8  CL 109  --  106   < > 101 104 101  CO2 24  --  22   < > 29 29 29   GLUCOSE 263*  --  141*   < > 119* 121* 109*  BUN 19  --  28*   < > 43* 39* 31*  CREATININE 0.98  --  0.93   < > 0.90 0.93 0.91  CALCIUM  9.1  --  9.4   < > 8.6* 9.1 8.7*  MG  --  2.0  --   --   --   --   --   PROT 8.0  --  7.1  --   --   --   --   ALBUMIN 4.1  --  3.5  --   --   --   --   AST 105*  --  97*  --   --   --   --   ALT 47*  --  41  --   --   --   --   ALKPHOS 131*  --  96  --   --   --   --   BILITOT 0.9  --  1.2  --   --   --   --   GFRNONAA 54*  --  58*   < > >60 58* 60*  ANIONGAP 11  --  14   < > 12 10 10    < > = values in this interval not displayed.    Lipids No results for input(s): CHOL, TRIG, HDL, LABVLDL, LDLCALC, CHOLHDL in the last 168 hours.  Hematology Recent Labs  Lab 05/03/24 0455 05/04/24 0522 05/05/24 0439  WBC 15.3* 9.9 9.3  RBC 4.83 4.88 4.91  HGB 13.7 14.0 13.9  HCT 44.7 43.5 44.3  MCV 92.5 89.1 90.2  MCH 28.4 28.7 28.3  MCHC 30.6 32.2 31.4  RDW 15.4 15.2 15.3  PLT 203 179 181   Thyroid   Recent Labs  Lab 05/01/24 0903  TSH 3.030    BNP Recent Labs  Lab 05/01/24 0728  BNP 955.8*    DDimer No results for input(s): DDIMER in the last 168 hours.   Radiology  No results found.  Cardiac Studies Echo EF 25 to 30%  Patient Profile   89 y.o. female with history of CAD/PCI mid LAD, permanent atrial fibrillation, CKD, mild aortic stenosis being seen due to dyspnea and A-fib RVR.  Assessment & Plan   Permanent atrial  fibrillation - Continue Lopressor  50 mg twice daily, digoxin . - Coumadin . - Avoid amiodarone  if possible due to of target effects.  2.  Cardiomyopathy - Low BP preventing addition of GDMT at this time - Lopressor  being used for better heart rate control.  Patient being planned for home with home hospice.  No additional cardiac workup at this time.  Okay for discharge from a cardiac perspective on current medications.    Signed, Redell Cave, MD  05/06/2024, 2:08 PM

## 2024-05-06 NOTE — Progress Notes (Signed)
 Occupational Therapy Treatment Patient Details Name: Karen Cunningham MRN: [id number] DOB: 24-Oct-1930 Today's Date: 05/06/2024   History of present illness 89 y/o female presented to ED on 05/01/24 for respiratory distress and chest pain. Admitted for NSTEMI and acute on chronic HFpEF decompensation. PMH: Afib, HFpEF, CKD stage IIIb, CAD s/p stent   OT comments  Karen Cunningham was seen for OT treatment on this date. Upon arrival to room pt seated in recliner, agreeable to OT Tx session. OT facilitated ADL management with education and assistance as described below. See ADL section for additional details regarding occupational performance. Pt continues to be functionally limited by generalized weakness, decreased activity tolerance, and decreased safety awareness. Pt/family members return verbalizes understanding of education provided t/o session. Pt is progressing toward OT goals and continues to benefit from skilled OT services to maximize return to PLOF and minimize risk of future falls, injury, caregiver burden, and readmission. Will continue to follow POC as written. Discharge recommendation updated to HHOT.         If plan is discharge home, recommend the following:  A lot of help with walking and/or transfers;A lot of help with bathing/dressing/bathroom;Supervision due to cognitive status   Equipment Recommendations  BSC/3in1;Wheelchair (measurements OT)    Recommendations for Other Services      Precautions / Restrictions Precautions Precautions: Fall Recall of Precautions/Restrictions: Impaired Restrictions Weight Bearing Restrictions Per Provider Order: No       Mobility Bed Mobility               General bed mobility comments: NT pt in recliner at start/end of session    Transfers Overall transfer level: Needs assistance Equipment used: Rolling walker (2 wheels) Transfers: Sit to/from Stand Sit to Stand: Contact guard assist     Step pivot transfers: Min assist,  Contact guard assist     General transfer comment: Progresses to CGA for safety after initial MIN A.     Balance Overall balance assessment: Needs assistance Sitting-balance support: Feet supported, No upper extremity supported Sitting balance-Leahy Scale: Good Sitting balance - Comments: steady sitting, reaching inside BOS.   Standing balance support: Bilateral upper extremity supported, During functional activity, Reliant on assistive device for balance, Single extremity supported, No upper extremity supported Standing balance-Leahy Scale: Fair Standing balance comment: reliant on RW during dynamic standing activity, able to stand briefly at sink without UE support for [facility].                           ADL either performed or assessed with clinical judgement   ADL Overall ADL's : Needs assistance/impaired Eating/Feeding: Sitting;Modified independent   Grooming: Wash/dry hands;Wash/dry face;Standing;Contact guard assist       Lower Body Bathing: Maximal assistance;Sit to/from stand       Lower Body Dressing: Minimal assistance;Sit to/from stand Lower Body Dressing Details (indicate cue type and reason): to push down and pull up mesh underwear. Toilet Transfer: Rolling walker (2 wheels);Minimal assistance;Ambulation;BSC/3in1 Toilet Transfer Details (indicate cue type and reason): With cueing for safe transfer technique Toileting- Clothing Manipulation and Hygiene: Maximal assistance;Sit to/from stand Toileting - Clothing Manipulation Details (indicate cue type and reason): To perform peri care after bladder/bowel movement on [facility]     Functional mobility during ADLs: Contact guard assist;Cueing for sequencing;Rolling walker (2 wheels);Cueing for safety General ADL Comments: Educated on safe transfer technique and safe use of AE/DME for ADL management t/o session.    Extremity/Trunk Assessment  Vision Patient Visual Report: No change from baseline      Perception     Praxis     Communication Communication Communication: Impaired Factors Affecting Communication: Hearing impaired   Cognition Arousal: Alert Behavior During Therapy: WFL for tasks assessed/performed Cognition: Cognition impaired                               Following commands: Impaired Following commands impaired: Follows one step commands with increased time      Cueing   Cueing Techniques: Verbal cues, Tactile cues  Exercises Other Exercises Other Exercises: OT facilitated ADL management with pt/family education and assist as described above. See ADL section for detail.    Shoulder Instructions       General Comments      Pertinent Vitals/ Pain       Pain Assessment Faces Pain Scale: No hurt  Home Living                                          Prior Functioning/Environment              Frequency  Min 3X/week        Progress Toward Goals  OT Goals(current goals can now be found in the care plan section)  Progress towards OT goals: Progressing toward goals  Acute Rehab OT Goals Patient Stated Goal: go home OT Goal Formulation: With patient Time For Goal Achievement: 05/17/24 Potential to Achieve Goals: Fair  Plan      Co-evaluation                 AM-PAC OT 6 Clicks Daily Activity     Outcome Measure   Help from another person eating meals?: None Help from another person taking care of personal grooming?: A Little Help from another person toileting, which includes using toliet, bedpan, or urinal?: A Lot Help from another person bathing (including washing, rinsing, drying)?: A Lot Help from another person to put on and taking off regular upper body clothing?: A Little Help from another person to put on and taking off regular lower body clothing?: A Lot 6 Click Score: 16    End of Session Equipment Utilized During Treatment: Rolling walker (2 wheels);Oxygen  OT Visit Diagnosis: Other  abnormalities of gait and mobility (R26.89);Muscle weakness (generalized) (M62.81)   Activity Tolerance Patient tolerated treatment well   Patient Left in chair;with call bell/phone within reach;with family/visitor present   Nurse Communication Mobility status        Time: 8596-8571 OT Time Calculation (min): 25 min  Charges: OT General Charges $OT Visit: 1 Visit OT Treatments $Self Care/Home Management : 23-37 mins  Jhonny Pelton, M.S., OTR/L 05/06/24, 2:58 PM

## 2024-05-06 NOTE — Plan of Care (Signed)

## 2024-05-06 NOTE — Consult Note (Signed)
 PHARMACY - ANTICOAGULATION CONSULT NOTE  Pharmacy Consult for warfarin re-initiation Indication: atrial fibrillation  Allergies  Allergen Reactions   Codeine Nausea And Vomiting   Nsaids     Dr instructed to avoid due to kidney issues    Ibuprofen Other (See Comments)    Dr instructed to avoid due to kidney issues    Prednisone Other (See Comments)    ELEVATED BLOOD PRESSURE.    Patient Measurements: Height: 5' (152.4 cm) Weight: 60.2 kg (132 lb 11.5 oz) IBW/kg (Calculated) : 45.5 HEPARIN  DW (KG): 50.1  Vital Signs: Temp: 97.5 F (36.4 C) (08/16 0823) Temp Source: Oral (08/16 0823) BP: 109/72 (08/16 0823) Pulse Rate: 87 (08/16 0823)  Labs: Recent Labs    05/03/24 1512 05/03/24 2235 05/04/24 0522 05/04/24 0922 05/05/24 0439 05/06/24 0602  HGB  --   --  14.0  --  13.9  --   HCT  --   --  43.5  --  44.3  --   PLT  --   --  179  --  181  --   LABPROT  --   --   --  17.2* 17.2* 19.0*  INR  --   --   --  1.3* 1.3* 1.5*  HEPARINUNFRC 0.43 0.30  --  0.42  --   --   CREATININE  --   --  0.90  --  0.93 0.91    Estimated Creatinine Clearance: 32.7 mL/min (by C-G formula based on SCr of 0.91 mg/dL).   Medical History: Past Medical History:  Diagnosis Date   (HFpEF) heart failure with preserved ejection fraction (HCC)    a. 07/2016 Echo: EF 51%; b. 12/2016 Echo: EF 41%. Mod TR, mild PR; c. 09/2017 Echo [facility]): EF 62%, Sev BAE. Mod TR, mild PR; d. 03/2019 Echo([facility]): EF 57%, Gr3 DD. Sev dil LA, mild dil RA. Midly dil Asc Ao. Trace AI. Mild to mod TR, mild PR. RVSP .   Anemia    Arthritis    Carotid arterial disease (HCC)    a. 10/2013 Mod to Sev RICA dzs. Mild dzs in L bulb; b. 10/2013 CTA Carotids: LICA 50p, RICA w/o stenosis.   CKD (chronic kidney disease), stage III (HCC)    Coronary artery disease    a. Several neg MV between 2008 and 2010; b. 2010 s/p PCI/DES to the mLAD; c. 10/2013 MV: Fixed apical defect, likely breast attenuation, no ischemia; d. 09/2014 MV: No  ischemia; e. 03/2018 MV [facility]): no ischemia.   GERD (gastroesophageal reflux disease)    HLD (hyperlipidemia)    Hypertension    Hypothyroidism    Permanent atrial fibrillation (HCC)    a. Dx ~ 2017. CHA2DS2VASc = 6-->coumadin .    Medications:  Scheduled:   amiodarone   400 mg Oral BID   Followed by   NOREEN ON 05/11/2024] amiodarone   200 mg Oral BID   Followed by   NOREEN ON 05/18/2024] amiodarone   200 mg Oral Daily   atorvastatin   40 mg Oral QHS   Chlorhexidine  Gluconate Cloth  6 each Topical Daily   digoxin   0.125 mg Oral Daily   enoxaparin  (LOVENOX ) injection  60 mg Subcutaneous Q12H   feeding supplement (GLUCERNA SHAKE)  237 mL Oral TID BM   fluticasone   1 spray Each Nare Daily   furosemide   40 mg Oral Daily   insulin  aspart  0-9 Units Subcutaneous TID WC   levothyroxine   88 mcg Oral Q0600   loratadine   5 mg Oral Daily  melatonin  10 mg Oral QHS   metoprolol  tartrate  50 mg Oral BID   pantoprazole   40 mg Oral Daily   sodium chloride  flush  3 mL Intravenous Q12H   Warfarin - Pharmacist Dosing Inpatient   Does not apply q1600   Infusions:    PRN: acetaminophen , ALPRAZolam , ondansetron  (ZOFRAN ) IV, senna, sodium chloride  flush  Assessment: 89 year old female who presented with NSTEMI and has chronic Afib in RVR. CHADVASC score of 6. Notable PMH hypothyroidism, CKD stage III, HFrEF, HTN. Currently receiving heparin  infusion for anticoagulation. Renal function at baseline with Scr of 0.9 mg/dL (CrCl 33 mL/min, GFR > 60 mL/min). CBC stable with hemoglobin of 14 g/dL and platelet count of 820 K/uL.   Patient takes warfarin 2 mg daily at home. Upon presentation initial INR was subtherapeutic at 1.7. Of note, amiodarone  was started with this admission which will increase INR. Other notable warfarin interactions with atorvastatin  (increased INR), and levothyroxine  (increased INR). Indicated for enoxaparin  bridge due to high stroke risk per CHADVASc score.   Goal of Therapy:  INR  2-3 Monitor platelets by anticoagulation protocol: Yes  Date:  INR: Dose: 8/14 1.2  3mg  8/15 1.3 3mg  8/16 1.5  Plan:  - Will order warfarin 2 mg PO x 1. Watch INR with new amiodarone  interaction.  - Daily INR checks with AM labs.  - Continue bridge with enoxaparin  60 mg subcutaneous BID until INR > 2.0 or 5 days of warfarin therapy completed  - Monitor CBC daily for hemoglobin and platelet count   Suzann Allean LABOR, PharmD Clinical Pharmacist 05/06/2024 9:56 AM

## 2024-05-06 NOTE — TOC Transition Note (Signed)
 Transition of Care [facility]) - Discharge Note   Patient Details  Name: Karen Cunningham MRN: [id number] Date of Birth: 05/28/1931  [facility] CM/SW Contact:  Marinda Cooks, RN Phone Number: 05/06/2024, 3:25 PM   Clinical Narrative:    This CM updated by covering MD pt medically cleared to dc today and has active DC order . This CM spoke with pt's son Youth worker) regarding dc plan with [facility] hospice and provided choice sin confirmed agency of choice is [facility] . This CM update weekend liaison with [facility] about referral . DME coordinated with [facility] also. This CM updated that [facility] will not be until [facility] 05/08/24 per [place] and that pt's family was made aware prior to pt's dc. DC transportation confirmed for pt with family per medical team to be safe .Medical team updated on all updates above. No additional DC needs requested by medical team or identified by CM at this time .     Final next level of care: Home w Hospice Care Barriers to Discharge: No Barriers Identified   Patient Goals and CMS Choice     Choice offered to / list presented to : Adult Children        Name of family member notified: Son Trude Cansler & Daughter in law Sharlet Patient and family notified of of transfer: 05/06/24  Date HH Agency Contacted: 05/06/24 Time HH Agency Contacted: 1025 Representative spoke with at [facility] Agency: Saddie Pinal  Social Drivers of Health (SDOH) Interventions SDOH Screenings   Food Insecurity: No Food Insecurity (05/01/2024)  Housing: Low Risk  (05/01/2024)  Transportation Needs: No Transportation Needs (05/01/2024)  Utilities: Not At Risk (05/01/2024)  Social Connections: Socially Isolated (05/01/2024)  Tobacco Use: Low Risk  (05/01/2024)     Readmission Risk Interventions     No data to display

## 2024-05-06 NOTE — Progress Notes (Signed)
 Pt's daughter in law Pam and her spouse given dc/rx instructions. Pt's daughter in law and spouse advised to contact hopsice dr or return to the ed as needed/worse. Pt's daughter in law voice understanding. Pt assisted getting dressed and wheeled out with glasses on. Daughter in law has packed pt's belongings and valuables that she had with her in room.

## 2024-05-06 NOTE — Plan of Care (Signed)

## 2024-05-06 NOTE — Progress Notes (Signed)
 [facility] LIAISON NOTE  Received request from Dr. Caleen and Bernarda Kitty NP/PMT of new referral for home hospice.  Notified Shanea Lining, Transitions of Care Manager, of referral received and that family indicated to medical team they would like [facility].  I met at the bedside with Ms. Peeks, her son, Oneil and her DIL, Pam to initiate education related to hospice philosophy, services, and team approach to care. Patient/family verbalized understanding of information given. Per discussion, the plan is for discharge home  private vehicle on later today.  DME needs discussed.   Patient has the following equipment in the home:  cane, 2 wheeled walker, [facility]   Patient/family requests the following equipment for delivery:   Hospice admission RN to assess.   The address has been verified and is correct in the chart.  Holley Emilio is the family contact to arrange time of equipment delivery.  Please send signed and completed DNR home with patient/family if applicable.   Please provide prescriptions at discharge as needed to ensure ongoing symptom management.   [facility] information and contact numbers given to patient, son and DIL.   Above information shared with Marinda Cooks, Transitions of Care Manager and hospital medical care team.  Please call with any hospice related questions or concerns.  Thank you for the opportunity to participate in this patient's care.  Saddie HILARIO Na, MA, BSN, RN, FNE Nurse Liaison [phone]

## 2024-05-08 ENCOUNTER — Telehealth: Payer: Self-pay | Admitting: Internal Medicine

## 2024-05-08 NOTE — Telephone Encounter (Signed)
 Kristen with [facility] left VM that the family is requesting Dr. Fernand to be the attending provider to sign off on patient's hospice orders. Please advise if you are in agreement with this.

## 2024-05-11 NOTE — Progress Notes (Deleted)
 Advanced Heart Failure Clinic Note   Referring Physician: PCP: Fernand Fredy RAMAN, MD PCP-Cardiologist: [name], MD   Chief Complaint:  HPI:       Review of Systems: [y] = yes, [ ]  = no   General: Weight gain [ ] ; Weight loss [ ] ; Anorexia [ ] ; Fatigue [ ] ; Fever [ ] ; Chills [ ] ; Weakness [ ]   Cardiac: Chest pain/pressure [ ] ; Resting SOB [ ] ; Exertional SOB [ ] ; Orthopnea [ ] ; Pedal Edema [ ] ; Palpitations [ ] ; Syncope [ ] ; Presyncope [ ] ; Paroxysmal nocturnal dyspnea[ ]   Pulmonary: Cough [ ] ; Wheezing[ ] ; Hemoptysis[ ] ; Sputum [ ] ; Snoring [ ]   GI: Vomiting[ ] ; Dysphagia[ ] ; Melena[ ] ; Hematochezia [ ] ; Heartburn[ ] ; Abdominal pain [ ] ; Constipation [ ] ; Diarrhea [ ] ; BRBPR [ ]   GU: Hematuria[ ] ; Dysuria [ ] ; Nocturia[ ]   Vascular: Pain in legs with walking [ ] ; Pain in feet with lying flat [ ] ; Non-healing sores [ ] ; Stroke [ ] ; TIA [ ] ; Slurred speech [ ] ;  Neuro: Headaches[ ] ; Vertigo[ ] ; Seizures[ ] ; Paresthesias[ ] ;Blurred vision [ ] ; Diplopia [ ] ; Vision changes [ ]   Ortho/Skin: Arthritis [ ] ; Joint pain [ ] ; Muscle pain [ ] ; Joint swelling [ ] ; Back Pain [ ] ; Rash [ ]   Psych: Depression[ ] ; Anxiety[ ]   Heme: Bleeding problems [ ] ; Clotting disorders [ ] ; Anemia [ ]   Endocrine: Diabetes [ ] ; Thyroid  dysfunction[ ]    Past Medical History:  Diagnosis Date   (HFpEF) heart failure with preserved ejection fraction (HCC)    a. 07/2016 Echo: EF 51%; b. 12/2016 Echo: EF 41%. Mod TR, mild PR; c. 09/2017 Echo [facility]): EF 62%, Sev BAE. Mod TR, mild PR; d. 03/2019 Echo([facility]): EF 57%, Gr3 DD. Sev dil LA, mild dil RA. Midly dil Asc Ao. Trace AI. Mild to mod TR, mild PR. RVSP .   Anemia    Arthritis    Carotid arterial disease (HCC)    a. 10/2013 Mod to Sev RICA dzs. Mild dzs in L bulb; b. 10/2013 CTA Carotids: LICA 50p, RICA w/o stenosis.   CKD (chronic kidney disease), stage III (HCC)    Coronary artery disease    a. Several neg MV between 2008 and 2010; b. 2010 s/p PCI/DES  to the mLAD; c. 10/2013 MV: Fixed apical defect, likely breast attenuation, no ischemia; d. 09/2014 MV: No ischemia; e. 03/2018 MV [facility]): no ischemia.   GERD (gastroesophageal reflux disease)    HLD (hyperlipidemia)    Hypertension    Hypothyroidism    Permanent atrial fibrillation (HCC)    a. Dx ~ 2017. CHA2DS2VASc = 6-->coumadin .    Current Outpatient Medications  Medication Sig Dispense Refill   acetaminophen  (TYLENOL ) 500 MG tablet Take 1 tablet (500 mg total) by mouth every 6 (six) hours as needed for moderate pain (pain). 30 tablet 0   ALPRAZolam  (XANAX ) 0.25 MG tablet Take 1 tablet (0.25 mg total) by mouth 3 (three) times daily as needed for anxiety. 30 tablet 0   atorvastatin  (LIPITOR) 40 MG tablet TAKE 1 TABLET BY MOUTH AT BEDTIME 90 tablet 2   B Complex Vitamins (VITAMIN B COMPLEX PO) Take by mouth.     Cholecalciferol  (VITAMIN D3) 1000 UNITS CAPS Take 1,000 Units by mouth daily.      digoxin  (LANOXIN ) 0.125 MG tablet Take 1 tablet (0.125 mg total) by mouth daily. 30 tablet 1   Ferrous Sulfate  (IRON) 325 (65 FE) MG TABS Take 325  mg by mouth daily.      fluticasone  (FLONASE ) 50 MCG/ACT nasal spray Place 1 spray into both nostrils daily. 11.1 mL 3   furosemide  (LASIX ) 40 MG tablet Take 1 tablet (40 mg total) by mouth daily. 30 tablet 1   levothyroxine  (SYNTHROID ) 88 MCG tablet Take 1 tablet (88 mcg total) by mouth every morning. 90 tablet 3   Loratadine  10 MG CAPS Take 1 capsule (10 mg total) by mouth daily. 90 capsule 3   Melatonin 10 MG TABS Take 10 mg by mouth at bedtime.     metoprolol  tartrate (LOPRESSOR ) 50 MG tablet Take 1 tablet (50 mg total) by mouth 2 (two) times daily. 180 tablet 3   omeprazole  (PRILOSEC) 20 MG capsule Take 1 capsule (20 mg total) by mouth daily. 90 capsule 3   senna (SENOKOT) 8.6 MG TABS tablet Take 1 tablet (8.6 mg total) by mouth daily as needed for mild constipation. 120 tablet 0   warfarin (COUMADIN ) 2 MG tablet TAKE 1 TABLET BY MOUTH IN THE EVENING OR  AS DIRECTED BY ANTICOAGULATION CLINIC 90 tablet 1   No current facility-administered medications for this visit.    Allergies  Allergen Reactions   Codeine Nausea And Vomiting   Nsaids     Dr instructed to avoid due to kidney issues    Ibuprofen Other (See Comments)    Dr instructed to avoid due to kidney issues    Prednisone Other (See Comments)    ELEVATED BLOOD PRESSURE.      Social History   Socioeconomic History   Marital status: Widowed    Spouse name: Not on file   Number of children: Not on file   Years of education: Not on file   Highest education level: Not on file  Occupational History   Not on file  Tobacco Use   Smoking status: Never   Smokeless tobacco: Never  Vaping Use   Vaping status: Never Used  Substance and Sexual Activity   Alcohol use: Never   Drug use: Never   Sexual activity: Not on file  Other Topics Concern   Not on file  Social History Narrative   Not on file   Social Drivers of Health   Financial Resource Strain: Not on file  Food Insecurity: No Food Insecurity (05/01/2024)   Hunger Vital Sign    Worried About Running Out of Food in the Last Year: Never true    Ran Out of Food in the Last Year: Never true  Transportation Needs: No Transportation Needs (05/01/2024)   PRAPARE - Administrator, Civil Service (Medical): No    Lack of Transportation (Non-Medical): No  Physical Activity: Not on file  Stress: Not on file  Social Connections: Socially Isolated (05/01/2024)   Social Connection and Isolation Panel    Frequency of Communication with Friends and Family: More than three times a week    Frequency of Social Gatherings with Friends and Family: More than three times a week    Attends Religious Services: Never    Database administrator or Organizations: No    Attends Banker Meetings: Never    Marital Status: Widowed  Intimate Partner Violence: Not At Risk (05/01/2024)   Humiliation, Afraid, Rape, and Kick  questionnaire    Fear of Current or Ex-Partner: No    Emotionally Abused: No    Physically Abused: No    Sexually Abused: No      Family History  Problem Relation  Age of Onset   Heart Problems Mother    Heart attack Mother    AAA (abdominal aortic aneurysm) Mother    Hypertension Mother    Heart Problems Father     There were no vitals filed for this visit.   PHYSICAL EXAM: General:  Well appearing. No respiratory difficulty HEENT: normal Neck: supple. no JVD. Carotids 2+ bilat; no bruits. No lymphadenopathy or thyromegaly appreciated. Cor: PMI nondisplaced. Regular rate & rhythm. No rubs, gallops or murmurs. Lungs: clear Abdomen: soft, nontender, nondistended. No hepatosplenomegaly. No bruits or masses. Good bowel sounds. Extremities: no cyanosis, clubbing, rash, edema Neuro: alert & oriented x 3, cranial nerves grossly intact. moves all 4 extremities w/o difficulty. Affect pleasant.  ECG:   ASSESSMENT & PLAN:     Ellouise DELENA Class, FNP 05/11/24

## 2024-05-12 ENCOUNTER — Encounter: Admitting: Family

## 2024-06-07 ENCOUNTER — Ambulatory Visit

## 2024-06-20 ENCOUNTER — Encounter: Payer: Self-pay | Admitting: Internal Medicine

## 2024-06-20 NOTE — Telephone Encounter (Signed)
 Please let Karen Cunningham and her daughter know that I am happy to continue prescribing digoxin  and furosemide .  However, I think it would be important for us  to check labs to ensure that her kidney function remained stable and that her digoxin  dose is not too high.  I recommend that she have a BMP and digoxin  level drawn at her earliest convenience.  [name], MD [facility]

## 2024-06-22 ENCOUNTER — Encounter: Payer: Self-pay | Admitting: Internal Medicine

## 2024-07-07 ENCOUNTER — Ambulatory Visit: Admitting: Podiatry

## 2024-07-11 ENCOUNTER — Ambulatory Visit: Admitting: Podiatry

## 2024-07-27 ENCOUNTER — Encounter: Payer: Self-pay | Admitting: Internal Medicine

## 2024-08-01 ENCOUNTER — Ambulatory Visit: Admitting: Podiatry

## 2024-08-01 ENCOUNTER — Encounter: Payer: Self-pay | Admitting: Podiatry

## 2024-08-01 VITALS — Ht 60.0 in | Wt 132.6 lb

## 2024-08-01 DIAGNOSIS — B351 Tinea unguium: Secondary | ICD-10-CM | POA: Diagnosis not present

## 2024-08-01 DIAGNOSIS — M79674 Pain in right toe(s): Secondary | ICD-10-CM | POA: Diagnosis not present

## 2024-08-01 DIAGNOSIS — M79675 Pain in left toe(s): Secondary | ICD-10-CM

## 2024-08-01 NOTE — Progress Notes (Signed)
   SUBJECTIVE Patient presents to office today complaining of elongated, thickened nails that cause pain while ambulating in shoes.  Patient is unable to trim their own nails. Patient is here for further evaluation and treatment.  Past Medical History:  Diagnosis Date   (HFpEF) heart failure with preserved ejection fraction (HCC)    a. 07/2016 Echo: EF 51%; b. 12/2016 Echo: EF 41%. Mod TR, mild PR; c. 09/2017 Echo [facility]): EF 62%, Sev BAE. Mod TR, mild PR; d. 03/2019 Echo([facility]): EF 57%, Gr3 DD. Sev dil LA, mild dil RA. Midly dil Asc Ao. Trace AI. Mild to mod TR, mild PR. RVSP .   Anemia    Arthritis    Carotid arterial disease    a. 10/2013 Mod to Sev RICA dzs. Mild dzs in L bulb; b. 10/2013 CTA Carotids: LICA 50p, RICA w/o stenosis.   CKD (chronic kidney disease), stage III (HCC)    Coronary artery disease    a. Several neg MV between 2008 and 2010; b. 2010 s/p PCI/DES to the mLAD; c. 10/2013 MV: Fixed apical defect, likely breast attenuation, no ischemia; d. 09/2014 MV: No ischemia; e. 03/2018 MV [facility]): no ischemia.   GERD (gastroesophageal reflux disease)    HLD (hyperlipidemia)    Hypertension    Hypothyroidism    Permanent atrial fibrillation (HCC)    a. Dx ~ 2017. CHA2DS2VASc = 6-->coumadin .    OBJECTIVE General Patient is awake, alert, and oriented x 3 and in no acute distress. Derm Skin is dry and supple bilateral. Negative open lesions or macerations. Remaining integument unremarkable. Nails are tender, long, thickened and dystrophic with subungual debris, consistent with onychomycosis, 1-5 bilateral. No signs of infection noted. Vasc  DP and PT pedal pulses palpable bilaterally. Temperature gradient within normal limits.  Neuro Epicritic and protective threshold sensation grossly intact bilaterally.  Musculoskeletal Exam No symptomatic pedal deformities noted bilateral. Muscular strength within normal limits.  ASSESSMENT 1.  Pain due to onychomycosis of toenails both  PLAN OF  CARE 1. Patient evaluated today.  2. Instructed to maintain good pedal hygiene and foot care.  3. Mechanical debridement of nails 1-5 bilaterally performed using a nail nipper. Filed with dremel without incident.  4. Return to clinic in 3 mos.   *goes by Miss Elida Thresa EMERSON Janit, DPM [facility]  Dr. Thresa EMERSON Janit, DPM    [address] [place], [place] [address]                Office [phone]  Fax [phone]

## 2024-08-02 ENCOUNTER — Ambulatory Visit

## 2024-08-02 ENCOUNTER — Encounter: Payer: Self-pay | Admitting: Internal Medicine

## 2024-08-02 ENCOUNTER — Ambulatory Visit: Attending: Internal Medicine | Admitting: Internal Medicine

## 2024-08-02 VITALS — BP 116/62 | HR 70 | Ht 60.0 in | Wt 119.0 lb

## 2024-08-02 DIAGNOSIS — I4891 Unspecified atrial fibrillation: Secondary | ICD-10-CM

## 2024-08-02 DIAGNOSIS — I5022 Chronic systolic (congestive) heart failure: Secondary | ICD-10-CM | POA: Diagnosis not present

## 2024-08-02 DIAGNOSIS — Z79899 Other long term (current) drug therapy: Secondary | ICD-10-CM | POA: Diagnosis present

## 2024-08-02 DIAGNOSIS — I4821 Permanent atrial fibrillation: Secondary | ICD-10-CM

## 2024-08-02 DIAGNOSIS — I350 Nonrheumatic aortic (valve) stenosis: Secondary | ICD-10-CM | POA: Diagnosis not present

## 2024-08-02 DIAGNOSIS — I2510 Atherosclerotic heart disease of native coronary artery without angina pectoris: Secondary | ICD-10-CM

## 2024-08-02 NOTE — Patient Instructions (Signed)
 Medication Instructions:  Your physician recommends the following medication changes.  STOP TAKING: Digoxin   *If you need a refill on your cardiac medications before your next appointment, please call your pharmacy*  Lab Work: Your provider would like for you to have following labs drawn today BMP.     Testing/Procedures: No test ordered today   Follow-Up: At [facility], you and your health needs are our priority.  As part of our continuing mission to provide you with exceptional heart care, our providers are all part of one team.  This team includes your primary Cardiologist (physician) and Advanced Practice Providers or APPs (Physician Assistants and Nurse Practitioners) who all work together to provide you with the care you need, when you need it.  Your next appointment:   6 week(s)  Provider:   You may see [name], MD or one of the following Advanced Practice Providers on your designated Care Team:   Lonni Meager, NP Lesley Maffucci, PA-C Bernardino Bring, PA-C Cadence Volo, PA-C Tylene Lunch, NP Barnie Hila, NP

## 2024-08-02 NOTE — Progress Notes (Signed)
 Cardiology Office Note:  .   Date:  08/03/2024  ID:  Karen Cunningham, DOB [birth year], MRN [id number] PCP: Fernand Fredy RAMAN, MD  [facility] Providers Cardiologist:  [name], MD     History of Present Illness: .   Karen Cunningham is a 89 y.o. female with history of coronary artery disease with PCI to the mid LAD in 2010, HFrEF, permanent atrial fibrillation, aortic valve stenosis, hypertension, hyperlipidemia, chronic kidney disease, hypothyroidism, and chronic lower extremity edema, who presents for follow-up of CAD, HFrEF, valvular heart disease, and atrial fibrillation.  I last saw her in the office in late July, at which time she was feeling fairly well with stable exertional dyspnea and bendopnea.  She was hospitalized in mid August with acute HFrEF and elevated (albeit downtrending) high-sensitivity troponin I suspicious for late presenting MI.  Her hospital course was complicated by atrial fibrillation with rapid ventricular response and hypotension.  Echocardiogram that admission demonstrated LVEF of 25-30% with hypokinesis to akinesis of the entire mid and apical myocardium.  Aortic valve stenosis was consistent with moderate to severe low-flow/low gradient AS.  Ultimately, the decision was made to continue with medical therapy and transition to hospice.  She was discharged on metoprolol , furosemide , and digoxin .  Since returning home, Karen Cunningham is actually doing fairly well.  Her biggest concern was development of frequent headaches in October.  She was ultimately diagnosed with COVID-19 around that time.  She continues to have headaches that she has been managing with acetaminophen  and oxycodone .  She wonders if some of her other medications could be contributing to this.  Her appetite has been fairly good.  She has lost a bit of weight, however, which she attributes to snacking less.  She remains on warfarin despite being in hospice (hospice has been monitoring her  INR's).  ROS: See HPI  Studies Reviewed: SABRA   EKG Interpretation Date/Time:  Wednesday August 02 2024 13:48:43 EST Ventricular Rate:  70 PR Interval:    QRS Duration:  94 QT Interval:  376 QTC Calculation: 406 R Axis:   65  Text Interpretation: Atrial fibrillation ST & T wave abnormality, consider inferolateral ischemia Abnormal ECG When compared with ECG of 01-May-2024 07:19, Vent. rate has decreased Inferolateral ST/T abnormalities are more pronounced Confirmed by Kandie Keiper [phone]) on 08/03/2024 11:35:59 AM    Risk Assessment/Calculations:    CHA2DS2-VASc Score = 6   This indicates a 9.7% annual risk of stroke. The patient's score is based upon: CHF History: 1 HTN History: 1 Diabetes History: 0 Stroke History: 0 Vascular Disease History: 1 Age Score: 2 Gender Score: 1            Physical Exam:   VS:  BP 116/62 (BP Location: Left Arm, Patient Position: Sitting, Cuff Size: Normal)   Pulse 70   Ht 5' (1.524 m)   Wt 119 lb (54 kg)   LMP  (LMP Unknown)   SpO2 98%   BMI 23.24 kg/m    Wt Readings from Last 3 Encounters:  08/02/24 119 lb (54 kg)  08/01/24 132 lb 9.1 oz (60.1 kg)  05/06/24 132 lb 11.5 oz (60.2 kg)    General:  NAD. Neck: No JVD or HJR. Lungs: Clear to auscultation bilaterally without wheezes or crackles. Heart: Irregularly irregular rhythm with 2/6 systolic murmur. Abdomen: Soft, nontender, nondistended. Extremities: No lower extremity edema.  ASSESSMENT AND PLAN: .    Chronic HFrEF: Karen Cunningham appears euvolemic on exam today and overall  is feeling somewhat better in regard to dyspnea and functional capacity compared to her hospitalization in August.  Given that she did not undergo coronary angiography at that time, it remains somewhat uncertain if her sudden drop in LVEF was related to late presenting MI (troponins were quite elevated but downtrending on admission) versus a stress-induced cardiomyopathy.  Given her advanced age and  comorbidities, I think it is best to defer coronary angiography and continue with medical therapy.  Will continue her current dose of metoprolol .  I am reluctant to add an ACE inhibitor/ARB given her low normal blood pressure today and concerns for low-flow/low gradient aortic stenosis on echocardiogram in August.  We will continue with furosemide  40 mg daily, as this seems to be managing her volume status well.  We discussed repeating an echocardiogram but have agreed to defer this now.  I will check a BMP to ensure her renal function and electrolytes are stable.  I think it would be reasonable to discontinue digoxin  at this time given that it could be contributing to her headaches.  Permanent atrial fibrillation: Ventricular rate control is quite good today.  Due to headaches and concerns about potential for toxicities associated with long-term digoxin  use, we will discontinue digoxin  today.  I have asked Karen Cunningham and her family to monitor her heart rates at home and to alert us  if they are consistently at or above 100 bpm at rest.  Continue warfarin with ongoing follow-up through hospice.  Coronary artery disease: No angina reported.  Suspect Karen Cunningham had late presenting MI leading to her acute HFrEF in August.  Continue warfarin and lieu of aspirin  as well as atorvastatin  for secondary prevention.  Aortic stenosis: Previously noted to be mild to moderate, though most recent echo in August was consistent with moderate to severe low-flow/low gradient aortic stenosis with mean gradient of 19 mmHg and a valve area of 0.6 cm.  We discussed repeating an echocardiogram to reassess this as well as her LVEF.  However, as she would not be a good candidate for intervention anyway and is feeling fairly well, we have agreed to defer this.    Dispo: Return to clinic in 6 weeks.  I think it would be reasonable for Karen Cunningham to continue with hospice care, as her long-term prognosis remains guarded in the setting  of her HFrEF and aortic stenosis.  Signed, [name], MD

## 2024-08-03 ENCOUNTER — Encounter: Payer: Self-pay | Admitting: Internal Medicine

## 2024-08-03 ENCOUNTER — Ambulatory Visit: Payer: Self-pay | Admitting: Internal Medicine

## 2024-08-03 DIAGNOSIS — I4821 Permanent atrial fibrillation: Secondary | ICD-10-CM | POA: Insufficient documentation

## 2024-08-03 LAB — BASIC METABOLIC PANEL WITH GFR
BUN/Creatinine Ratio: 18 (ref 12–28)
BUN: 17 mg/dL (ref 10–36)
CO2: 27 mmol/L (ref 20–29)
Calcium: 9 mg/dL (ref 8.7–10.3)
Chloride: 100 mmol/L (ref 96–106)
Creatinine, Ser: 0.95 mg/dL (ref 0.57–1.00)
Glucose: 134 mg/dL — ABNORMAL HIGH (ref 70–99)
Potassium: 3.4 mmol/L — ABNORMAL LOW (ref 3.5–5.2)
Sodium: 143 mmol/L (ref 134–144)
eGFR: 57 mL/min/1.73 — ABNORMAL LOW (ref >59)

## 2024-08-03 MED ORDER — POTASSIUM CHLORIDE CRYS ER 20 MEQ PO TBCR: 20.0000 meq | EXTENDED_RELEASE_TABLET | Freq: Every day | ORAL | 3 refills | Status: AC

## 2024-08-04 ENCOUNTER — Encounter: Payer: Self-pay | Admitting: Internal Medicine

## 2024-09-05 ENCOUNTER — Encounter: Payer: Self-pay | Admitting: Internal Medicine

## 2024-09-11 ENCOUNTER — Telehealth: Payer: Self-pay | Admitting: Emergency Medicine

## 2024-09-11 NOTE — Telephone Encounter (Signed)
 Called and spoke with daughter in law Pam, per DPR.  Pam states that Amaani could be feeling bad from medication adjustments made at the last office visit with Dr. Mady, or because she is coming down with something.  Offered her an office visit with Dr. Argentina on Tuesday, 09/12/24 at 1340.  Pam said she will call and speak to [facility] and will call back if she wants that office visit tomorrow or just wait to see Dr. Mady on 09/20/24 when there is an appointment already scheduled.  Pam expressed gratitude for the call and call back number for the office provided.  All other questions and concerns addressed at this time.

## 2024-09-12 NOTE — Telephone Encounter (Signed)
 I agree with recommendations outlined in prior notes.  Overall HR and blood pressures look okay and I am reluctant to make medication changes without being able to see Karen Cunningham in the office.  [name], MD [facility]

## 2024-09-14 NOTE — Progress Notes (Signed)
 " Cardiology Office Note:    Date:  09/15/2024   ID:  Karen Cunningham, DOB June 05, 1931, MRN [id number]  PCP:  Fernand Fredy RAMAN, MD   [facility] Providers Cardiologist:  [name], MD     Referring MD: Fernand Fredy RAMAN, MD   Chief complaint: Weakness, palpitations     History of Present Illness:   Karen Cunningham is a 89 y.o. female with a hx of coronary artery disease with PCI to the mid LAD in 2010, HFrEF, permanent atrial fibrillation, aortic valve stenosis, hypertension, hyperlipidemia, chronic kidney disease, hypothyroidism, and chronic lower extremity edema, who presents for follow-up of chronic cardiac conditions.   Hospitalized in August 2025 for acute HFrEF and troponin elevation, suspicious for late presenting MI.  Hospital course complicated by A-fib with RVR and hypotension. Echocardiogram that admission demonstrated LVEF of 25-30% with hypokinesis to akinesis of the entire mid and apical myocardium.  Aortic valve stenosis was consistent with moderate to severe low-flow/low gradient AS.  It remains uncertain as to whether the drop in EF was related to a late presenting MI versus a stress-induced cardiomyopathy, as LHC was not pursued.  Ultimately, the decision was made to continue with medical therapy and transition to hospice.  She was discharged on metoprolol , furosemide , and digoxin .   Most recently seen by Dr. Hanson with cardiology in November 2025, doing fairly well at that time without significant cardiac complaints.  The decision was made to forego any coronary angiography given advanced age and comorbidities, and to continue with medical therapy.  Patient had complained of increased headaches, digoxin  was discontinued as possible source of headaches and a potential for toxicity.  Family was instructed to continue to monitor heart rate at home, and alert the office if rates were consistently above 100 bpm at rest.  Warfarin managed through hospice.  Daughter in law,  Holley, contacted the office via mychart on 12/16 reporting increased DOE, activity intolerance with tachycardia on exertion, and headaches. The patient and Pam decided to restart digoxin , as the patient was mostly asymptomatic while on the medication in the hospital. She started this on 09/13/2024, and reports she's begun to feel better since taking it.   Patient presents with Pam, appears stable from a cardiovascular standpoint. Pam reports that after stopping the digoxin , patient became noticeably more fatigued, with tachycardic episodes that were increasing in frequency and intensity. Patient states she began to lack all energy, as every time she got up to exert herself it would exacerbate her symptoms. Asymptomatic at rest. The tachy-palpitations with activity were combined with SOB, both relieved with rest. Patient was reported to have had a URI when she was initially taken off the digoxin . And they were unsure whether her headache symptoms could've originated from that. They restarted her digoxin  on 12/24 (two days ago), and patient states she's felt significantly better since. Patient lives alone, hospice frequently visits, at baseline is able to perform all ADL's and up to light duty activities around the house. Has recently been too tired to do her normal activities. We did discuss the nature of her heart failure that's complicated by A. Fib and aortic stenosis, and what that will look like going forward with hospice.   ROS:   Please see the history of present illness.    All other systems reviewed and are negative.     Past Medical History:  Diagnosis Date   (HFpEF) heart failure with preserved ejection fraction (HCC)    a. 07/2016  Echo: EF 51%; b. 12/2016 Echo: EF 41%. Mod TR, mild PR; c. 09/2017 Echo [facility]): EF 62%, Sev BAE. Mod TR, mild PR; d. 03/2019 Echo([facility]): EF 57%, Gr3 DD. Sev dil LA, mild dil RA. Midly dil Asc Ao. Trace AI. Mild to mod TR, mild PR. RVSP .   Anemia    Arthritis     Carotid arterial disease    a. 10/2013 Mod to Sev RICA dzs. Mild dzs in L bulb; b. 10/2013 CTA Carotids: LICA 50p, RICA w/o stenosis.   CKD (chronic kidney disease), stage III (HCC)    Coronary artery disease    a. Several neg MV between 2008 and 2010; b. 2010 s/p PCI/DES to the mLAD; c. 10/2013 MV: Fixed apical defect, likely breast attenuation, no ischemia; d. 09/2014 MV: No ischemia; e. 03/2018 MV [facility]): no ischemia.   GERD (gastroesophageal reflux disease)    HLD (hyperlipidemia)    Hypertension    Hypothyroidism    Permanent atrial fibrillation (HCC)    a. Dx ~ 2017. CHA2DS2VASc = 6-->coumadin .    Past Surgical History:  Procedure Laterality Date   ABDOMINAL HYSTERECTOMY     APPENDECTOMY     BACK SURGERY     CARDIAC CATHETERIZATION     x1 stent;[facility]   CORONARY STENT PLACEMENT     JOINT REPLACEMENT Left    hip   JOINT REPLACEMENT Bilateral    knees   NISSEN FUNDOPLICATION     THORACOTOMY     TOTAL HIP ARTHROPLASTY Right 03/09/2018   Procedure: TOTAL HIP ARTHROPLASTY;  Surgeon: Mardee Lynwood SQUIBB, MD;  Location: [facility] ORS;  Service: Orthopedics;  Laterality: Right;    Current Medications: Active Medications[1]   Allergies:   Codeine, Nsaids, Ibuprofen, and Prednisone   Social History   Socioeconomic History   Marital status: Widowed    Spouse name: Not on file   Number of children: Not on file   Years of education: Not on file   Highest education level: Not on file  Occupational History   Not on file  Tobacco Use   Smoking status: Never   Smokeless tobacco: Never  Vaping Use   Vaping status: Never Used  Substance and Sexual Activity   Alcohol use: Never   Drug use: Never   Sexual activity: Not on file  Other Topics Concern   Not on file  Social History Narrative   Not on file   Social Drivers of Health   Tobacco Use: Low Risk (09/15/2024)   Patient History    Smoking Tobacco Use: Never    Smokeless Tobacco Use: Never    Passive Exposure: Not on file   Financial Resource Strain: Not on file  Food Insecurity: No Food Insecurity (05/01/2024)   Epic    Worried About Programme Researcher, Broadcasting/film/video in the Last Year: Never true    Ran Out of Food in the Last Year: Never true  Transportation Needs: No Transportation Needs (05/01/2024)   Epic    Lack of Transportation (Medical): No    Lack of Transportation (Non-Medical): No  Physical Activity: Not on file  Stress: Not on file  Social Connections: Socially Isolated (05/01/2024)   Social Connection and Isolation Panel    Frequency of Communication with Friends and Family: More than three times a week    Frequency of Social Gatherings with Friends and Family: More than three times a week    Attends Religious Services: Never    Database Administrator or Organizations: No  Attends Banker Meetings: Never    Marital Status: Widowed  Depression (PHQ2-9): Not on file  Alcohol Screen: Not on file  Housing: Low Risk (05/01/2024)   Epic    Unable to Pay for Housing in the Last Year: No    Number of Times Moved in the Last Year: 0    Homeless in the Last Year: No  Utilities: Not At Risk (05/01/2024)   Epic    Threatened with loss of utilities: No  Health Literacy: Not on file     Family History: The patient's family history includes AAA (abdominal aortic aneurysm) in her mother; Heart Problems in her father and mother; Heart attack in her mother; Hypertension in her mother.  EKGs/Labs/Other Studies Reviewed:    The following studies were reviewed today:       Recent Labs: 05/01/2024: B Natriuretic Peptide 955.8; Magnesium  2.0; TSH 3.030 05/02/2024: ALT 41 05/05/2024: Hemoglobin 13.9; Platelets 181 08/02/2024: BUN 17; Creatinine, Ser 0.95; Potassium 3.4; Sodium 143  Recent Lipid Panel    Component Value Date/Time   CHOL 140 04/03/2024 1340   TRIG 108 04/03/2024 1340   HDL 55 04/03/2024 1340   LDLCALC 65 04/03/2024 1340     Risk Assessment/Calculations:    CHA2DS2-VASc Score = 6    This indicates a 9.7% annual risk of stroke. The patient's score is based upon: CHF History: 1 HTN History: 1 Diabetes History: 0 Stroke History: 0 Vascular Disease History: 1 Age Score: 2 Gender Score: 1               Physical Exam:    VS:  BP 122/68   Pulse (!) 106   Ht 5' (1.524 m)   Wt 118 lb 9.6 oz (53.8 kg)   LMP  (LMP Unknown)   SpO2 98%   BMI 23.16 kg/m        Wt Readings from Last 3 Encounters:  09/15/24 118 lb 9.6 oz (53.8 kg)  08/02/24 119 lb (54 kg)  08/01/24 132 lb 9.1 oz (60.1 kg)     GEN: Well nourished, well developed in no acute distress HEENT: Normal NECK:  No carotid bruits CARDIAC: S1-S2 normal, irregular rate and rhythm, systolic murmur, no rubs, gallops RESPIRATORY:  Clear to auscultation without rales, wheezing or rhonchi  MUSCULOSKELETAL:  No edema; No deformity  SKIN: Warm and dry NEUROLOGIC:  Alert and oriented x 3 PSYCHIATRIC:  Normal affect       Assessment & Plan Chronic HFrEF (heart failure with reduced ejection fraction) (HCC) Echo 05/01/2024: LVEF 25-30%, LVSF severely decreased, RWMA present, RVSF moderately reduced Unknown wether newly reduced EF is 2/2 ischemia vs stress-induced cardiomyopathy Not believed to be a candidate for invasive procedures 2/2 age and comorbidities, proceeding with medical management and hospice care Digoxin  stopped at previous visit 08/02/2024 2/2 headaches. Patient caregiver restarted digoxin  on 09/13/2024 2/2 complaint of increased palpitations, will need a recheck of dig levels at follow up as it's too early to check today Patient is very adamant about staying on digoxin , and does not want to make anymore medication changes at this time. Digoxin  is not a great option d/t advanced age, although given history of symptomatic palpitations and tachycardia, may be a palliative option for rate control.  The majority of the visit was spent discussing patient symptoms and disease progression. Alternatives  to digoxin , such as amiodarone , were not discussed and could be considered at follow up visit next week with patient's primary cardiologist.  Unable to titrate  GDMT further given low BP Asymptomatic at rest Weight stable Appears euvolemic on exam Continue digoxin  0.125 mg daily until further discussion with primary cardiologist next week Continue lasix  40 mg daily Continue lopressor  50 mg BID Order BMP, CBC to rule out electrolyte abnormality or anemia. If K comes back depleted while on potassium supplementation would consider adding mag level.  Permanent atrial fibrillation (HCC) Reports HR range from 70s-160s Restarted her digoxin  2 days ago, reports less palpitations and more energy since restart Continue digoxin  0.125 mg as above Continue metoprolol  50 mg BID Continue warfarin as directed by Coumadin  clinic Nonrheumatic aortic valve stenosis Echo 05/01/2024: Mild-moderate cacification of AV, moderate thickening of AV, regurgitation not visualized, moderate-severe low-flow/low-gradient aortic stenosis No LE edema, syncope/near syncope, chest pain Not a candidate for surgical intervention, on hospice therapy with goal of symptom management. Coronary artery disease involving native coronary artery of native heart without angina pectoris Suspected late presenting MI l/t HFrEF earlier this year Not a candidate for invasive procedures 2/2 age and comorbidities Denies CP, near syncope, nausea, SOB at rest No ASA 2/2 warfarin Continue atorvastatin  40 mg daily  Disposition: Per patient request, keep follow up appointment next week with her favorite cardiologist, Dr. Mady.             Medication Adjustments/Labs and Tests Ordered: Current medicines are reviewed at length with the patient today.  Concerns regarding medicines are outlined above.  Orders Placed This Encounter  Procedures   Basic metabolic panel with GFR   TSH   EKG 12-Lead   No orders of the defined types were placed  in this encounter.   Patient Instructions  Medication Instructions:  Your physician recommends that you continue on your current medications as directed. Please refer to the Current Medication list given to you today.  *If you need a refill on your cardiac medications before your next appointment, please call your pharmacy*  Lab Work: Your provider would like for you to have following labs drawn today BMP and TSH.     Testing/Procedures: No test ordered today   Follow-Up: At [facility], you and your health needs are our priority.  As part of our continuing mission to provide you with exceptional heart care, our providers are all part of one team.  This team includes your primary Cardiologist (physician) and Advanced Practice Providers or APPs (Physician Assistants and Nurse Practitioners) who all work together to provide you with the care you need, when you need it.  Your next appointment:   09/20/2024  Provider:   Lonni Mady, MD         Signed, Laterria Lasota E Riyah Bardon, NP  09/15/2024 3:02 PM    [facility]     [1]  Current Meds  Medication Sig   acetaminophen  (TYLENOL ) 500 MG tablet Take 1 tablet (500 mg total) by mouth every 6 (six) hours as needed for moderate pain (pain).   ALPRAZolam  (XANAX ) 0.25 MG tablet Take 1 tablet (0.25 mg total) by mouth 3 (three) times daily as needed for anxiety.   atorvastatin  (LIPITOR) 40 MG tablet TAKE 1 TABLET BY MOUTH AT BEDTIME   Cholecalciferol  (VITAMIN D3) 1000 UNITS CAPS Take 1,000 Units by mouth daily.    Cyanocobalamin (B-12 PO) Take 1 tablet by mouth daily.   digoxin  (LANOXIN ) 0.125 MG tablet Take 0.125 mg by mouth daily.   Ferrous Sulfate  (IRON) 325 (65 FE) MG TABS Take 325 mg by mouth daily.    fluticasone  (FLONASE ) 50 MCG/ACT  nasal spray Place 1 spray into both nostrils daily.   furosemide  (LASIX ) 40 MG tablet Take 1 tablet (40 mg total) by mouth daily.   levothyroxine  (SYNTHROID ) 88 MCG tablet Take 1  tablet (88 mcg total) by mouth every morning.   Loratadine  10 MG CAPS Take 1 capsule (10 mg total) by mouth daily.   metoprolol  tartrate (LOPRESSOR ) 50 MG tablet Take 1 tablet (50 mg total) by mouth 2 (two) times daily.   nitroGLYCERIN (NITROSTAT) 0.4 MG SL tablet as needed.   omeprazole  (PRILOSEC) 20 MG capsule Take 1 capsule by mouth once daily   oxyCODONE -acetaminophen  (PERCOCET/ROXICET) 5-325 MG tablet Take 1 tablet by mouth every 6 (six) hours as needed.   potassium chloride  SA (KLOR-CON  M20) 20 MEQ tablet Take 1 tablet (20 mEq total) by mouth daily.   senna (SENOKOT) 8.6 MG TABS tablet Take 1 tablet (8.6 mg total) by mouth daily as needed for mild constipation.   warfarin (COUMADIN ) 2 MG tablet TAKE 1 TABLET BY MOUTH IN THE EVENING OR  AS  DIRECTED  BY  ANTICOAGULATION  CLINIC   "

## 2024-09-15 ENCOUNTER — Encounter: Payer: Self-pay | Admitting: Physician Assistant

## 2024-09-15 ENCOUNTER — Other Ambulatory Visit: Payer: Self-pay | Admitting: Internal Medicine

## 2024-09-15 ENCOUNTER — Ambulatory Visit: Attending: Physician Assistant | Admitting: Emergency Medicine

## 2024-09-15 VITALS — BP 122/68 | HR 106 | Ht 60.0 in | Wt 118.6 lb

## 2024-09-15 DIAGNOSIS — I350 Nonrheumatic aortic (valve) stenosis: Secondary | ICD-10-CM

## 2024-09-15 DIAGNOSIS — I2510 Atherosclerotic heart disease of native coronary artery without angina pectoris: Secondary | ICD-10-CM | POA: Diagnosis not present

## 2024-09-15 DIAGNOSIS — I4821 Permanent atrial fibrillation: Secondary | ICD-10-CM

## 2024-09-15 DIAGNOSIS — I5022 Chronic systolic (congestive) heart failure: Secondary | ICD-10-CM | POA: Diagnosis not present

## 2024-09-15 DIAGNOSIS — K219 Gastro-esophageal reflux disease without esophagitis: Secondary | ICD-10-CM

## 2024-09-15 NOTE — Assessment & Plan Note (Addendum)
 Echo 05/01/2024: Mild-moderate cacification of AV, moderate thickening of AV, regurgitation not visualized, moderate-severe low-flow/low-gradient aortic stenosis No LE edema, syncope/near syncope, chest pain Not a candidate for surgical intervention, on hospice therapy with goal of symptom management.

## 2024-09-15 NOTE — Assessment & Plan Note (Addendum)
 Echo 05/01/2024: LVEF 25-30%, LVSF severely decreased, RWMA present, RVSF moderately reduced Unknown wether newly reduced EF is 2/2 ischemia vs stress-induced cardiomyopathy Not believed to be a candidate for invasive procedures 2/2 age and comorbidities, proceeding with medical management and hospice care Digoxin  stopped at previous visit 08/02/2024 2/2 headaches. Patient caregiver restarted digoxin  on 09/13/2024 2/2 complaint of increased palpitations, will need a recheck of dig levels at follow up as it's too early to check today Patient is very adamant about staying on digoxin , and does not want to make anymore medication changes at this time. Digoxin  is not a great option d/t advanced age, although given history of symptomatic palpitations and tachycardia, may be a palliative option for rate control.  The majority of the visit was spent discussing patient symptoms and disease progression. Alternatives to digoxin , such as amiodarone , were not discussed and could be considered at follow up visit next week with patient's primary cardiologist.  Unable to titrate GDMT further given low BP Asymptomatic at rest Weight stable Appears euvolemic on exam Continue digoxin  0.125 mg daily until further discussion with primary cardiologist next week Continue lasix  40 mg daily Continue lopressor  50 mg BID Order BMP, CBC to rule out electrolyte abnormality or anemia. If K comes back depleted while on potassium supplementation would consider adding mag level.

## 2024-09-15 NOTE — Assessment & Plan Note (Addendum)
 Reports HR range from 70s-160s Restarted her digoxin  2 days ago, reports less palpitations and more energy since restart Continue digoxin  0.125 mg as above Continue metoprolol  50 mg BID Continue warfarin as directed by Coumadin  clinic

## 2024-09-15 NOTE — Patient Instructions (Signed)
 Medication Instructions:  Your physician recommends that you continue on your current medications as directed. Please refer to the Current Medication list given to you today.  *If you need a refill on your cardiac medications before your next appointment, please call your pharmacy*  Lab Work: Your provider would like for you to have following labs drawn today BMP and TSH.     Testing/Procedures: No test ordered today   Follow-Up: At [facility], you and your health needs are our priority.  As part of our continuing mission to provide you with exceptional heart care, our providers are all part of one team.  This team includes your primary Cardiologist (physician) and Advanced Practice Providers or APPs (Physician Assistants and Nurse Practitioners) who all work together to provide you with the care you need, when you need it.  Your next appointment:   09/20/2024  Provider:   [name], MD

## 2024-09-15 NOTE — Assessment & Plan Note (Addendum)
 Suspected late presenting MI l/t HFrEF earlier this year Not a candidate for invasive procedures 2/2 age and comorbidities Denies CP, near syncope, nausea, SOB at rest No ASA 2/2 warfarin Continue atorvastatin  40 mg daily

## 2024-09-16 LAB — TSH: TSH: 2.27 u[IU]/mL (ref 0.450–4.500)

## 2024-09-16 LAB — BASIC METABOLIC PANEL WITH GFR
BUN/Creatinine Ratio: 15 (ref 12–28)
BUN: 15 mg/dL (ref 10–36)
CO2: 23 mmol/L (ref 20–29)
Calcium: 9.5 mg/dL (ref 8.7–10.3)
Chloride: 102 mmol/L (ref 96–106)
Creatinine, Ser: 0.97 mg/dL (ref 0.57–1.00)
Glucose: 97 mg/dL (ref 70–99)
Potassium: 4.1 mmol/L (ref 3.5–5.2)
Sodium: 141 mmol/L (ref 134–144)
eGFR: 55 mL/min/1.73 — ABNORMAL LOW

## 2024-09-17 ENCOUNTER — Ambulatory Visit: Payer: Self-pay | Admitting: Emergency Medicine

## 2024-09-18 NOTE — Progress Notes (Unsigned)
" °  Cardiology Office Note:  .   Date:  09/20/2024  ID:  Karen Cunningham, DOB Aug 15, 1931, MRN [id number] PCP: Fernand Fredy RAMAN, MD  [facility] Providers Cardiologist:  [name], MD { Click to update primary MD,subspecialty MD or APP then REFRESH:1}    History of Present Illness: .   Karen Cunningham is a 89 y.o. female with history of coronary artery disease with PCI to the mid LAD in 2010, HFrEF, permanent atrial fibrillation, aortic valve stenosis, hypertension, hyperlipidemia, chronic kidney disease, hypothyroidism, and chronic lower extremity edema, who presents for follow-up of CAD, atrial fibrillation, aortic stenosis, and HFrEF.  I last saw her in mid November, which time his pain was actually feeling fairly well following her hospitalization and July with presumed late presenting STEMI complicated by acute HFrEF and atrial fibrillation with rapid ventricular response.  She was ultimately discharged with home home hospice.  At our follow-up visit, we agreed to stop digoxin  given concerns for potential toxicity, including persistent headaches.  Her family reached out to us  several times earlier this month with concerns about elevated heart rates and dyspnea.  Karen Cunningham was therefore seen last week by Miriam Shams, NP, at which time decision was made to resume digoxin  at the patient's request.  Restarted digoxin  1 week ago.  Less palpitations and hearing heart pounding.  Breathing better.  No chest pain.  Mild edema per hospice nurse.  Sinus congestion improving with resumption of Flonase .  No orthopnea.  ROS: See HPI  Studies Reviewed: .        *** Risk Assessment/Calculations:   {Does this patient have ATRIAL FIBRILLATION?:[phone]}         Physical Exam:   VS:  BP 104/80 (BP Location: Left Arm, Patient Position: Sitting, Cuff Size: Normal)   Pulse 83   Ht 5' (1.524 m)   Wt 118 lb (53.5 kg)   LMP  (LMP Unknown)   SpO2 98%   BMI 23.05 kg/m    Wt Readings from  Last 3 Encounters:  09/20/24 118 lb (53.5 kg)  09/15/24 118 lb 9.6 oz (53.8 kg)  08/02/24 119 lb (54 kg)    General:  NAD. Neck: No JVD or HJR. Lungs: Mildly diminished breath sounds with crackles in left lower lung field. Heart: Irregularly irregular rhythm with 3/6 systolic murmur. Abdomen: Soft, nontender, nondistended. Extremities: No lower extremity edema.  ASSESSMENT AND PLAN: .    ***    {Are you ordering a CV Procedure (e.g. stress test, cath, DCCV, TEE, etc)?   Press F2        :[phone]}  Dispo: ***  Signed, [name], MD  "

## 2024-09-20 ENCOUNTER — Ambulatory Visit
Admission: RE | Admit: 2024-09-20 | Discharge: 2024-09-20 | Disposition: A | Discharge status: Home / Self Care | Source: Ambulatory Visit | Attending: Internal Medicine | Admitting: Internal Medicine

## 2024-09-20 ENCOUNTER — Other Ambulatory Visit
Admission: RE | Admit: 2024-09-20 | Discharge: 2024-09-20 | Disposition: A | Discharge status: Home / Self Care | Source: Ambulatory Visit | Attending: Internal Medicine | Admitting: Internal Medicine

## 2024-09-20 ENCOUNTER — Encounter: Payer: Self-pay | Admitting: Internal Medicine

## 2024-09-20 ENCOUNTER — Ambulatory Visit: Payer: Self-pay | Admitting: Internal Medicine

## 2024-09-20 ENCOUNTER — Telehealth: Payer: Self-pay | Admitting: Internal Medicine

## 2024-09-20 ENCOUNTER — Ambulatory Visit: Attending: Internal Medicine | Admitting: Internal Medicine

## 2024-09-20 VITALS — BP 104/80 | HR 83 | Ht 60.0 in | Wt 118.0 lb

## 2024-09-20 DIAGNOSIS — I2510 Atherosclerotic heart disease of native coronary artery without angina pectoris: Secondary | ICD-10-CM | POA: Diagnosis not present

## 2024-09-20 DIAGNOSIS — I350 Nonrheumatic aortic (valve) stenosis: Secondary | ICD-10-CM

## 2024-09-20 DIAGNOSIS — I4821 Permanent atrial fibrillation: Secondary | ICD-10-CM | POA: Diagnosis not present

## 2024-09-20 DIAGNOSIS — I5022 Chronic systolic (congestive) heart failure: Secondary | ICD-10-CM

## 2024-09-20 LAB — PROTIME-INR
INR: 2.4 — ABNORMAL HIGH (ref 0.8–1.2)
Prothrombin Time: 27.2 s — ABNORMAL HIGH (ref 11.4–15.2)

## 2024-09-20 LAB — DIGOXIN LEVEL: Digoxin Level: 1.2 ng/mL (ref 0.8–2.0)

## 2024-09-20 NOTE — Patient Instructions (Addendum)
 Medication Instructions:  No changes *If you need a refill on your cardiac medications before your next appointment, please call your pharmacy*  Lab Work: Your provider would like for you to have the following labs today: Digoxin  level and INR  If you have labs (blood work) drawn today and your tests are completely normal, you will receive your results only by: MyChart Message (if you have MyChart) OR A paper copy in the mail If you have any lab test that is abnormal or we need to change your treatment, we will call you to review the results.  Testing/Procedures: Your provider has ordered a chest X-Ray for you. You can have this done at the [facility] medical mall. You do not need an appointment. Please go to the entrance of the Medical Mall and check in at the front desk.   Your physician has requested that you have an echocardiogram. Echocardiography is a painless test that uses sound waves to create images of your heart. It provides your doctor with information about the size and shape of your heart and how well your hearts chambers and valves are working.   You may receive an ultrasound enhancing agent through an IV if needed to better visualize your heart during the echo. This procedure takes approximately one hour.  There are no restrictions for this procedure.  This will take place at [address] [facility] [address], [place] [address]  Please note: We ask at that you not bring children with you during ultrasound (echo/ vascular) testing. Due to room size and safety concerns, children are not allowed in the ultrasound rooms during exams. Our front office staff cannot provide observation of children in our lobby area while testing is being conducted. An adult accompanying a patient to their appointment will only be allowed in the ultrasound room at the discretion of the ultrasound technician under special circumstances. We apologize for any inconvenience.   Follow-Up: At  [facility], you and your health needs are our priority.  As part of our continuing mission to provide you with exceptional heart care, our providers are all part of one team.  This team includes your primary Cardiologist (physician) and Advanced Practice Providers or APPs (Physician Assistants and Nurse Practitioners) who all work together to provide you with the care you need, when you need it.  Your next appointment:   1 month(s)  Provider:   You may see [name], MD or one of the following Advanced Practice Providers on your designated Care Team:   Lonni Meager, NP Lesley Maffucci, PA-C Bernardino Bring, PA-C Cadence Cameron, PA-C Tylene Lunch, NP Barnie Hila, NP    We recommend signing up for the patient portal called MyChart.  Sign up information is provided on this After Visit Summary.  MyChart is used to connect with patients for Virtual Visits (Telemedicine).  Patients are able to view lab/test results, encounter notes, upcoming appointments, etc.  Non-urgent messages can be sent to your provider as well.   To learn more about what you can do with MyChart, go to [email or web address].

## 2024-09-20 NOTE — Telephone Encounter (Signed)
 Spoke to the patient's daughter, per the DPR. She has read the MyChart message about her results and has no further questions.

## 2024-09-20 NOTE — Telephone Encounter (Signed)
 Pts daughter would like a call back regarding pts INR being 2.4.  She would like to know if patients coumadin  needs to be adjusted, please advise

## 2024-09-21 ENCOUNTER — Encounter: Payer: Self-pay | Admitting: Internal Medicine

## 2024-10-05 ENCOUNTER — Encounter: Payer: Self-pay | Admitting: Internal Medicine

## 2024-10-05 ENCOUNTER — Ambulatory Visit: Payer: PRIVATE HEALTH INSURANCE | Admitting: Internal Medicine

## 2024-10-05 VITALS — BP 140/88 | HR 92 | Ht 60.0 in | Wt 118.0 lb

## 2024-10-05 DIAGNOSIS — E039 Hypothyroidism, unspecified: Secondary | ICD-10-CM | POA: Diagnosis not present

## 2024-10-05 DIAGNOSIS — I2510 Atherosclerotic heart disease of native coronary artery without angina pectoris: Secondary | ICD-10-CM

## 2024-10-05 DIAGNOSIS — I4821 Permanent atrial fibrillation: Secondary | ICD-10-CM

## 2024-10-05 DIAGNOSIS — I10 Essential (primary) hypertension: Secondary | ICD-10-CM

## 2024-10-05 DIAGNOSIS — R7309 Other abnormal glucose: Secondary | ICD-10-CM

## 2024-10-05 DIAGNOSIS — I5022 Chronic systolic (congestive) heart failure: Secondary | ICD-10-CM | POA: Diagnosis not present

## 2024-10-05 DIAGNOSIS — E782 Mixed hyperlipidemia: Secondary | ICD-10-CM

## 2024-10-05 NOTE — Progress Notes (Signed)
 "  Established Patient Office Visit  Subjective:  Patient ID: Karen Cunningham, female    DOB: [birth year]  Age: 89 y.o. MRN: [id number]  Chief Complaint  Patient presents with   Follow-up    6 month follow up    Patient comes in for her 6 follow up accompanied by a family member. Patient was hospitalized in August 2025 , Afib with rapid RVR, Acute on chronic CHF , and decline in EF. Her meds were adjusted and she was discharged on po Digoxin . Her levels are being monitored by her Cardiologist. Later on she had Covid infection - home tested. Today she feels much better. She is in sinus rhythm, no shortness of breath and no palpitations. Denies nausea, vomiting or diarrhea.    No other concerns at this time.   Past Medical History:  Diagnosis Date   (HFpEF) heart failure with preserved ejection fraction (HCC)    a. 07/2016 Echo: EF 51%; b. 12/2016 Echo: EF 41%. Mod TR, mild PR; c. 09/2017 Echo [facility]): EF 62%, Sev BAE. Mod TR, mild PR; d. 03/2019 Echo([facility]): EF 57%, Gr3 DD. Sev dil LA, mild dil RA. Midly dil Asc Ao. Trace AI. Mild to mod TR, mild PR. RVSP .   Anemia    Arthritis    Carotid arterial disease    a. 10/2013 Mod to Sev RICA dzs. Mild dzs in L bulb; b. 10/2013 CTA Carotids: LICA 50p, RICA w/o stenosis.   CKD (chronic kidney disease), stage III (HCC)    Coronary artery disease    a. Several neg MV between 2008 and 2010; b. 2010 s/p PCI/DES to the mLAD; c. 10/2013 MV: Fixed apical defect, likely breast attenuation, no ischemia; d. 09/2014 MV: No ischemia; e. 03/2018 MV [facility]): no ischemia.   GERD (gastroesophageal reflux disease)    HLD (hyperlipidemia)    Hypertension    Hypothyroidism    Permanent atrial fibrillation (HCC)    a. Dx ~ 2017. CHA2DS2VASc = 6-->coumadin .    Past Surgical History:  Procedure Laterality Date   ABDOMINAL HYSTERECTOMY     APPENDECTOMY     BACK SURGERY     CARDIAC CATHETERIZATION     x1 stent;[facility]   CORONARY STENT PLACEMENT     JOINT  REPLACEMENT Left    hip   JOINT REPLACEMENT Bilateral    knees   NISSEN FUNDOPLICATION     THORACOTOMY     TOTAL HIP ARTHROPLASTY Right 03/09/2018   Procedure: TOTAL HIP ARTHROPLASTY;  Surgeon: Mardee Lynwood SQUIBB, MD;  Location: [facility] ORS;  Service: Orthopedics;  Laterality: Right;    Social History   Socioeconomic History   Marital status: Widowed    Spouse name: Not on file   Number of children: Not on file   Years of education: Not on file   Highest education level: Not on file  Occupational History   Not on file  Tobacco Use   Smoking status: Never   Smokeless tobacco: Never  Vaping Use   Vaping status: Never Used  Substance and Sexual Activity   Alcohol use: Never   Drug use: Never   Sexual activity: Not on file  Other Topics Concern   Not on file  Social History Narrative   Not on file   Social Drivers of Health   Tobacco Use: Low Risk (10/05/2024)   Patient History    Smoking Tobacco Use: Never    Smokeless Tobacco Use: Never    Passive Exposure: Not on file  Financial  Resource Strain: Not on file  Food Insecurity: No Food Insecurity (05/01/2024)   Epic    Worried About Programme Researcher, Broadcasting/film/video in the Last Year: Never true    Ran Out of Food in the Last Year: Never true  Transportation Needs: No Transportation Needs (05/01/2024)   Epic    Lack of Transportation (Medical): No    Lack of Transportation (Non-Medical): No  Physical Activity: Not on file  Stress: Not on file  Social Connections: Socially Isolated (05/01/2024)   Social Connection and Isolation Panel    Frequency of Communication with Friends and Family: More than three times a week    Frequency of Social Gatherings with Friends and Family: More than three times a week    Attends Religious Services: Never    Database Administrator or Organizations: No    Attends Banker Meetings: Never    Marital Status: Widowed  Intimate Partner Violence: Not At Risk (05/01/2024)   Epic    Fear of Current  or Ex-Partner: No    Emotionally Abused: No    Physically Abused: No    Sexually Abused: No  Depression (PHQ2-9): Not on file  Alcohol Screen: Not on file  Housing: Low Risk (05/01/2024)   Epic    Unable to Pay for Housing in the Last Year: No    Number of Times Moved in the Last Year: 0    Homeless in the Last Year: No  Utilities: Not At Risk (05/01/2024)   Epic    Threatened with loss of utilities: No  Health Literacy: Not on file    Family History  Problem Relation Age of Onset   Heart Problems Mother    Heart attack Mother    AAA (abdominal aortic aneurysm) Mother    Hypertension Mother    Heart Problems Father     Allergies[1]  Show/hide medication list[2]  Review of Systems  Constitutional: Negative.  Negative for chills, fever and malaise/fatigue.  HENT: Negative.  Negative for congestion and sore throat.   Eyes: Negative.  Negative for blurred vision and pain.  Respiratory: Negative.  Negative for cough and shortness of breath.   Cardiovascular: Negative.  Negative for chest pain, palpitations and leg swelling.  Gastrointestinal: Negative.  Negative for abdominal pain, blood in stool, constipation, diarrhea, heartburn, melena, nausea and vomiting.  Genitourinary: Negative.  Negative for dysuria, flank pain, frequency and urgency.  Musculoskeletal: Negative.  Negative for joint pain and myalgias.  Skin: Negative.   Neurological: Negative.  Negative for dizziness, tingling, sensory change, weakness and headaches.  Endo/Heme/Allergies: Negative.   Psychiatric/Behavioral: Negative.  Negative for depression and suicidal ideas. The patient is not nervous/anxious.        Objective:   BP (!) 140/88   Pulse 92   Ht 5' (1.524 m)   Wt 118 lb (53.5 kg)   LMP  (LMP Unknown)   SpO2 98%   BMI 23.05 kg/m   Vitals:   10/05/24 1103  BP: (!) 140/88  Pulse: 92  Height: 5' (1.524 m)  Weight: 118 lb (53.5 kg)  SpO2: 98%  BMI (Calculated): 23.05    Physical  Exam Vitals and nursing note reviewed.  Constitutional:      Appearance: Normal appearance.  HENT:     Head: Normocephalic and atraumatic.     Nose: Nose normal.     Mouth/Throat:     Mouth: Mucous membranes are moist.     Pharynx: Oropharynx is clear.  Eyes:  Conjunctiva/sclera: Conjunctivae normal.     Pupils: Pupils are equal, round, and reactive to light.  Cardiovascular:     Rate and Rhythm: Normal rate and regular rhythm.     Pulses: Normal pulses.     Heart sounds: Normal heart sounds. No murmur heard. Pulmonary:     Effort: Pulmonary effort is normal.     Breath sounds: Normal breath sounds. No wheezing.  Abdominal:     General: Bowel sounds are normal.     Palpations: Abdomen is soft.     Tenderness: There is no abdominal tenderness. There is no right CVA tenderness or left CVA tenderness.  Musculoskeletal:        General: Normal range of motion.     Cervical back: Normal range of motion.     Right lower leg: No edema.     Left lower leg: No edema.  Skin:    General: Skin is warm and dry.  Neurological:     General: No focal deficit present.     Mental Status: She is alert and oriented to person, place, and time.  Psychiatric:        Mood and Affect: Mood normal.        Behavior: Behavior normal.      No results found for any visits on 10/05/24.  Recent Results (from the past 2160 hours)  Basic metabolic panel with GFR     Status: Abnormal   Collection Time: 08/02/24  2:48 PM  Result Value Ref Range   Glucose 134 (H) 70 - 99 mg/dL   BUN 17 10 - 36 mg/dL   Creatinine, Ser 9.04 0.57 - 1.00 mg/dL   eGFR 57 (L) >40 fO/fpw/8.26   BUN/Creatinine Ratio 18 12 - 28   Sodium 143 134 - 144 mmol/L   Potassium 3.4 (L) 3.5 - 5.2 mmol/L   Chloride 100 96 - 106 mmol/L   CO2 27 20 - 29 mmol/L   Calcium  9.0 8.7 - 10.3 mg/dL  Basic metabolic panel with GFR     Status: Abnormal   Collection Time: 09/15/24  2:46 PM  Result Value Ref Range   Glucose 97 70 - 99 mg/dL    BUN 15 10 - 36 mg/dL   Creatinine, Ser 9.02 0.57 - 1.00 mg/dL   eGFR 55 (L) >40 fO/fpw/8.26   BUN/Creatinine Ratio 15 12 - 28   Sodium 141 134 - 144 mmol/L   Potassium 4.1 3.5 - 5.2 mmol/L   Chloride 102 96 - 106 mmol/L   CO2 23 20 - 29 mmol/L   Calcium  9.5 8.7 - 10.3 mg/dL  TSH     Status: None   Collection Time: 09/15/24  2:46 PM  Result Value Ref Range   TSH 2.270 0.450 - 4.500 uIU/mL  Protime-INR     Status: Abnormal   Collection Time: 09/20/24 12:43 PM  Result Value Ref Range   Prothrombin Time 27.2 (H) 11.4 - 15.2 seconds   INR 2.4 (H) 0.8 - 1.2    Comment: (NOTE) INR goal varies based on device and disease states. Performed at [facility], [address] [place], [place] [address]   Digoxin  level     Status: None   Collection Time: 09/20/24 12:43 PM  Result Value Ref Range   Digoxin  Level 1.2 0.8 - 2.0 ng/mL    Comment: Performed at [facility], [address], [place], [place] [address]      Assessment & Plan:  Continue current meds. Check  labs. Problem List Items Addressed This Visit       Cardiovascular and Mediastinum   Coronary artery disease involving native coronary artery of native heart without angina pectoris   Relevant Orders   CBC with Diff   Permanent atrial fibrillation [facility])     Endocrine   Acquired hypothyroidism   Relevant Orders   TSH+T4F+T3Free     Other   Mixed hyperlipidemia   Relevant Orders   Lipid Panel w/o Chol/HDL Ratio   Other Visit Diagnoses       Essential hypertension, benign    -  Primary   Relevant Orders   CMP14+EGFR     Elevated glucose       Relevant Orders   Hemoglobin A1c       Return in about 6 months (around 04/04/2025).   Total time spent: 30 minutes. This time includes review of previous notes and results and patient face to face interaction during today's visit.    FERNAND FREDY RAMAN, MD  10/05/2024   This document may have been prepared by [facility] Voice Recognition software  and as such may include unintentional dictation errors.      [1]  Allergies Allergen Reactions   Codeine Nausea And Vomiting   Nsaids     Dr instructed to avoid due to kidney issues    Ibuprofen Other (See Comments)    Dr instructed to avoid due to kidney issues    Prednisone Other (See Comments)    ELEVATED BLOOD PRESSURE.  [2]  Outpatient Medications Prior to Visit  Medication Sig   acetaminophen  (TYLENOL ) 500 MG tablet Take 1 tablet (500 mg total) by mouth every 6 (six) hours as needed for moderate pain (pain).   ALPRAZolam  (XANAX ) 0.25 MG tablet Take 1 tablet (0.25 mg total) by mouth 3 (three) times daily as needed for anxiety.   atorvastatin  (LIPITOR) 40 MG tablet TAKE 1 TABLET BY MOUTH AT BEDTIME   Cholecalciferol  (VITAMIN D3) 1000 UNITS CAPS Take 1,000 Units by mouth daily.    Cyanocobalamin (B-12 PO) Take 1 tablet by mouth daily.   digoxin  (LANOXIN ) 0.125 MG tablet Take 0.125 mg by mouth daily.   Ferrous Sulfate  (IRON) 325 (65 FE) MG TABS Take 325 mg by mouth daily.    fluticasone  (FLONASE ) 50 MCG/ACT nasal spray Place 1 spray into both nostrils daily.   furosemide  (LASIX ) 40 MG tablet Take 1 tablet (40 mg total) by mouth daily.   levothyroxine  (SYNTHROID ) 88 MCG tablet Take 1 tablet (88 mcg total) by mouth every morning.   Loratadine  10 MG CAPS Take 1 capsule (10 mg total) by mouth daily.   metoprolol  tartrate (LOPRESSOR ) 50 MG tablet Take 1 tablet (50 mg total) by mouth 2 (two) times daily.   nitroGLYCERIN (NITROSTAT) 0.4 MG SL tablet as needed.   omeprazole  (PRILOSEC) 20 MG capsule Take 1 capsule by mouth once daily   oxyCODONE -acetaminophen  (PERCOCET/ROXICET) 5-325 MG tablet Take 1 tablet by mouth every 6 (six) hours as needed.   potassium chloride  SA (KLOR-CON  M20) 20 MEQ tablet Take 1 tablet (20 mEq total) by mouth daily.   senna (SENOKOT) 8.6 MG TABS tablet Take 1 tablet (8.6 mg total) by mouth daily as needed for mild constipation.   warfarin (COUMADIN ) 2 MG tablet  TAKE 1 TABLET BY MOUTH IN THE EVENING OR  AS  DIRECTED  BY  ANTICOAGULATION  CLINIC   No facility-administered medications prior to visit.   "

## 2024-10-06 ENCOUNTER — Ambulatory Visit: Payer: Self-pay | Admitting: Internal Medicine

## 2024-10-06 LAB — CBC WITH DIFFERENTIAL/PLATELET
Basophils Absolute: 0.1 x10E3/uL (ref 0.0–0.2)
Basos: 1 %
EOS (ABSOLUTE): 0.1 x10E3/uL (ref 0.0–0.4)
Eos: 2 %
Hematocrit: 41.5 % (ref 34.0–46.6)
Hemoglobin: 12.8 g/dL (ref 11.1–15.9)
Immature Grans (Abs): 0 x10E3/uL (ref 0.0–0.1)
Immature Granulocytes: 0 %
Lymphocytes Absolute: 1.4 x10E3/uL (ref 0.7–3.1)
Lymphs: 17 %
MCH: 28 pg (ref 26.6–33.0)
MCHC: 30.8 g/dL — ABNORMAL LOW (ref 31.5–35.7)
MCV: 91 fL (ref 79–97)
Monocytes Absolute: 0.7 x10E3/uL (ref 0.1–0.9)
Monocytes: 8 %
Neutrophils Absolute: 5.9 x10E3/uL (ref 1.4–7.0)
Neutrophils: 72 %
Platelets: 248 x10E3/uL (ref 150–450)
RBC: 4.57 x10E6/uL (ref 3.77–5.28)
RDW: 13.7 % (ref 11.7–15.4)
WBC: 8.2 x10E3/uL (ref 3.4–10.8)

## 2024-10-06 LAB — TSH+T4F+T3FREE
Free T4: 1.32 ng/dL (ref 0.82–1.77)
T3, Free: 2.8 pg/mL (ref 2.0–4.4)
TSH: 3.06 u[IU]/mL (ref 0.450–4.500)

## 2024-10-06 LAB — CMP14+EGFR
ALT: 19 IU/L (ref 0–32)
AST: 33 IU/L (ref 0–40)
Albumin: 3.9 g/dL (ref 3.6–4.6)
Alkaline Phosphatase: 134 IU/L — ABNORMAL HIGH (ref 48–129)
BUN/Creatinine Ratio: 16 (ref 12–28)
BUN: 14 mg/dL (ref 10–36)
Bilirubin Total: 0.8 mg/dL (ref 0.0–1.2)
CO2: 27 mmol/L (ref 20–29)
Calcium: 9.5 mg/dL (ref 8.7–10.3)
Chloride: 102 mmol/L (ref 96–106)
Creatinine, Ser: 0.87 mg/dL (ref 0.57–1.00)
Globulin, Total: 3 g/dL (ref 1.5–4.5)
Glucose: 92 mg/dL (ref 70–99)
Potassium: 4.5 mmol/L (ref 3.5–5.2)
Sodium: 142 mmol/L (ref 134–144)
Total Protein: 6.9 g/dL (ref 6.0–8.5)
eGFR: 63 mL/min/1.73

## 2024-10-06 LAB — LIPID PANEL W/O CHOL/HDL RATIO
Cholesterol, Total: 137 mg/dL (ref 100–199)
HDL: 53 mg/dL
LDL Chol Calc (NIH): 63 mg/dL (ref 0–99)
Triglycerides: 118 mg/dL (ref 0–149)
VLDL Cholesterol Cal: 21 mg/dL (ref 5–40)

## 2024-10-06 LAB — HEMOGLOBIN A1C
Est. average glucose Bld gHb Est-mCnc: 137 mg/dL
Hgb A1c MFr Bld: 6.4 % — ABNORMAL HIGH (ref 4.8–5.6)

## 2024-10-10 ENCOUNTER — Other Ambulatory Visit: Payer: Self-pay | Admitting: Internal Medicine

## 2024-10-12 ENCOUNTER — Ambulatory Visit: Attending: Internal Medicine

## 2024-10-12 DIAGNOSIS — I2510 Atherosclerotic heart disease of native coronary artery without angina pectoris: Secondary | ICD-10-CM | POA: Diagnosis not present

## 2024-10-12 DIAGNOSIS — I4821 Permanent atrial fibrillation: Secondary | ICD-10-CM

## 2024-10-12 DIAGNOSIS — I5022 Chronic systolic (congestive) heart failure: Secondary | ICD-10-CM | POA: Diagnosis not present

## 2024-10-12 LAB — ECHOCARDIOGRAM COMPLETE
AR max vel: 0.32 cm2
AV Area VTI: 0.29 cm2
AV Area mean vel: 0.31 cm2
AV Mean grad: 22.2 mmHg
AV Peak grad: 38.7 mmHg
Ao pk vel: 3.11 m/s
MV VTI: 0.83 cm2
S' Lateral: 2.5 cm

## 2024-10-16 ENCOUNTER — Ambulatory Visit: Admitting: Student

## 2024-10-17 NOTE — Progress Notes (Unsigned)
 "  Cardiology Clinic Note   Date: 10/17/2024 ID: Karen Cunningham 1931/03/28, MRN [id number]  Primary Cardiologist:  [name], MD  Chief Complaint   Karen Cunningham is a 89 y.o. female who presents to the clinic today for ***  Patient Profile   Karen Cunningham is followed by Dr. Hanson for the history outlined below.      Past medical history significant for: CAD. PCI with stent to mid LAD 2010. Nuclear stress test 10/08/2017: Normal test. Chronic HFrEF with improved LV function/aortic valve stenosis. Echo 05/01/2024: EF 25 to 30%.  Moderately reduced RV function, normal RV size.  Normal PA pressure.  Moderate LAE.  Severe RAE.  Moderate pleural effusion in the left lateral region.  Mild to moderate MR, severe MAC.  Mild to moderate TR.  Aortic valve with indeterminate number of cusps.  Mild calcification of aortic valve, moderate thickening.  Moderate to severe low-flow/low gradient aortic valve stenosis, mean gradient 11 mmHg. Echo 10/12/2024: EF 55 to 60%.  No RWMA.  Indeterminate diastolic parameters.  Moderately reduced RV function, mild RVH.  Normal PA pressure, RVSP 32.9 mmHg.  Mild LAE.  Moderate RAE.  Mild to moderate MR.  Moderate MAC.  Calcified aortic valve.  Moderate aortic stenosis, mean gradient 22.2 mmHg.  No evidence of pericardial effusion. Per Dr. Hanson: Echo shows normalization of LVEF, raising possibility to prior cardiomyopathy and troponin elevation of the summer could have been related to Takotsubo.. Aortic stenosis read as moderate, however, calculated AVA and dimensionless index are consistent with severe paradoxical low-flow/low-gradient AS. Aortic valve motion on review of 2D images does not appear critically restricted. Permanent afib. Onset 2017. 14-day Zio 12/20/2019: HR 40 to 182 bpm, average 84 bpm.  100% A-fib burden.  Rare PVCs.  Single 9 beat run of NSVT with max rate 156 bpm.  No prolonged pauses. PAD. Hyperlipidemia. Lipid panel 10/05/2024: LDL 63,  HDL 53, TG 118, total 137. Hypothyroidism. CKD stage III.  In summary, patient was previously followed by Dr. Deretha. She has a history of CAD with PCI in 2010.  Nuclear stress testing in January 2019 was normal.  She has permanent A-fib with onset in 2017.  She was started on Coumadin  for anticoagulation.  Cardioversion was recommended but deferred in favor of rate control.  She establish care with Dr. Hanson in December 2020.  14-day Zio March 2021 demonstrated 100% A-fib burden.  Echo at that time demonstrated low normal LV function, Grade II DD, normal RV size/function, mildly elevated PA pressure, RVSP 39.4 mmHg, severe BAE, mild MR, mild aortic stenosis mean gradient 10.3 mmHg.  Patient underwent hospitalization in August 2025 presenting with STEMI complicated by acute HFrEF and A-fib with RVR.  Echo at that time demonstrated EF 25 to 30%.  She was ultimately discharged with home hospice.  At follow-up visit in November digoxin  was discontinued secondary to concerns for potential toxicity including persistent headaches.  Family contacted the office several times throughout the month with concerns of elevated heart rate and dyspnea.  She was seen by APP and reported resuming digoxin  2 days prior.  Patient was last seen in the office by Dr. Hanson on 09/20/2024.  She reported palpitations and breathing improved with resumption of digoxin .  Chronic DOE had returned to baseline.  Patient's daughter-in-law noted hospice nurse commented on mild lower extremity edema.  Most recent INR check was subtherapeutic at 1.8.  EKG demonstrated A-fib 83 bpm.  Repeat echo (detailed above) showed recovered  LV function and it was presumed cardiomyopathy could have been related to Takotsubo.     History of Present Illness    Today, patient ***  CAD S/p PCI to mid LAD 2010.  Nuclear stress test January 2019 was normal.  STEMI August 2025 felt to be possible Takotsubo.  Heart catheterization was not pursued during hospital  admission.  Patient*** - Continue metoprolol , atorvastatin .  Not on aspirin  secondary to Coumadin .  Cardiomyopathy/aortic valve stenosis Echo January 2026 demonstrated EF 55 to 60%, no RWMA, moderately reduced RV function, mild RVH, normal PA pressure, mild to moderate MR, moderate MAC, calcified aortic valve with moderate aortic stenosis mean gradient 22.2 mmHg.  Per Dr. Mady aortic stenosis read as moderate however calculated AVA and dimensionless index are consistent with severe paradoxical low-flow/low gradient AS, aortic valve motion did not appear critically restricted.  Patient*** - Continue metoprolol .  GDMT limited secondary to soft BP*** - Schedule L/RHC***  Permanent A-fib Onset 2017.  14-day Zio March 2021 showed 100% A-fib burden.  Denies spontaneous bleeding concerns.  Patient***EKG*** - Continue metoprolol , digoxin , Coumadin .  Hyperlipidemia LDL 63 January 2026, at goal. - Continue atorvastatin .  ROS: All other systems reviewed and are otherwise negative except as noted in History of Present Illness.  EKGs/Labs Reviewed        10/05/2024: ALT 19; AST 33; BUN 14; Creatinine, Ser 0.87; Potassium 4.5; Sodium 142   10/05/2024: Hemoglobin 12.8; WBC 8.2   10/05/2024: TSH 3.060   05/01/2024: B Natriuretic Peptide 955.8  ***  Risk Assessment/Calculations    {Does this patient have ATRIAL FIBRILLATION?:[phone]} No BP recorded.  {Refresh Note OR Click here to enter BP  :1}***        Physical Exam    VS:  LMP  (LMP Unknown)  , BMI There is no height or weight on file to calculate BMI.  GEN: Well nourished, well developed, in no acute distress. Neck: No JVD or carotid bruits. Cardiac: *** RRR. *** No murmur. No rubs or gallops.   Respiratory:  Respirations regular and unlabored. Clear to auscultation without rales, wheezing or rhonchi. GI: Soft, nontender, nondistended. Extremities: Radials/DP/PT 2+ and equal bilaterally. No clubbing or cyanosis. No edema ***  Skin:  Warm and dry, no rash. Neuro: Strength intact.  Assessment & Plan   ***  Disposition: ***     {Are you ordering a CV Procedure (e.g. stress test, cath, DCCV, TEE, etc)?   Press F2        :[id number]}   Signed, Barnie HERO. Rollande Thursby, DNP, NP-C  "

## 2024-10-19 ENCOUNTER — Ambulatory Visit: Admitting: Student

## 2024-10-23 NOTE — Progress Notes (Unsigned)
 "  Cardiology Clinic Note   Date: 10/23/2024 ID: Karen, Cunningham 24-Jan-1931, MRN [id number]  Primary Cardiologist:  [name], MD  Chief Complaint   Karen Cunningham is a 89 y.o. female who presents to the clinic today for ***  Patient Profile   Karen Cunningham is followed by Dr. Hanson for the history outlined below.      Past medical history significant for: CAD. PCI with stent to mid LAD 2010. Nuclear stress test 10/08/2017: Normal test. Chronic HFrEF with improved LV function/aortic valve stenosis. Echo 05/01/2024: EF 25 to 30%.  Moderately reduced RV function, normal RV size.  Normal PA pressure.  Moderate LAE.  Severe RAE.  Moderate pleural effusion in the left lateral region.  Mild to moderate MR, severe MAC.  Mild to moderate TR.  Aortic valve with indeterminate number of cusps.  Mild calcification of aortic valve, moderate thickening.  Moderate to severe low-flow/low gradient aortic valve stenosis, mean gradient 11 mmHg. Echo 10/12/2024: EF 55 to 60%.  No RWMA.  Indeterminate diastolic parameters.  Moderately reduced RV function, mild RVH.  Normal PA pressure, RVSP 32.9 mmHg.  Mild LAE.  Moderate RAE.  Mild to moderate MR.  Moderate MAC.  Calcified aortic valve.  Moderate aortic stenosis, mean gradient 22.2 mmHg.  No evidence of pericardial effusion. Per Dr. Hanson: Echo shows normalization of LVEF, raising possibility to prior cardiomyopathy and troponin elevation of the summer could have been related to Takotsubo.. Aortic stenosis read as moderate, however, calculated AVA and dimensionless index are consistent with severe paradoxical low-flow/low-gradient AS. Aortic valve motion on review of 2D images does not appear critically restricted. Permanent afib. Onset 2017. 14-day Zio 12/20/2019: HR 40 to 182 bpm, average 84 bpm.  100% A-fib burden.  Rare PVCs.  Single 9 beat run of NSVT with max rate 156 bpm.  No prolonged pauses. PAD. Hyperlipidemia. Lipid panel 10/05/2024: LDL 63, HDL  53, TG 118, total 137. Hypothyroidism. CKD stage III.  In summary, patient was previously followed by Dr. Deretha. She has a history of CAD with PCI in 2010.  Nuclear stress testing in January 2019 was normal.  She has permanent A-fib with onset in 2017.  She was started on Coumadin  for anticoagulation.  Cardioversion was recommended but deferred in favor of rate control.  She establish care with Dr. Hanson in December 2020.  14-day Zio March 2021 demonstrated 100% A-fib burden.  Echo at that time demonstrated low normal LV function, Grade II DD, normal RV size/function, mildly elevated PA pressure, RVSP 39.4 mmHg, severe BAE, mild MR, mild aortic stenosis mean gradient 10.3 mmHg.  Patient underwent hospitalization in August 2025 presenting with STEMI complicated by acute HFrEF and A-fib with RVR.  Echo at that time demonstrated EF 25 to 30%.  She was ultimately discharged with home hospice.  At follow-up visit in November digoxin  was discontinued secondary to concerns for potential toxicity including persistent headaches.  Family contacted the office several times throughout the month with concerns of elevated heart rate and dyspnea.  She was seen by APP and reported resuming digoxin  2 days prior.  Patient was last seen in the office by Dr. Hanson on 09/20/2024.  She reported palpitations and breathing improved with resumption of digoxin .  Chronic DOE had returned to baseline.  Patient's daughter-in-law noted hospice nurse commented on mild lower extremity edema.  Most recent INR check was subtherapeutic at 1.8.  EKG demonstrated A-fib 83 bpm.  Repeat echo (detailed above) showed recovered  LV function and it was presumed cardiomyopathy could have been related to Takotsubo.     History of Present Illness    Today, patient ***  CAD S/p PCI to mid LAD 2010.  Nuclear stress test January 2019 was normal.  STEMI August 2025 felt to be possible Takotsubo.  Heart catheterization was not pursued during hospital  admission.  Patient*** - Continue metoprolol , atorvastatin .  Not on aspirin  secondary to Coumadin .  Cardiomyopathy/aortic valve stenosis Echo January 2026 demonstrated EF 55 to 60%, no RWMA, moderately reduced RV function, mild RVH, normal PA pressure, mild to moderate MR, moderate MAC, calcified aortic valve with moderate aortic stenosis mean gradient 22.2 mmHg.  Per Dr. Mady aortic stenosis read as moderate however calculated AVA and dimensionless index are consistent with severe paradoxical low-flow/low gradient AS, aortic valve motion did not appear critically restricted.  Patient*** - Continue metoprolol .  GDMT limited secondary to soft BP*** - Schedule L/RHC***  Permanent A-fib Onset 2017.  14-day Zio March 2021 showed 100% A-fib burden.  Denies spontaneous bleeding concerns.  Patient***EKG*** - Continue metoprolol , digoxin , Coumadin .  Hyperlipidemia LDL 63 January 2026, at goal. - Continue atorvastatin .  ROS: All other systems reviewed and are otherwise negative except as noted in History of Present Illness.  EKGs/Labs Reviewed        10/05/2024: ALT 19; AST 33; BUN 14; Creatinine, Ser 0.87; Potassium 4.5; Sodium 142   10/05/2024: Hemoglobin 12.8; WBC 8.2   10/05/2024: TSH 3.060   05/01/2024: B Natriuretic Peptide 955.8  ***  Risk Assessment/Calculations    {Does this patient have ATRIAL FIBRILLATION?:[phone]} No BP recorded.  {Refresh Note OR Click here to enter BP  :1}***        Physical Exam    VS:  LMP  (LMP Unknown)  , BMI There is no height or weight on file to calculate BMI.  GEN: Well nourished, well developed, in no acute distress. Neck: No JVD or carotid bruits. Cardiac: *** RRR. *** No murmur. No rubs or gallops.   Respiratory:  Respirations regular and unlabored. Clear to auscultation without rales, wheezing or rhonchi. GI: Soft, nontender, nondistended. Extremities: Radials/DP/PT 2+ and equal bilaterally. No clubbing or cyanosis. No edema ***  Skin:  Warm and dry, no rash. Neuro: Strength intact.  Assessment & Plan   ***  Disposition: ***     {Are you ordering a CV Procedure (e.g. stress test, cath, DCCV, TEE, etc)?   Press F2        :[id number]}   Signed, Barnie HERO. Lamona Eimer, DNP, NP-C  "

## 2024-10-30 ENCOUNTER — Ambulatory Visit: Admitting: Student

## 2024-11-03 ENCOUNTER — Ambulatory Visit: Admitting: Podiatry

## 2025-04-05 ENCOUNTER — Ambulatory Visit: Payer: PRIVATE HEALTH INSURANCE | Admitting: Internal Medicine
